# Patient Record
Sex: Female | Born: 1975 | Hispanic: No
Health system: Southern US, Community
[De-identification: ages and names within clinical notes are randomized; demographics above are authoritative.]

## PROBLEM LIST (undated history)

## (undated) DIAGNOSIS — J189 Pneumonia, unspecified organism: Secondary | ICD-10-CM

## (undated) DIAGNOSIS — K7010 Alcoholic hepatitis without ascites: Secondary | ICD-10-CM

## (undated) DIAGNOSIS — A159 Respiratory tuberculosis unspecified: Secondary | ICD-10-CM

---

## 2008-01-31 HISTORY — PX: OTHER SURGICAL HISTORY: SHX169

## 2016-02-14 ENCOUNTER — Ambulatory Visit
Admission: RE | Admit: 2016-02-14 | Discharge: 2016-02-14 | Disposition: A | Discharge status: Home / Self Care | Payer: No Typology Code available for payment source | Source: Ambulatory Visit | Attending: Infectious Disease | Admitting: Infectious Disease

## 2016-02-14 ENCOUNTER — Other Ambulatory Visit: Payer: Self-pay | Admitting: Infectious Disease

## 2016-02-14 DIAGNOSIS — Z0289 Encounter for other administrative examinations: Secondary | ICD-10-CM

## 2017-05-03 ENCOUNTER — Emergency Department (HOSPITAL_COMMUNITY): Payer: Medicaid Other

## 2017-05-03 ENCOUNTER — Inpatient Hospital Stay (HOSPITAL_COMMUNITY)
Admission: EM | Admit: 2017-05-03 | Discharge: 2017-05-07 | DRG: 865 | Disposition: A | Discharge status: Home / Self Care | Payer: Medicaid Other | Attending: Internal Medicine | Admitting: Internal Medicine

## 2017-05-03 ENCOUNTER — Encounter (HOSPITAL_COMMUNITY): Payer: Self-pay | Admitting: Emergency Medicine

## 2017-05-03 DIAGNOSIS — D696 Thrombocytopenia, unspecified: Secondary | ICD-10-CM | POA: Diagnosis present

## 2017-05-03 DIAGNOSIS — Z8611 Personal history of tuberculosis: Secondary | ICD-10-CM

## 2017-05-03 DIAGNOSIS — Z720 Tobacco use: Secondary | ICD-10-CM

## 2017-05-03 DIAGNOSIS — R109 Unspecified abdominal pain: Secondary | ICD-10-CM | POA: Diagnosis present

## 2017-05-03 DIAGNOSIS — D689 Coagulation defect, unspecified: Secondary | ICD-10-CM | POA: Diagnosis present

## 2017-05-03 DIAGNOSIS — K769 Liver disease, unspecified: Secondary | ICD-10-CM | POA: Diagnosis present

## 2017-05-03 DIAGNOSIS — E7220 Disorder of urea cycle metabolism, unspecified: Secondary | ICD-10-CM | POA: Diagnosis present

## 2017-05-03 DIAGNOSIS — E872 Acidosis, unspecified: Secondary | ICD-10-CM

## 2017-05-03 DIAGNOSIS — Z6820 Body mass index (BMI) 20.0-20.9, adult: Secondary | ICD-10-CM

## 2017-05-03 DIAGNOSIS — F1020 Alcohol dependence, uncomplicated: Secondary | ICD-10-CM | POA: Diagnosis present

## 2017-05-03 DIAGNOSIS — W228XXA Striking against or struck by other objects, initial encounter: Secondary | ICD-10-CM | POA: Diagnosis present

## 2017-05-03 DIAGNOSIS — R0789 Other chest pain: Secondary | ICD-10-CM | POA: Diagnosis present

## 2017-05-03 DIAGNOSIS — E874 Mixed disorder of acid-base balance: Secondary | ICD-10-CM | POA: Diagnosis present

## 2017-05-03 DIAGNOSIS — R319 Hematuria, unspecified: Secondary | ICD-10-CM | POA: Diagnosis present

## 2017-05-03 DIAGNOSIS — E781 Pure hyperglyceridemia: Secondary | ICD-10-CM | POA: Diagnosis present

## 2017-05-03 DIAGNOSIS — R7989 Other specified abnormal findings of blood chemistry: Secondary | ICD-10-CM | POA: Diagnosis present

## 2017-05-03 DIAGNOSIS — R402413 Glasgow coma scale score 13-15, at hospital admission: Secondary | ICD-10-CM | POA: Diagnosis present

## 2017-05-03 DIAGNOSIS — D61818 Other pancytopenia: Secondary | ICD-10-CM | POA: Diagnosis present

## 2017-05-03 DIAGNOSIS — E876 Hypokalemia: Secondary | ICD-10-CM | POA: Diagnosis present

## 2017-05-03 DIAGNOSIS — R079 Chest pain, unspecified: Secondary | ICD-10-CM | POA: Diagnosis present

## 2017-05-03 DIAGNOSIS — D735 Infarction of spleen: Secondary | ICD-10-CM | POA: Diagnosis present

## 2017-05-03 DIAGNOSIS — K760 Fatty (change of) liver, not elsewhere classified: Secondary | ICD-10-CM | POA: Diagnosis present

## 2017-05-03 DIAGNOSIS — R748 Abnormal levels of other serum enzymes: Secondary | ICD-10-CM

## 2017-05-03 DIAGNOSIS — A419 Sepsis, unspecified organism: Secondary | ICD-10-CM | POA: Diagnosis present

## 2017-05-03 DIAGNOSIS — R945 Abnormal results of liver function studies: Secondary | ICD-10-CM | POA: Diagnosis present

## 2017-05-03 DIAGNOSIS — G9341 Metabolic encephalopathy: Secondary | ICD-10-CM | POA: Diagnosis present

## 2017-05-03 DIAGNOSIS — R6510 Systemic inflammatory response syndrome (SIRS) of non-infectious origin without acute organ dysfunction: Secondary | ICD-10-CM | POA: Diagnosis present

## 2017-05-03 DIAGNOSIS — R29700 NIHSS score 0: Secondary | ICD-10-CM | POA: Diagnosis present

## 2017-05-03 DIAGNOSIS — E440 Moderate protein-calorie malnutrition: Secondary | ICD-10-CM | POA: Diagnosis present

## 2017-05-03 DIAGNOSIS — B349 Viral infection, unspecified: Principal | ICD-10-CM | POA: Diagnosis present

## 2017-05-03 HISTORY — DX: Respiratory tuberculosis unspecified: A15.9

## 2017-05-03 HISTORY — DX: Pneumonia, unspecified organism: J18.9

## 2017-05-03 LAB — COMPREHENSIVE METABOLIC PANEL
ALBUMIN: 2.7 g/dL — AB (ref 3.5–5.0)
ALK PHOS: 287 U/L — AB (ref 38–126)
ALT: 84 U/L — ABNORMAL HIGH (ref 14–54)
ANION GAP: 11 (ref 5–15)
AST: 301 U/L — ABNORMAL HIGH (ref 15–41)
BUN: 6 mg/dL (ref 6–20)
CALCIUM: 7.8 mg/dL — AB (ref 8.9–10.3)
CO2: 14 mmol/L — AB (ref 22–32)
Chloride: 106 mmol/L (ref 101–111)
Creatinine, Ser: 0.81 mg/dL (ref 0.44–1.00)
GFR calc Af Amer: >60 mL/min (ref >60)
GFR calc non Af Amer: >60 mL/min (ref >60)
Glucose, Bld: 116 mg/dL — ABNORMAL HIGH (ref 65–99)
POTASSIUM: 3.3 mmol/L — AB (ref 3.5–5.1)
SODIUM: 131 mmol/L — AB (ref 135–145)
TOTAL PROTEIN: 6.2 g/dL — AB (ref 6.5–8.1)
Total Bilirubin: 4.4 mg/dL — ABNORMAL HIGH (ref 0.3–1.2)

## 2017-05-03 LAB — AMMONIA: Ammonia: 60 umol/L — ABNORMAL HIGH (ref 9–35)

## 2017-05-03 LAB — I-STAT CG4 LACTIC ACID, ED
LACTIC ACID, VENOUS: 4.01 mmol/L — AB (ref 0.5–1.9)
LACTIC ACID, VENOUS: 2.57 mmol/L — AB (ref 0.5–1.9)
Lactic Acid, Venous: 2.85 mmol/L (ref 0.5–1.9)

## 2017-05-03 LAB — CBC WITH DIFFERENTIAL/PLATELET
BASOS PCT: 1 %
Basophils Absolute: 0.1 10*3/uL (ref 0.0–0.1)
Eosinophils Absolute: 0 10*3/uL (ref 0.0–0.7)
Eosinophils Relative: 0 %
HEMATOCRIT: 32.3 % — AB (ref 36.0–46.0)
HEMOGLOBIN: 11.5 g/dL — AB (ref 12.0–15.0)
LYMPHS PCT: 48 %
Lymphs Abs: 2.3 10*3/uL (ref 0.7–4.0)
MCH: 31.6 pg (ref 26.0–34.0)
MCHC: 35.6 g/dL (ref 30.0–36.0)
MCV: 88.7 fL (ref 78.0–100.0)
MONOS PCT: 12 %
Monocytes Absolute: 0.6 10*3/uL (ref 0.1–1.0)
NEUTROS ABS: 1.9 10*3/uL (ref 1.7–7.7)
Neutrophils Relative %: 39 %
Platelets: 102 10*3/uL — ABNORMAL LOW (ref 150–400)
RBC: 3.64 MIL/uL — ABNORMAL LOW (ref 3.87–5.11)
RDW: 19.9 % — ABNORMAL HIGH (ref 11.5–15.5)
WBC: 4.8 10*3/uL (ref 4.0–10.5)

## 2017-05-03 LAB — I-STAT TROPONIN, ED
TROPONIN I, POC: 0 ng/mL (ref 0.00–0.08)
Troponin i, poc: 0 ng/mL (ref 0.00–0.08)

## 2017-05-03 LAB — I-STAT BETA HCG BLOOD, ED (MC, WL, AP ONLY): I-stat hCG, quantitative: <5 m[IU]/mL (ref <5)

## 2017-05-03 MED ORDER — PIPERACILLIN-TAZOBACTAM 3.375 G IVPB
3.3750 g | Freq: Three times a day (TID) | INTRAVENOUS | Status: DC
  Administered 2017-05-04 (×2): 3.375 g via INTRAVENOUS
  Filled 2017-05-03 (×2): qty 50

## 2017-05-03 MED ORDER — SODIUM CHLORIDE 0.9 % IV BOLUS (SEPSIS)
500.0000 mL | Freq: Once | INTRAVENOUS | Status: AC
  Administered 2017-05-03: 500 mL via INTRAVENOUS

## 2017-05-03 MED ORDER — VANCOMYCIN HCL IN DEXTROSE 1-5 GM/200ML-% IV SOLN
1000.0000 mg | Freq: Once | INTRAVENOUS | Status: AC
  Administered 2017-05-03: 1000 mg via INTRAVENOUS
  Filled 2017-05-03: qty 200

## 2017-05-03 MED ORDER — PIPERACILLIN-TAZOBACTAM 3.375 G IVPB 30 MIN
3.3750 g | Freq: Once | INTRAVENOUS | Status: AC
  Administered 2017-05-03: 3.375 g via INTRAVENOUS
  Filled 2017-05-03: qty 50

## 2017-05-03 MED ORDER — VANCOMYCIN HCL 500 MG IV SOLR
500.0000 mg | Freq: Two times a day (BID) | INTRAVENOUS | Status: DC
  Administered 2017-05-04: 500 mg via INTRAVENOUS
  Filled 2017-05-03 (×3): qty 500

## 2017-05-03 MED ORDER — SODIUM CHLORIDE 0.9 % IV BOLUS (SEPSIS)
1000.0000 mL | Freq: Once | INTRAVENOUS | Status: AC
  Administered 2017-05-03: 1000 mL via INTRAVENOUS

## 2017-05-03 NOTE — ED Notes (Signed)
Dr. Fredderick PhenixBelfi aware of Lactic 4.01

## 2017-05-03 NOTE — ED Notes (Signed)
ED Provider at bedside. 

## 2017-05-03 NOTE — Progress Notes (Addendum)
Pharmacy Antibiotic Note  Susan Mcconnell is a 42 y.o. female admitted on 05/03/2017 with sepsis.  Pharmacy has been consulted for vancomycin and Zosyn dosing.  Tm 99.2, WBC 4.8, LA 4.01  Plan: Vancomycin 1 g IV loading dose Vancomycin 500 mg IV every 12 hours.  Goal trough 15-20 mcg/mL. Zosyn 3.375g IV q8h (4 hour infusion).  F/u cultures, LOT, and VT prn  Height: 5\' 2"  (157.5 cm) Weight: 110 lb (49.9 kg) IBW/kg (Calculated) : 50.1  Temp (24hrs), Avg:98.8 F (37.1 C), Min:98.4 F (36.9 C), Max:99.2 F (37.3 C)  Recent Labs  Lab 05/03/17 2010 05/03/17 2043 05/03/17 2139 05/03/17 2255  WBC 4.8  --   --   --   CREATININE 0.81  --   --   --   LATICACIDVEN  --  4.01* 2.85* 2.57*    Estimated Creatinine Clearance: 72.7 mL/min (by C-G formula based on SCr of 0.81 mg/dL).    No Known Allergies  Antimicrobials this admission: Vancomycin 4/4 >> Zosyn 4/4 >>  Dose adjustments this admission: none  Microbiology results: Bcx collected   Ladell PierBrooke Arnetia Bronk, PharmD Pharmacy Resident Clinical Phone for 05/03/2017 until 3:30pm: [phone] If after 3:30pm, please call main pharmacy at [phone] 05/03/2017 11:04 PM

## 2017-05-03 NOTE — ED Triage Notes (Signed)
Pt is poor historian, speaks [place]. Pt states "I am here because I don't feel good" Pt will not state how/why she doesn't feel good. Only states this has happened before when she lived in [place] and was admitted for IV fluids.

## 2017-05-03 NOTE — ED Provider Notes (Signed)
[facility] EMERGENCY DEPARTMENT Provider Note   CSN: [id number] Arrival date & time: 05/03/17  4098     History   Chief Complaint Chief Complaint  Patient presents with  . Generalized Body Aches    HPI Susan Mcconnell is a 42 y.o. female.  HPI 42 year old female who is a poor historian with history obtained from interpreter presents to the ED for evaluation of "not feeling well".  Patient states that "she does not want to tell me what is going on".  Patient's son in the room is baking patient to tell me what is going on however patient refuses.  She states that "she does not have a job and has no money and just wants IV fluids".  Son does report that patient has been complaining of a fever for the past 4 days.  States that she has had some complaints of lower chest pain and upper abdominal pain.  Patient cannot give me any more information concerning this.  Patient has been living in [place] for the past year.  She is originally from [place].  Patient does state that "she has had tuberculosis before in [place] and was treated several years ago".  Patient denies any new cough.  Denies any urinary symptoms, change in bowel habits.  Denies shortness of breath, vomiting, diarrhea, urinary symptoms.  States that she had to be hospitalized in [place] for same thing several years ago for IV fluids. History reviewed. No pertinent past medical history.  There are no active problems to display for this patient.   History reviewed. No pertinent surgical history.   OB History   None      Home Medications    Prior to Admission medications   Not on File    Family History No family history on file.  Social History Social History   Tobacco Use  . Smoking status: Not on file  Substance Use Topics  . Alcohol use: Not on file  . Drug use: Not on file     Allergies   Patient has no known allergies.   Review of Systems Review of Systems  Constitutional: Positive for  chills and fever.  HENT: Negative for congestion.   Respiratory: Negative for cough and shortness of breath.   Cardiovascular: Positive for chest pain.  Gastrointestinal: Positive for abdominal pain. Negative for diarrhea, nausea and vomiting.  Genitourinary: Negative for dysuria, flank pain, frequency, hematuria and urgency.  Musculoskeletal: Negative for myalgias.  Skin: Negative for rash.  Neurological: Negative for dizziness, weakness, light-headedness and headaches.     Physical Exam Updated Vital Signs BP 97/62   Pulse 99   Temp 98.4 F (36.9 C) (Rectal) Comment: Pt felt extremely warm to touch. Oral temp yielded 98.4 prior to rectal being taken.   Resp (!) 24   Ht 5\' 2"  (1.575 m)   Wt 49.9 kg (110 lb)   SpO2 99%   BMI 20.12 kg/m   Physical Exam  Constitutional: She is oriented to person, place, and time. She appears well-developed and well-nourished.  Non-toxic appearance. No distress.  HENT:  Head: Normocephalic and atraumatic.  Nose: Nose normal.  Mouth/Throat: Oropharynx is clear and moist.  Patient does have what appears to be a healing ecchymosis to the left eyelid.  Eyes: Pupils are equal, round, and reactive to light. Conjunctivae and EOM are normal. Right eye exhibits no discharge. Left eye exhibits no discharge.  Neck: Normal range of motion. Neck supple.  Cardiovascular: Regular rhythm, normal heart sounds  and intact distal pulses. Exam reveals no gallop and no friction rub.  No murmur heard. Tachycardia  Pulmonary/Chest: Effort normal and breath sounds normal. No stridor. No respiratory distress. She has no wheezes. She has no rales. She exhibits no tenderness.  Tachypnea noted.  Patient is not hypoxic.  Abdominal: Soft. Bowel sounds are normal. There is no tenderness. There is no rebound and no guarding.  Musculoskeletal: Normal range of motion. She exhibits no tenderness.  Lymphadenopathy:    She has no cervical adenopathy.  Neurological: She is alert  and oriented to person, place, and time.  Alert and oriented x3.  Follow commands appropriate.  Answers questions appropriately.  Skin: Skin is warm and dry. Capillary refill takes less than 2 seconds.  Psychiatric: Her behavior is normal. Judgment and thought content normal.  Nursing note and vitals reviewed.    ED Treatments / Results  Labs (all labs ordered are listed, but only abnormal results are displayed) Labs Reviewed  COMPREHENSIVE METABOLIC PANEL - Abnormal; Notable for the following components:      Result Value   Sodium 131 (*)    Potassium 3.3 (*)    CO2 14 (*)    Glucose, Bld 116 (*)    Calcium 7.8 (*)    Total Protein 6.2 (*)    Albumin 2.7 (*)    AST 301 (*)    ALT 84 (*)    Alkaline Phosphatase 287 (*)    Total Bilirubin 4.4 (*)    All other components within normal limits  CBC WITH DIFFERENTIAL/PLATELET - Abnormal; Notable for the following components:   RBC 3.64 (*)    Hemoglobin 11.5 (*)    HCT 32.3 (*)    RDW 19.9 (*)    Platelets 102 (*)    All other components within normal limits  URINALYSIS, ROUTINE W REFLEX MICROSCOPIC - Abnormal; Notable for the following components:   Hgb urine dipstick LARGE (*)    Bacteria, UA RARE (*)    Squamous Epithelial / LPF 0-5 (*)    All other components within normal limits  AMMONIA - Abnormal; Notable for the following components:   Ammonia 60 (*)    All other components within normal limits  I-STAT CG4 LACTIC ACID, ED - Abnormal; Notable for the following components:   Lactic Acid, Venous 4.01 (*)    All other components within normal limits  I-STAT CG4 LACTIC ACID, ED - Abnormal; Notable for the following components:   Lactic Acid, Venous 2.85 (*)    All other components within normal limits  I-STAT CG4 LACTIC ACID, ED - Abnormal; Notable for the following components:   Lactic Acid, Venous 2.57 (*)    All other components within normal limits  I-STAT CG4 LACTIC ACID, ED - Abnormal; Notable for the following  components:   Lactic Acid, Venous 2.07 (*)    All other components within normal limits  CULTURE, BLOOD (ROUTINE X 2)  CULTURE, BLOOD (ROUTINE X 2)  LIPASE, BLOOD  INFLUENZA PANEL BY PCR (TYPE A & B)  HEPATITIS PANEL, ACUTE  I-STAT BETA HCG BLOOD, ED (MC, WL, AP ONLY)  I-STAT TROPONIN, ED  I-STAT TROPONIN, ED    EKG None  Radiology Dg Chest 2 View  Result Date: 05/03/2017 CLINICAL DATA:  Chest pain. EXAM: CHEST - 2 VIEW COMPARISON:  Chest x-ray dated February 14, 2016. FINDINGS: The heart size and mediastinal contours are within normal limits. Normal pulmonary vascularity. No focal consolidation, pleural effusion, or pneumothorax. No acute osseous  abnormality. IMPRESSION: No active cardiopulmonary disease. Electronically Signed   By: Obie DredgeWilliam T Derry M.D.   On: 05/03/2017 20:47    Procedures .Critical Care Performed by: Rise MuLeaphart, Kenneth T, PA-C Authorized by: Rise MuLeaphart, Kenneth T, PA-C   Critical care provider statement:    Critical care time (minutes):  45   Critical care was necessary to treat or prevent imminent or life-threatening deterioration of the following conditions:  Sepsis   Critical care was time spent personally by me on the following activities:  Development of treatment plan with patient or surrogate, discussions with consultants, discussions with primary provider, evaluation of patient's response to treatment, examination of patient, obtaining history from patient or surrogate, ordering and performing treatments and interventions, ordering and review of laboratory studies, ordering and review of radiographic studies, pulse oximetry, re-evaluation of patient's condition and review of old charts   (including critical care time)  Medications Ordered in ED Medications  vancomycin (VANCOCIN) 500 mg in sodium chloride 0.9 % 100 mL IVPB (has no administration in time range)  piperacillin-tazobactam (ZOSYN) IVPB 3.375 g (has no administration in time range)  sodium  chloride 0.9 % bolus 1,000 mL (0 mLs Intravenous Stopped 05/03/17 2334)    And  sodium chloride 0.9 % bolus 500 mL (0 mLs Intravenous Stopped 05/03/17 2335)  piperacillin-tazobactam (ZOSYN) IVPB 3.375 g (0 g Intravenous Stopped 05/03/17 2335)  vancomycin (VANCOCIN) IVPB 1000 mg/200 mL premix (0 mg Intravenous Stopped 05/04/17 0014)     Initial Impression / Assessment and Plan / ED Course  I have reviewed the triage vital signs and the nursing notes.  Pertinent labs & imaging results that were available during my care of the patient were reviewed by me and considered in my medical decision making (see chart for details).     Patient presents to the ED with vague complaints of chest pain, "not feeling well".  Patient has not been very forthcoming with presentation.  States that she has been admitted for dehydration in the past.  Patient also reports intermittent fevers.  Patient also states she has a history of tuberculosis several years ago that was treated.  Denies any new cough or weight loss.  Patient initially tachycardic with low-grade temperature.  Patient's blood pressure has been soft in the low 90s systolic.  She is tachypneic.  No hypoxia noted.  Exam patient is is very thin appearing.  Lungs clear to auscultation bilaterally.  No focal abdominal tenderness.  Neurovascularly intact in all extremities.  In triage patient's lactic acid was 4.01.  This improved without any intervention at 2.8.  Patient remains tachycardic and tachypneic.  Code sepsis was initiated.  Patient given 30 cc/kg fluid bolus.  Broad-spectrum antibiotics were initiated.  Blood cultures obtained prior to antibiotic initiation.  Lab work reveals no leukocytosis.  Hemoglobin 11.5.  Negative troponin.  Ammonia mildly elevated at 60.  Patient's liver enzymes are slightly elevated with a total bilirubin of 4.4.  Lipase is normal.  Hepatic function is pending.  UA does not show any signs of infection.  She does have large  amounts of hemoglobin in her urine.  Patient's heart rate has improved with fluids.  Her blood pressures remained stable in the low 90s systolic.  Ultrasound of the abdomen did not reveal any dilated bile duct.  Mild liver infiltrate noted.  No other acute abnormalities noted.  Chest x-ray showed no signs of focal infiltrate or concerning signs of cavitary lesions.  Patient stable at this time with IV fluids and  antibiotics.  Patient is agreed to admission.  Spoke with Dr. Youlanda Roys with hospital medicine who agrees to admission will see patient in the ED and place admission orders.   Final Clinical Impressions(s) / ED Diagnoses   Final diagnoses:  Sepsis, due to unspecified organism [facility])  Elevated liver enzymes    ED Discharge Orders    None       Wallace Keller 05/04/17 4098    Rolan Bucco, MD 05/04/17 1745

## 2017-05-04 ENCOUNTER — Encounter (HOSPITAL_COMMUNITY): Payer: Self-pay | Admitting: Internal Medicine

## 2017-05-04 ENCOUNTER — Inpatient Hospital Stay (HOSPITAL_COMMUNITY): Payer: Medicaid Other

## 2017-05-04 ENCOUNTER — Other Ambulatory Visit: Payer: Self-pay

## 2017-05-04 DIAGNOSIS — R6510 Systemic inflammatory response syndrome (SIRS) of non-infectious origin without acute organ dysfunction: Secondary | ICD-10-CM | POA: Diagnosis present

## 2017-05-04 DIAGNOSIS — A419 Sepsis, unspecified organism: Secondary | ICD-10-CM | POA: Diagnosis present

## 2017-05-04 DIAGNOSIS — E874 Mixed disorder of acid-base balance: Secondary | ICD-10-CM | POA: Diagnosis present

## 2017-05-04 DIAGNOSIS — R748 Abnormal levels of other serum enzymes: Secondary | ICD-10-CM | POA: Diagnosis not present

## 2017-05-04 DIAGNOSIS — E440 Moderate protein-calorie malnutrition: Secondary | ICD-10-CM | POA: Diagnosis present

## 2017-05-04 DIAGNOSIS — G9341 Metabolic encephalopathy: Secondary | ICD-10-CM | POA: Diagnosis present

## 2017-05-04 DIAGNOSIS — E876 Hypokalemia: Secondary | ICD-10-CM | POA: Diagnosis present

## 2017-05-04 DIAGNOSIS — R109 Unspecified abdominal pain: Secondary | ICD-10-CM | POA: Diagnosis present

## 2017-05-04 DIAGNOSIS — E781 Pure hyperglyceridemia: Secondary | ICD-10-CM | POA: Diagnosis present

## 2017-05-04 DIAGNOSIS — K769 Liver disease, unspecified: Secondary | ICD-10-CM | POA: Diagnosis present

## 2017-05-04 DIAGNOSIS — Z6820 Body mass index (BMI) 20.0-20.9, adult: Secondary | ICD-10-CM | POA: Diagnosis not present

## 2017-05-04 DIAGNOSIS — R945 Abnormal results of liver function studies: Secondary | ICD-10-CM | POA: Diagnosis present

## 2017-05-04 DIAGNOSIS — R29700 NIHSS score 0: Secondary | ICD-10-CM | POA: Diagnosis present

## 2017-05-04 DIAGNOSIS — Z720 Tobacco use: Secondary | ICD-10-CM | POA: Diagnosis not present

## 2017-05-04 DIAGNOSIS — K760 Fatty (change of) liver, not elsewhere classified: Secondary | ICD-10-CM | POA: Diagnosis present

## 2017-05-04 DIAGNOSIS — E7220 Disorder of urea cycle metabolism, unspecified: Secondary | ICD-10-CM | POA: Diagnosis present

## 2017-05-04 DIAGNOSIS — R319 Hematuria, unspecified: Secondary | ICD-10-CM | POA: Diagnosis present

## 2017-05-04 DIAGNOSIS — R0789 Other chest pain: Secondary | ICD-10-CM | POA: Diagnosis present

## 2017-05-04 DIAGNOSIS — B349 Viral infection, unspecified: Secondary | ICD-10-CM | POA: Diagnosis present

## 2017-05-04 DIAGNOSIS — D735 Infarction of spleen: Secondary | ICD-10-CM | POA: Diagnosis present

## 2017-05-04 DIAGNOSIS — R7989 Other specified abnormal findings of blood chemistry: Secondary | ICD-10-CM | POA: Diagnosis present

## 2017-05-04 DIAGNOSIS — D696 Thrombocytopenia, unspecified: Secondary | ICD-10-CM

## 2017-05-04 DIAGNOSIS — Z8611 Personal history of tuberculosis: Secondary | ICD-10-CM | POA: Diagnosis not present

## 2017-05-04 DIAGNOSIS — R079 Chest pain, unspecified: Secondary | ICD-10-CM | POA: Diagnosis present

## 2017-05-04 DIAGNOSIS — D689 Coagulation defect, unspecified: Secondary | ICD-10-CM | POA: Diagnosis present

## 2017-05-04 DIAGNOSIS — D61818 Other pancytopenia: Secondary | ICD-10-CM | POA: Diagnosis present

## 2017-05-04 DIAGNOSIS — R402413 Glasgow coma scale score 13-15, at hospital admission: Secondary | ICD-10-CM | POA: Diagnosis present

## 2017-05-04 DIAGNOSIS — F1020 Alcohol dependence, uncomplicated: Secondary | ICD-10-CM | POA: Diagnosis present

## 2017-05-04 DIAGNOSIS — W228XXA Striking against or struck by other objects, initial encounter: Secondary | ICD-10-CM | POA: Diagnosis present

## 2017-05-04 LAB — CBC
HCT: 27.4 % — ABNORMAL LOW (ref 36.0–46.0)
HCT: 29.4 % — ABNORMAL LOW (ref 36.0–46.0)
HEMOGLOBIN: 9.6 g/dL — AB (ref 12.0–15.0)
HEMOGLOBIN: 9.9 g/dL — AB (ref 12.0–15.0)
MCH: 30.7 pg (ref 26.0–34.0)
MCH: 30.1 pg (ref 26.0–34.0)
MCHC: 35 g/dL (ref 30.0–36.0)
MCHC: 33.7 g/dL (ref 30.0–36.0)
MCV: 87.5 fL (ref 78.0–100.0)
MCV: 89.4 fL (ref 78.0–100.0)
PLATELETS: 87 10*3/uL — AB (ref 150–400)
Platelets: 85 10*3/uL — ABNORMAL LOW (ref 150–400)
RBC: 3.13 MIL/uL — AB (ref 3.87–5.11)
RBC: 3.29 MIL/uL — AB (ref 3.87–5.11)
RDW: 20.2 % — ABNORMAL HIGH (ref 11.5–15.5)
RDW: 19.2 % — ABNORMAL HIGH (ref 11.5–15.5)
WBC: 2.2 10*3/uL — AB (ref 4.0–10.5)
WBC: 4.3 10*3/uL (ref 4.0–10.5)

## 2017-05-04 LAB — PROTIME-INR
INR: 1.35
PROTHROMBIN TIME: 16.6 s — AB (ref 11.4–15.2)

## 2017-05-04 LAB — PATHOLOGIST SMEAR REVIEW

## 2017-05-04 LAB — INFLUENZA PANEL BY PCR (TYPE A & B)
Influenza A By PCR: NEGATIVE
Influenza B By PCR: NEGATIVE

## 2017-05-04 LAB — COMPREHENSIVE METABOLIC PANEL
ALK PHOS: 225 U/L — AB (ref 38–126)
ALT: 63 U/L — AB (ref 14–54)
ANION GAP: 14 (ref 5–15)
AST: 207 U/L — ABNORMAL HIGH (ref 15–41)
Albumin: 2.5 g/dL — ABNORMAL LOW (ref 3.5–5.0)
BILIRUBIN TOTAL: 3.7 mg/dL — AB (ref 0.3–1.2)
BUN: 6 mg/dL (ref 6–20)
CALCIUM: 6.4 mg/dL — AB (ref 8.9–10.3)
CO2: 11 mmol/L — ABNORMAL LOW (ref 22–32)
CREATININE: 1.01 mg/dL — AB (ref 0.44–1.00)
Chloride: 108 mmol/L (ref 101–111)
Glucose, Bld: 108 mg/dL — ABNORMAL HIGH (ref 65–99)
Potassium: 3.6 mmol/L (ref 3.5–5.1)
Sodium: 133 mmol/L — ABNORMAL LOW (ref 135–145)
TOTAL PROTEIN: 5.5 g/dL — AB (ref 6.5–8.1)

## 2017-05-04 LAB — SALICYLATE LEVEL: Salicylate Lvl: <7 mg/dL (ref 2.8–30.0)

## 2017-05-04 LAB — TROPONIN I
Troponin I: <0.03 ng/mL (ref <0.03)
Troponin I: <0.03 ng/mL (ref <0.03)

## 2017-05-04 LAB — IRON AND TIBC
IRON: 107 ug/dL (ref 28–170)
Saturation Ratios: 99 % — ABNORMAL HIGH (ref 10.4–31.8)
TIBC: 108 ug/dL — ABNORMAL LOW (ref 250–450)
UIBC: 1 ug/dL

## 2017-05-04 LAB — URINALYSIS, ROUTINE W REFLEX MICROSCOPIC
Bilirubin Urine: NEGATIVE
GLUCOSE, UA: NEGATIVE mg/dL
Ketones, ur: NEGATIVE mg/dL
Leukocytes, UA: NEGATIVE
Nitrite: NEGATIVE
PROTEIN: NEGATIVE mg/dL
SPECIFIC GRAVITY, URINE: 1.005 (ref 1.005–1.030)
pH: 7 (ref 5.0–8.0)

## 2017-05-04 LAB — BLOOD GAS, ARTERIAL
Acid-base deficit: 12.8 mmol/L — ABNORMAL HIGH (ref 0.0–2.0)
BICARBONATE: 11.6 mmol/L — AB (ref 20.0–28.0)
DRAWN BY: 52061
FIO2: 21
O2 Saturation: 97.9 %
PATIENT TEMPERATURE: 98.6
pCO2 arterial: 20.8 mmHg — ABNORMAL LOW (ref 32.0–48.0)
pH, Arterial: 7.367 (ref 7.350–7.450)
pO2, Arterial: 108 mmHg (ref 83.0–108.0)

## 2017-05-04 LAB — RETICULOCYTES
RBC.: 3.32 MIL/uL — AB (ref 3.87–5.11)
Retic Count, Absolute: 76.4 10*3/uL (ref 19.0–186.0)
Retic Ct Pct: 2.3 % (ref 0.4–3.1)

## 2017-05-04 LAB — LIPID PANEL
Cholesterol: 287 mg/dL — ABNORMAL HIGH (ref 0–200)
HDL: <10 mg/dL — ABNORMAL LOW (ref >40)
LDL Cholesterol: UNDETERMINED mg/dL (ref 0–99)
Triglycerides: 1710 mg/dL — ABNORMAL HIGH (ref <150)
VLDL: UNDETERMINED mg/dL (ref 0–40)

## 2017-05-04 LAB — RAPID URINE DRUG SCREEN, HOSP PERFORMED
Amphetamines: NOT DETECTED
BARBITURATES: NOT DETECTED
BENZODIAZEPINES: NOT DETECTED
Cocaine: NOT DETECTED
Opiates: NOT DETECTED
TETRAHYDROCANNABINOL: NOT DETECTED

## 2017-05-04 LAB — BASIC METABOLIC PANEL
ANION GAP: 10 (ref 5–15)
BUN: 5 mg/dL — ABNORMAL LOW (ref 6–20)
CHLORIDE: 115 mmol/L — AB (ref 101–111)
CO2: 10 mmol/L — ABNORMAL LOW (ref 22–32)
Calcium: 5.9 mg/dL — CL (ref 8.9–10.3)
Creatinine, Ser: 0.84 mg/dL (ref 0.44–1.00)
GFR calc non Af Amer: >60 mL/min (ref >60)
Glucose, Bld: 126 mg/dL — ABNORMAL HIGH (ref 65–99)
POTASSIUM: 4.4 mmol/L (ref 3.5–5.1)
SODIUM: 135 mmol/L (ref 135–145)

## 2017-05-04 LAB — LIPASE, BLOOD: Lipase: 50 U/L (ref 11–51)

## 2017-05-04 LAB — APTT: APTT: 32 s (ref 24–36)

## 2017-05-04 LAB — LACTIC ACID, PLASMA
LACTIC ACID, VENOUS: 1.8 mmol/L (ref 0.5–1.9)
LACTIC ACID, VENOUS: 1.8 mmol/L (ref 0.5–1.9)

## 2017-05-04 LAB — MRSA PCR SCREENING: MRSA by PCR: NEGATIVE

## 2017-05-04 LAB — HIV ANTIBODY (ROUTINE TESTING W REFLEX): HIV Screen 4th Generation wRfx: NONREACTIVE

## 2017-05-04 LAB — FERRITIN: Ferritin: 1041 ng/mL — ABNORMAL HIGH (ref 11–307)

## 2017-05-04 LAB — SAVE SMEAR

## 2017-05-04 LAB — CORTISOL-AM, BLOOD: Cortisol - AM: >100 ug/dL — ABNORMAL HIGH (ref 6.7–22.6)

## 2017-05-04 LAB — MAGNESIUM: Magnesium: 1.9 mg/dL (ref 1.7–2.4)

## 2017-05-04 LAB — I-STAT CG4 LACTIC ACID, ED: LACTIC ACID, VENOUS: 2.07 mmol/L — AB (ref 0.5–1.9)

## 2017-05-04 LAB — HEMOGLOBIN A1C
Hgb A1c MFr Bld: 5.7 % — ABNORMAL HIGH (ref 4.8–5.6)
Mean Plasma Glucose: 116.89 mg/dL

## 2017-05-04 LAB — LACTATE DEHYDROGENASE: LDH: 272 U/L — ABNORMAL HIGH (ref 98–192)

## 2017-05-04 LAB — VITAMIN B12: Vitamin B-12: 425 pg/mL (ref 180–914)

## 2017-05-04 LAB — PROCALCITONIN: PROCALCITONIN: 0.9 ng/mL

## 2017-05-04 LAB — CBG MONITORING, ED: Glucose-Capillary: 128 mg/dL — ABNORMAL HIGH (ref 65–99)

## 2017-05-04 LAB — FOLATE: FOLATE: 10.5 ng/mL (ref >5.9)

## 2017-05-04 MED ORDER — ONDANSETRON HCL 4 MG PO TABS: 4.0000 mg | ORAL_TABLET | Freq: Four times a day (QID) | ORAL | Status: DC | PRN

## 2017-05-04 MED ORDER — ZOLPIDEM TARTRATE 5 MG PO TABS: 5.0000 mg | ORAL_TABLET | Freq: Every evening | ORAL | Status: DC | PRN

## 2017-05-04 MED ORDER — BOOST / RESOURCE BREEZE PO LIQD CUSTOM
1.0000 | Freq: Three times a day (TID) | ORAL | Status: DC
  Administered 2017-05-04 – 2017-05-07 (×8): 1 via ORAL

## 2017-05-04 MED ORDER — POTASSIUM CHLORIDE 20 MEQ/15ML (10%) PO SOLN
40.0000 meq | Freq: Once | ORAL | Status: AC
  Administered 2017-05-04: 40 meq via ORAL
  Filled 2017-05-04: qty 30

## 2017-05-04 MED ORDER — HYDROCORTISONE NA SUCCINATE PF 100 MG IJ SOLR
50.0000 mg | Freq: Once | INTRAMUSCULAR | Status: AC
  Administered 2017-05-04: 50 mg via INTRAVENOUS
  Filled 2017-05-04: qty 2

## 2017-05-04 MED ORDER — SODIUM CHLORIDE 0.9 % IV BOLUS
500.0000 mL | Freq: Once | INTRAVENOUS | Status: AC
Start: 2017-05-04 — End: 2017-05-04
  Administered 2017-05-04: 500 mL via INTRAVENOUS

## 2017-05-04 MED ORDER — SODIUM CHLORIDE 0.9 % IV SOLN
INTRAVENOUS | Status: DC
  Administered 2017-05-04 – 2017-05-06 (×5): via INTRAVENOUS

## 2017-05-04 MED ORDER — LACTULOSE 10 GM/15ML PO SOLN
20.0000 g | Freq: Three times a day (TID) | ORAL | Status: DC
  Administered 2017-05-04 (×3): 20 g via ORAL
  Filled 2017-05-04 (×6): qty 30

## 2017-05-04 MED ORDER — IOPAMIDOL (ISOVUE-300) INJECTION 61%
INTRAVENOUS | Status: AC
  Filled 2017-05-04: qty 100

## 2017-05-04 MED ORDER — IOPAMIDOL (ISOVUE-300) INJECTION 61%
INTRAVENOUS | Status: AC
  Administered 2017-05-04: 30 mL
  Filled 2017-05-04: qty 30

## 2017-05-04 MED ORDER — ASPIRIN EC 81 MG PO TBEC
81.0000 mg | DELAYED_RELEASE_TABLET | Freq: Every day | ORAL | Status: DC
  Administered 2017-05-04 – 2017-05-06 (×3): 81 mg via ORAL
  Filled 2017-05-04 (×3): qty 1

## 2017-05-04 MED ORDER — SODIUM CHLORIDE 0.9 % IV SOLN
2.0000 g | Freq: Once | INTRAVENOUS | Status: AC
  Administered 2017-05-04: 2 g via INTRAVENOUS
  Filled 2017-05-04: qty 20

## 2017-05-04 MED ORDER — ALBUMIN HUMAN 25 % IV SOLN
12.5000 g | Freq: Once | INTRAVENOUS | Status: AC
  Administered 2017-05-04: 12.5 g via INTRAVENOUS
  Filled 2017-05-04: qty 50

## 2017-05-04 MED ORDER — SODIUM CHLORIDE 0.9 % IV BOLUS
1000.0000 mL | Freq: Once | INTRAVENOUS | Status: AC
  Administered 2017-05-04: 1000 mL via INTRAVENOUS

## 2017-05-04 MED ORDER — ONDANSETRON HCL 4 MG/2ML IJ SOLN: 4.0000 mg | Freq: Four times a day (QID) | INTRAMUSCULAR | Status: DC | PRN

## 2017-05-04 MED ORDER — LACTULOSE 10 GM/15ML PO SOLN
20.0000 g | Freq: Two times a day (BID) | ORAL | Status: DC
Start: 2017-05-04 — End: 2017-05-04
  Filled 2017-05-04: qty 30

## 2017-05-04 MED ORDER — IBUPROFEN 200 MG PO TABS: 200.0000 mg | ORAL_TABLET | Freq: Four times a day (QID) | ORAL | Status: DC | PRN

## 2017-05-04 MED ORDER — MORPHINE SULFATE (PF) 4 MG/ML IV SOLN: 2.0000 mg | INTRAVENOUS | Status: DC | PRN

## 2017-05-04 MED ORDER — SODIUM CHLORIDE 0.9 % IV SOLN: 1.0000 g | Freq: Once | INTRAVENOUS | Status: DC

## 2017-05-04 MED ORDER — IOPAMIDOL (ISOVUE-300) INJECTION 61%
100.0000 mL | Freq: Once | INTRAVENOUS | Status: AC | PRN
  Administered 2017-05-04: 100 mL via INTRAVENOUS

## 2017-05-04 NOTE — ED Notes (Signed)
MD at bedside over pt's BP. To consult CC.

## 2017-05-04 NOTE — ED Notes (Signed)
Admitting at bedside 

## 2017-05-04 NOTE — Progress Notes (Signed)
CRITICAL VALUE ALERT  Critical Value: CA 6.4  Date & Time Notied:  05/04/2017 1900 Provider Notified:kirby notified   Orders Received/Actions taken: ca previously was 5.9

## 2017-05-04 NOTE — ED Notes (Signed)
MD at bedside for BP management
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
## 2017-05-04 NOTE — ED Notes (Signed)
MD at bedside over pts BP. Orders to come.

## 2017-05-04 NOTE — Progress Notes (Signed)
Corrected calcium 7.6

## 2017-05-04 NOTE — ED Notes (Signed)
Pt is difficult to speak to due to language barrier. Pt not wanting to speak to this nurse through interpretor. Pt tends to be somewhat lethargic and in pain.

## 2017-05-04 NOTE — ED Notes (Signed)
Admitting physician called re: Critical value lactic acid.

## 2017-05-04 NOTE — ED Notes (Signed)
Patient sleeping at this time. Patient does not appear in distress, bed is in low position and both side rails are up.

## 2017-05-04 NOTE — ED Notes (Signed)
Ordered diet tray for pt  

## 2017-05-04 NOTE — ED Notes (Signed)
MD at bedside. 

## 2017-05-04 NOTE — H&P (Addendum)
History and Physical    Susan Mcconnell WUJ:[id number] DOB: [birth year] DOA: 05/03/2017  Referring MD/NP/PA:   PCP: Patient, No Pcp Per   Patient coming from:  The patient is coming from home.  At baseline, pt is independent for most of ADL.     Chief Complaint: abdominal pain, chest pain  HPI: Susan Mcconnell is a 42 y.o. female with medical history significant of tuberculosis which was treated many years ago, who presents with abdominal pain, chest pain.  Pt speaks [name] and the history is obtained through Nurse, learning disability. Pt states that . She has some upper abdominal pain for several days, which is mild, constant,nonradiating. Denies nausea, vomiting or diarrhea. No fever or chills. She also reports lower chest pain. She cannot do detailed information about nature of chest pain. She states that her pain is mild, dull, nonradiating. She does not have cough or shortness rest. No recent long distant traveling. No tenderness in the calvarium. Patient states that she had a subjective fever several days ago, which has resolved. Currently no fever or chills. Patient does not have symptoms of UTI or unilateral weakness. Pt is drowsy, but oriented x 3. Pt has hypotension with Bp 89/61 which is not responding to IVF.  ED Course: pt was found to have WBC 4.8, lactic acid of 4.01, 2.07, negative troponin, lipase 50, negative pregnancy test, negative urinalysis, abnormal liver function with ALP 287, albumin 2.7, AST 301, ALT 84, total bilirubin 4.4, potassium 3.3, creatinine normal, ammonia level 60, Flu PCR negative, temperature normal, tachycardia, tachypnea, oxygen saturation 97% on room air. Patient is admitted to telemetry bed as inpatient. PCCM, Dr. Warrick Parisian was consulted.  Review of Systems:   General: currently no fevers, chills, no body weight gain, has poor appetite, has fatigue HEENT: no blurry vision, hearing changes or sore throat Respiratory: no dyspnea, coughing, wheezing CV: has chest pain, no  palpitations GI: no nausea, vomiting, has abdominal pain, no diarrhea, constipation GU: no dysuria, burning on urination, increased urinary frequency, hematuria  Ext: no leg edema Neuro: no unilateral weakness, numbness, or tingling, no vision change or hearing loss Skin: no rash, no skin tear. MSK: No muscle spasm, no deformity, no limitation of range of movement in spin Heme: No easy bruising.  Travel history: No recent long distant travel.  Allergy: No Known Allergies  Past Medical History:  Diagnosis Date  . TB (tuberculosis)     History reviewed. No pertinent surgical history.  Social History:  reports that she has never smoked. She has never used smokeless tobacco. She reports that she drank alcohol. She reports that she has current or past drug history.  Family History: reviewed with patient, patient states that all family members are healthy.  Prior to Admission medications   Not on File    Physical Exam: Vitals:   05/04/17 0408 05/04/17 0415 05/04/17 0430 05/04/17 0515  BP: (!) 88/60 (!) 82/58 (!) 83/53 (!) 82/53  Pulse:  96 87 94  Resp:  (!) 25 19 18   Temp:      TempSrc:      SpO2:  99% 99% 97%  Weight:      Height:       General: Not in acute distress HEENT:       Eyes: PERRL, EOMI, no scleral icterus.       ENT: No discharge from the ears and nose, no pharynx injection, no tonsillar enlargement.        Neck: No JVD, no bruit, no mass felt.  Heme: No neck lymph node enlargement. Cardiac: S1/S2, RRR, No murmurs, No gallops or rubs. Respiratory: No rales, wheezing, rhonchi or rubs. GI: Soft, nondistended, has mild tender in upper abdomen, no rebound pain, no organomegaly, BS present. GU: No hematuria Ext: No pitting leg edema bilaterally. 2+DP/PT pulse bilaterally. Musculoskeletal: No joint deformities, No joint redness or warmth, no limitation of ROM in spin. Skin: No rashes.  Neuro: drowsy, but oriented X3, cranial nerves II-XII grossly intact, moves all  extremities normally. Psych: Patient is not psychotic, no suicidal or hemocidal ideation.  Labs on Admission: I have personally reviewed following labs and imaging studies  CBC: Recent Labs  Lab 05/03/17 2010  WBC 4.8  NEUTROABS 1.9  HGB 11.5*  HCT 32.3*  MCV 88.7  PLT 102*   Basic Metabolic Panel: Recent Labs  Lab 05/03/17 2010 05/04/17 0230  NA 131*  --   K 3.3*  --   CL 106  --   CO2 14*  --   GLUCOSE 116*  --   BUN 6  --   CREATININE 0.81  --   CALCIUM 7.8*  --   MG  --  1.9   GFR: Estimated Creatinine Clearance: 72.7 mL/min (by C-G formula based on SCr of 0.81 mg/dL). Liver Function Tests: Recent Labs  Lab 05/03/17 2010  AST 301*  ALT 84*  ALKPHOS 287*  BILITOT 4.4*  PROT 6.2*  ALBUMIN 2.7*   Recent Labs  Lab 05/03/17 2348  LIPASE 50   Recent Labs  Lab 05/03/17 2224  AMMONIA 60*   Coagulation Profile: No results for input(s): INR, PROTIME in the last 168 hours. Cardiac Enzymes: Recent Labs  Lab 05/04/17 0230  TROPONINI <0.03   BNP (last 3 results) No results for input(s): PROBNP in the last 8760 hours. HbA1C: Recent Labs    05/04/17 0259  HGBA1C 5.7*   CBG: No results for input(s): GLUCAP in the last 168 hours. Lipid Profile: Recent Labs    05/04/17 0300  CHOL 287*  HDL <10*  LDLCALC UNABLE TO CALCULATE IF TRIGLYCERIDE OVER 400 mg/dL  TRIG 1,6101,710*  CHOLHDL NOT REPORTED DUE TO HIGH TRIGLYCERIDES   Thyroid Function Tests: No results for input(s): TSH, T4TOTAL, FREET4, T3FREE, THYROIDAB in the last 72 hours. Anemia Panel: No results for input(s): VITAMINB12, FOLATE, FERRITIN, TIBC, IRON, RETICCTPCT in the last 72 hours. Urine analysis:    Component Value Date/Time   COLORURINE YELLOW 05/04/2017 0025   APPEARANCEUR CLEAR 05/04/2017 0025   LABSPEC 1.005 05/04/2017 0025   PHURINE 7.0 05/04/2017 0025   GLUCOSEU NEGATIVE 05/04/2017 0025   HGBUR LARGE (A) 05/04/2017 0025   BILIRUBINUR NEGATIVE 05/04/2017 0025   KETONESUR  NEGATIVE 05/04/2017 0025   PROTEINUR NEGATIVE 05/04/2017 0025   NITRITE NEGATIVE 05/04/2017 0025   LEUKOCYTESUR NEGATIVE 05/04/2017 0025   Sepsis Labs: @LABRCNTIP (procalcitonin:4,lacticidven:4) )No results found for this or any previous visit (from the past 240 hour(s)).   Radiological Exams on Admission: Dg Chest 2 View  Result Date: 05/03/2017 CLINICAL DATA:  Chest pain. EXAM: CHEST - 2 VIEW COMPARISON:  Chest x-ray dated February 14, 2016. FINDINGS: The heart size and mediastinal contours are within normal limits. Normal pulmonary vascularity. No focal consolidation, pleural effusion, or pneumothorax. No acute osseous abnormality. IMPRESSION: No active cardiopulmonary disease. Electronically Signed   By: Obie DredgeWilliam T Derry M.D.   On: 05/03/2017 20:47   [place] Abdomen Complete  Result Date: 05/04/2017 CLINICAL DATA:  Chest pain and elevated liver enzymes. EXAM: ABDOMEN ULTRASOUND COMPLETE COMPARISON:  None.  FINDINGS: Gallbladder: No gallstones or wall thickening visualized. No sonographic Murphy sign noted by sonographer. Common bile duct: Diameter: 5.4 mm, normal Liver: Mildly increased liver parenchymal echotexture suggesting mild diffuse fatty infiltration. No focal lesions are identified. Portal vein is patent on color Doppler imaging with normal direction of blood flow towards the liver. IVC: No abnormality visualized. Pancreas: Pancreas is partially obscured by overlying bowel gas and is not well visualized. Spleen: Spleen is poorly visualized due to bowel gas. Spleen is likely atrophic. Right Kidney: Length: 10.6 cm. Echogenicity within normal limits. No mass or hydronephrosis visualized. Left Kidney: Length: 9.4 cm. Echogenicity within normal limits. No mass or hydronephrosis visualized. Abdominal aorta: No aneurysm visualized. Other findings: None. IMPRESSION: 1. No acute process demonstrated in the abdomen. 2. Mild diffuse fatty infiltration of the liver. 3. Limited evaluation of spleen and  pancreas due to bowel gas. Electronically Signed   By: Burman Nieves M.D.   On: 05/04/2017 00:41     EKG: Independently reviewed.  Sinus rhythm, QTC 448, lower voltage, anteroseptal infarction pattern, non-specific T wave change.  Assessment/Plan Principal Problem:   Abdominal pain Active Problems:   SIRS (systemic inflammatory response syndrome) (HCC)   Thrombocytopenia (HCC)   Hypokalemia   Abnormal LFTs   Chest pain   Protein-calorie malnutrition, moderate (HCC)   Acute metabolic encephalopathy   Sepsis (HCC)   Abdominal pain and abnormal LFT: etiology is not clear. Lipase normal. Patient has abnormal liver function, but abdominal ultrasound only showed mild fatty liver. We needed to rule out hepatitis. -will admit to SDU as inpt. -prn Zofran for nausea, morphine for pain -Check hepatitis panel, HIV antibody, UDS  Sepsis vs. SIRS: Patient meet criteria for sepsis with hypotension, tachycardia, tachypnea. No fever or leukocytosis. The source of infection is not clear. Chest x-ray negative. Urinalysis negative. bp has been in SBP 80s, which is not responding to IVF resuscitation. Patient has received total 3 liters of normal saline bolus, but her blood pressure has not improved. Patient was also given one dose of Solu Cortef 50 mg 1. PCCM, Dr. Warrick Parisian was consulted. He recommended to give 12.5 g of albumin by IV. If Bp still not improving-->will need to call back to PCCM. -Empiric antibiotics, vancomycin and Zosyn -f/u Bx -check cortisol leve -12.5 of IV albumin  Addendum: after infusion of albumin, bp has no improvement. I called PCCM again at 6:55. Dr. Warrick Parisian suspects pt may have baseline low Bp, but he recommended to call PCCM again after 7:00.  Thrombocytopenia (HCC): may be related to abnormal liver function, but since patient is drowsy, will eed to rule out TTP -LDH, peripheral smear -f/u by CBC  Chest pain: pt cannot give detailed detailed information about her chest pain.  Patient is low-risk for ACS. No suspicious for PE since patient does not have signs of DVT or recent long distant traveling history. -prn morphine for pain -ASA 81 mg daily -trop x 3  Hypokalemia: K= 3.3 on admission. - Repleted - Check Mg level  Protein-calorie malnutrition, moderate (HCC): -nutrition consult  Acute metabolic encephalopathy: pt is drowsy, but oriented x 3. Differential diagnoses include hepatic encephalopathy, TTP and sepsis. Her ammonia is mildly elevated at 60. -Start lactulose 20 g 3 times a day -Frequent neuro check -f/u peripheral smear to rule out TTP  DVT ppx: SCD Code Status: Full code Family Communication: None at bed side.    Disposition Plan:  Anticipate discharge back to previous home environment Consults called:  PCCM, Dr.  Ogan Admission status:   Inpatient/tele      Date of Service 05/04/2017    Lorretta Harp [facility] Pager [phone]  If 7PM-7AM, please contact night-coverage [email or web address] Password [email or web address] 05/04/2017, 6:11 AM

## 2017-05-04 NOTE — ED Notes (Signed)
Admitting MD at bedside. Interpretor at bedside.

## 2017-05-05 DIAGNOSIS — Z6820 Body mass index (BMI) 20.0-20.9, adult: Secondary | ICD-10-CM

## 2017-05-05 DIAGNOSIS — R233 Spontaneous ecchymoses: Secondary | ICD-10-CM

## 2017-05-05 DIAGNOSIS — F17220 Nicotine dependence, chewing tobacco, uncomplicated: Secondary | ICD-10-CM

## 2017-05-05 DIAGNOSIS — R509 Fever, unspecified: Secondary | ICD-10-CM

## 2017-05-05 DIAGNOSIS — E440 Moderate protein-calorie malnutrition: Secondary | ICD-10-CM

## 2017-05-05 DIAGNOSIS — D61818 Other pancytopenia: Secondary | ICD-10-CM

## 2017-05-05 DIAGNOSIS — K760 Fatty (change of) liver, not elsewhere classified: Secondary | ICD-10-CM

## 2017-05-05 LAB — NA AND K (SODIUM & POTASSIUM), RAND UR
POTASSIUM UR: 5 mmol/L
SODIUM UR: 105 mmol/L

## 2017-05-05 LAB — COMPREHENSIVE METABOLIC PANEL
ALT: 60 U/L — ABNORMAL HIGH (ref 14–54)
AST: 176 U/L — ABNORMAL HIGH (ref 15–41)
Albumin: 2.2 g/dL — ABNORMAL LOW (ref 3.5–5.0)
Alkaline Phosphatase: 199 U/L — ABNORMAL HIGH (ref 38–126)
Anion gap: 9 (ref 5–15)
BUN: <5 mg/dL — ABNORMAL LOW (ref 6–20)
CO2: 13 mmol/L — ABNORMAL LOW (ref 22–32)
Calcium: 7.3 mg/dL — ABNORMAL LOW (ref 8.9–10.3)
Chloride: 114 mmol/L — ABNORMAL HIGH (ref 101–111)
Creatinine, Ser: 0.84 mg/dL (ref 0.44–1.00)
Glucose, Bld: 112 mg/dL — ABNORMAL HIGH (ref 65–99)
POTASSIUM: 3.1 mmol/L — AB (ref 3.5–5.1)
SODIUM: 136 mmol/L (ref 135–145)
Total Bilirubin: 3.1 mg/dL — ABNORMAL HIGH (ref 0.3–1.2)
Total Protein: 4.9 g/dL — ABNORMAL LOW (ref 6.5–8.1)

## 2017-05-05 LAB — CBC
HEMATOCRIT: 25.2 % — AB (ref 36.0–46.0)
HEMOGLOBIN: 8.6 g/dL — AB (ref 12.0–15.0)
MCH: 30.7 pg (ref 26.0–34.0)
MCHC: 34.1 g/dL (ref 30.0–36.0)
MCV: 90 fL (ref 78.0–100.0)
Platelets: 83 10*3/uL — ABNORMAL LOW (ref 150–400)
RBC: 2.8 MIL/uL — AB (ref 3.87–5.11)
RDW: 19.9 % — ABNORMAL HIGH (ref 11.5–15.5)
WBC: 3.7 10*3/uL — AB (ref 4.0–10.5)

## 2017-05-05 LAB — GLUCOSE, CAPILLARY
GLUCOSE-CAPILLARY: 125 mg/dL — AB (ref 65–99)
Glucose-Capillary: 99 mg/dL (ref 65–99)
Glucose-Capillary: 95 mg/dL (ref 65–99)

## 2017-05-05 LAB — HEPATITIS PANEL, ACUTE
HCV Ab: <0.1 s/co ratio (ref 0.0–0.9)
HEP B C IGM: NEGATIVE
Hep A IgM: NEGATIVE
Hepatitis B Surface Ag: NEGATIVE

## 2017-05-05 LAB — MAGNESIUM: Magnesium: 1.7 mg/dL (ref 1.7–2.4)

## 2017-05-05 LAB — AMMONIA: Ammonia: 51 umol/L — ABNORMAL HIGH (ref 9–35)

## 2017-05-05 MED ORDER — ADULT MULTIVITAMIN W/MINERALS CH
1.0000 | ORAL_TABLET | Freq: Every day | ORAL | Status: DC
  Administered 2017-05-05 – 2017-05-07 (×3): 1 via ORAL
  Filled 2017-05-05 (×3): qty 1

## 2017-05-05 MED ORDER — POTASSIUM CHLORIDE CRYS ER 20 MEQ PO TBCR
40.0000 meq | EXTENDED_RELEASE_TABLET | ORAL | Status: AC
  Administered 2017-05-05 (×2): 40 meq via ORAL
  Filled 2017-05-05 (×2): qty 2

## 2017-05-05 NOTE — Consult Note (Signed)
Consult for [facility]  Reason for Consult: Abnormal liver enzymes Referring Physician: [facility]  Susan Mcconnell HPI: This is a 42 year old female originally from [place] admitted for complaints of feeling unwell and abdominal pain.  She does not speak English and the majority of the information has been gleaned from the notes, blood work, and imaging.  During the initial evaluation, her blood work was significant for elevated liver enzymes:  AST 301, ALT 84, AP 287, and TB 4.4.  She was also noted to be hypotensive and there was an elevation in her lactic acid.  With the constellation of symptoms she was thought to have a sepsis presentation and the protocol was initiated. The patient's blood pressure improved with IV hydration and she was emperically treated with antibiotics.  Further work up excluded the possibility of a viral hepatitis as her HBV and HCV were negative.  Imaging of her abdomen did show fatty infiltration of her liver and, per oncology, she has a small spleen.  There was no evidence of cirrhosis or portal HTN.  In the past she was treated for TB and she denies taking any other medications of late.  She admits to drinking 1-1.5 cans of beer per day for the past several months as she lost her job and is currently in a stressful situation.  The patient reported subjective fevers, but there has been no documented fevers in the hospital.  Over the course of these few days her liver enzymes have improved.  Oncology was consulted to evaluate her pancytopenia, but no overt etiology have been identified.  When she was living in [place] a similar event occurred and she reported being treated with IV hydration.  Past Medical History:  Diagnosis Date  . Pneumonia   . TB (tuberculosis) ~ 2017   "took the RX and was healed" (05/04/2017)    Past Surgical History:  Procedure Laterality Date  . OTHER SURGICAL HISTORY  2010   "birth control surgery; don't know what they did"    History  reviewed. No pertinent family history.  Social History:  reports that she has never smoked. Her smokeless tobacco use includes chew. She reports that she drinks about 12.6 oz of alcohol per week. She reports that she does not use drugs.  Allergies: No Known Allergies  Medications:  Scheduled: . aspirin EC  81 mg Oral Daily  . feeding supplement  1 Container Oral TID BM  . multivitamin with minerals  1 tablet Oral Daily   Continuous: . sodium chloride 125 mL/hr at 05/05/17 1026    Results for orders placed or performed during the hospital encounter of 05/03/17 (from the past 24 hour(s))  CBC     Status: Abnormal   Collection Time: 05/04/17  5:05 PM  Result Value Ref Range   WBC 4.3 4.0 - 10.5 K/uL   RBC 3.29 (L) 3.87 - 5.11 MIL/uL   Hemoglobin 9.9 (L) 12.0 - 15.0 g/dL   HCT 16.1 (L) 09.6 - 04.5 %   MCV 89.4 78.0 - 100.0 fL   MCH 30.1 26.0 - 34.0 pg   MCHC 33.7 30.0 - 36.0 g/dL   RDW 40.9 (H) 81.1 - 91.4 %   Platelets 85 (L) 150 - 400 K/uL  Comprehensive metabolic panel     Status: Abnormal   Collection Time: 05/04/17  5:05 PM  Result Value Ref Range   Sodium 133 (L) 135 - 145 mmol/L   Potassium 3.6 3.5 - 5.1 mmol/L   Chloride  108 101 - 111 mmol/L   CO2 11 (L) 22 - 32 mmol/L   Glucose, Bld 108 (H) 65 - 99 mg/dL   BUN 6 6 - 20 mg/dL   Creatinine, Ser 2.131.01 (H) 0.44 - 1.00 mg/dL   Calcium 6.4 (LL) 8.9 - 10.3 mg/dL   Total Protein 5.5 (L) 6.5 - 8.1 g/dL   Albumin 2.5 (L) 3.5 - 5.0 g/dL   AST 086207 (H) 15 - 41 U/L   ALT 63 (H) 14 - 54 U/L   Alkaline Phosphatase 225 (H) 38 - 126 U/L   Total Bilirubin 3.7 (H) 0.3 - 1.2 mg/dL   GFR calc non Af Amer >60 >60 mL/min   GFR calc Af Amer >60 >60 mL/min   Anion gap 14 5 - 15  Vitamin B12     Status: None   Collection Time: 05/04/17  5:05 PM  Result Value Ref Range   Vitamin B-12 425 180 - 914 pg/mL  Folate     Status: None   Collection Time: 05/04/17  5:05 PM  Result Value Ref Range   Folate 10.5 >5.9 ng/mL  Iron and TIBC      Status: Abnormal   Collection Time: 05/04/17  5:05 PM  Result Value Ref Range   Iron 107 28 - 170 ug/dL   TIBC 578108 (L) 469250 - 629450 ug/dL   Saturation Ratios 99 (H) 10.4 - 31.8 %   UIBC 1 ug/dL  Ferritin     Status: Abnormal   Collection Time: 05/04/17  5:05 PM  Result Value Ref Range   Ferritin 1,041 (H) 11 - 307 ng/mL  Reticulocytes     Status: Abnormal   Collection Time: 05/04/17  5:05 PM  Result Value Ref Range   Retic Ct Pct 2.3 0.4 - 3.1 %   RBC. 3.32 (L) 3.87 - 5.11 MIL/uL   Retic Count, Absolute 76.4 19.0 - 186.0 K/uL  Salicylate level     Status: None   Collection Time: 05/04/17  5:05 PM  Result Value Ref Range   Salicylate Lvl <7.0 2.8 - 30.0 mg/dL  MRSA PCR Screening     Status: None   Collection Time: 05/04/17  5:09 PM  Result Value Ref Range   MRSA by PCR NEGATIVE NEGATIVE  Blood gas, arterial     Status: Abnormal   Collection Time: 05/04/17  5:44 PM  Result Value Ref Range   FIO2 21.00    pH, Arterial 7.367 7.350 - 7.450   pCO2 arterial 20.8 (L) 32.0 - 48.0 mmHg   pO2, Arterial 108 83.0 - 108.0 mmHg   Bicarbonate 11.6 (L) 20.0 - 28.0 mmol/L   Acid-base deficit 12.8 (H) 0.0 - 2.0 mmol/L   O2 Saturation 97.9 %   Patient temperature 98.6    Collection site BRACHIAL ARTERY    Drawn by [id number]    Sample type ARTERIAL DRAW   Glucose, capillary     Status: None   Collection Time: 05/05/17  7:41 AM  Result Value Ref Range   Glucose-Capillary 99 65 - 99 mg/dL  Comprehensive metabolic panel     Status: Abnormal   Collection Time: 05/05/17  8:08 AM  Result Value Ref Range   Sodium 136 135 - 145 mmol/L   Potassium 3.1 (L) 3.5 - 5.1 mmol/L   Chloride 114 (H) 101 - 111 mmol/L   CO2 13 (L) 22 - 32 mmol/L   Glucose, Bld 112 (H) 65 - 99 mg/dL   BUN <5 (L) 6 -  20 mg/dL   Creatinine, Ser 1.61 0.44 - 1.00 mg/dL   Calcium 7.3 (L) 8.9 - 10.3 mg/dL   Total Protein 4.9 (L) 6.5 - 8.1 g/dL   Albumin 2.2 (L) 3.5 - 5.0 g/dL   AST 096 (H) 15 - 41 U/L   ALT 60 (H) 14 - 54 U/L    Alkaline Phosphatase 199 (H) 38 - 126 U/L   Total Bilirubin 3.1 (H) 0.3 - 1.2 mg/dL   GFR calc non Af Amer >60 >60 mL/min   GFR calc Af Amer >60 >60 mL/min   Anion gap 9 5 - 15  CBC     Status: Abnormal   Collection Time: 05/05/17  8:08 AM  Result Value Ref Range   WBC 3.7 (L) 4.0 - 10.5 K/uL   RBC 2.80 (L) 3.87 - 5.11 MIL/uL   Hemoglobin 8.6 (L) 12.0 - 15.0 g/dL   HCT 04.5 (L) 40.9 - 81.1 %   MCV 90.0 78.0 - 100.0 fL   MCH 30.7 26.0 - 34.0 pg   MCHC 34.1 30.0 - 36.0 g/dL   RDW 91.4 (H) 78.2 - 95.6 %   Platelets 83 (L) 150 - 400 K/uL  Magnesium     Status: None   Collection Time: 05/05/17  8:08 AM  Result Value Ref Range   Magnesium 1.7 1.7 - 2.4 mg/dL  Ammonia     Status: Abnormal   Collection Time: 05/05/17 10:58 AM  Result Value Ref Range   Ammonia 51 (H) 9 - 35 umol/L  Glucose, capillary     Status: Abnormal   Collection Time: 05/05/17 11:37 AM  Result Value Ref Range   Glucose-Capillary 125 (H) 65 - 99 mg/dL  Na and K (sodium & potassium), rand urine     Status: None   Collection Time: 05/05/17 12:53 PM  Result Value Ref Range   Sodium, Ur 105 mmol/L   Potassium Urine 5 mmol/L     Dg Chest 2 View  Result Date: 05/03/2017 CLINICAL DATA:  Chest pain. EXAM: CHEST - 2 VIEW COMPARISON:  Chest x-ray dated February 14, 2016. FINDINGS: The heart size and mediastinal contours are within normal limits. Normal pulmonary vascularity. No focal consolidation, pleural effusion, or pneumothorax. No acute osseous abnormality. IMPRESSION: No active cardiopulmonary disease. Electronically Signed   By: Obie Dredge M.D.   On: 05/03/2017 20:47   US Abdomen Complete  Result Date: 05/04/2017 CLINICAL DATA:  Chest pain and elevated liver enzymes. EXAM: ABDOMEN ULTRASOUND COMPLETE COMPARISON:  None. FINDINGS: Gallbladder: No gallstones or wall thickening visualized. No sonographic Murphy sign noted by sonographer. Common bile duct: Diameter: 5.4 mm, normal Liver: Mildly increased liver  parenchymal echotexture suggesting mild diffuse fatty infiltration. No focal lesions are identified. Portal vein is patent on color Doppler imaging with normal direction of blood flow towards the liver. IVC: No abnormality visualized. Pancreas: Pancreas is partially obscured by overlying bowel gas and is not well visualized. Spleen: Spleen is poorly visualized due to bowel gas. Spleen is likely atrophic. Right Kidney: Length: 10.6 cm. Echogenicity within normal limits. No mass or hydronephrosis visualized. Left Kidney: Length: 9.4 cm. Echogenicity within normal limits. No mass or hydronephrosis visualized. Abdominal aorta: No aneurysm visualized. Other findings: None. IMPRESSION: 1. No acute process demonstrated in the abdomen. 2. Mild diffuse fatty infiltration of the liver. 3. Limited evaluation of spleen and pancreas due to bowel gas. Electronically Signed   By: Burman Nieves M.D.   On: 05/04/2017 00:41   Ct Abdomen Pelvis  W Contrast  Result Date: 05/04/2017 CLINICAL DATA:  Acute upper abdominal pain. EXAM: CT ABDOMEN AND PELVIS WITH CONTRAST TECHNIQUE: Multidetector CT imaging of the abdomen and pelvis was performed using the standard protocol following bolus administration of intravenous contrast. CONTRAST:  ISOVUE-300 IOPAMIDOL (ISOVUE-300) INJECTION 61% intravenously, 35 mL ISOVUE-300 IOPAMIDOL (ISOVUE-300) INJECTION 61% orally. COMPARISON:  None. FINDINGS: Lower chest: No acute abnormality. Hepatobiliary: No gallstones are noted. Fatty infiltration of the liver is noted. No biliary dilatation is noted. Pancreas: Unremarkable. No pancreatic ductal dilatation or surrounding inflammatory changes. Spleen: Normal in size without focal abnormality. Adrenals/Urinary Tract: Adrenal glands are unremarkable. Kidneys are normal, without renal calculi, focal lesion, or hydronephrosis. Bladder is unremarkable. Stomach/Bowel: Stomach is within normal limits. Appendix appears normal. No evidence of bowel wall  thickening, distention, or inflammatory changes. Vascular/Lymphatic: No significant vascular findings are present. No enlarged abdominal or pelvic lymph nodes. Reproductive: Uterus and bilateral adnexa are unremarkable. Other: No abdominal wall hernia or abnormality. No abdominopelvic ascites. Musculoskeletal: No acute or significant osseous findings. IMPRESSION: Fatty infiltration of the liver. No other abnormality seen in the abdomen or pelvis. Electronically Signed   By: Lupita Raider, M.D.   On: 05/04/2017 12:15    ROS:  As stated above in the HPI otherwise negative.  Blood pressure 103/78, pulse 77, temperature (!) 97.3 F (36.3 C), temperature source Oral, resp. rate (!) 21, height 5\' 2"  (1.575 m), weight 49.9 kg (110 lb), SpO2 100 %.    PE: Gen: NAD, Alert and Oriented HEENT:  /AT, EOMI Neck: Supple, no LAD Lungs: CTA Bilaterally CV: RRR without M/G/R ABM: Soft, NTND, +BS Ext: No C/C/E  Assessment/Plan: 1) Abnormal liver enzymes. 2) Fatty liver. 3) Pancytopenia.   Multiple etiologies can be entertained with her abnormal liver enzymes, but there is a high suspicion of ETOH.  ETOH can cause the fatty liver, pancytopenia, ABM pain, and fever, both subjective and objective, iron overload, and hypertriglyceridemia.  At face value, the amount of ETOH she reports drinking is not enough to cause all these problems, however, patients under estimate the amount of ETOH consumption.  It is reasonable to check serologies for other etiologies as there is a significant language barrier and there are no baseline levels of blood work.   Even with the [name] interpreter it was still difficult to discern the amount of ETOH she consumed, but in a female of her size, lower amounts of ETOH can cause problems.  If ETOH is not the source of her liver issues, she was counseled to avoid ETOH until her liver issues can be resolved.  Plan: 1) Add on ANA, AMA, ASMA, and alpha-1-antitrypsin. 2) Follow liver  panel. Halee Glynn D 05/05/2017, 5:02 PM

## 2017-05-05 NOTE — Progress Notes (Addendum)
Patient placed on protective precautions due to WBC count 3.7

## 2017-05-05 NOTE — Consult Note (Signed)
Renal Service Consult Note [facility]  Susan Mcconnell 05/05/2017 Susan Mcconnell Requesting Physician:  Dr Susan Mcconnell  Reason for Consult:  Metabolic acidosis HPI: The patient is a 42 y.o. year-old female Susan Mcconnell  without sig PMH presented to ED on 4/5 with c/o abd pain and chest pain, and subjective fevers x several days.  In ED pt was drowsy w/ low BP's 89/ 60 which didn't improve w IVF's.  Lab testing in ED showed ^LFT's , alb 2.7, AST 301, ALT 84, tbili 4.4, NH3 60 and normal creatinine.  Abd US showed fatty liver.  Pt was admitted for w/u and started on po lactulose for Susan Mcconnell.            Patient got 3 L NS bolus in the ED w/o improved BP's.  Got one dose IV corticosteroids and started on IV abx.  She was not febrile.  Blood cx's returned negative.  INR was 1.35, Hep A, B and C were negative. HIV was negative. Drug screen was negative. CT abd / pelvis showed "fatty infiltration of the liver. No other abnormality seen in the abdomen or pelvis."  Abd US showed normal portal vein blood flow.  Kidneys were 9- 11 cm and no hydro noted. After admission , WBC/ Hb / plts all dropped.  Seen by hem-onc today, felt that drop in counts was from hydration.  Hem-onc recommended further GI/ liver testing.    Chemistries show non anion gap metabolic acidosis  Na 136  CL 161  CO2 13  AG 9   K 3.1  BUN <5  Cr 0.84 ABG 7.37/ 21/ 108 UA showed > 6-30 rbc, 0-5 wbc, pH 7.0, large Hb, clear, rare bact.   Patient took medications for TB in 2017, hx PNA also.    Spoke w/ pt today w/ phone interpreter Susan Mcconnell and patient denied any hx of kidney disease.  Has has some diarrhea and some episodes of vomiting in the last month.  Did have abd issue in the past w/ distension.  Pt moved her in Nov 2018 , 15 mos ago.  She is divorced, single, lives w/ her 3 children ages 22, 63 and 53.  No use of OTC meds.     Past Medical History  Past Medical History:  Diagnosis Date  . Pneumonia   . TB (tuberculosis) ~ 2017    "took the RX and was healed" (05/04/2017)   Past Surgical History  Past Surgical History:  Procedure Laterality Date  . OTHER SURGICAL HISTORY  2010   "birth control surgery; don't know what they did"   Family History History reviewed. No pertinent family history. Social History  reports that she has never smoked. Her smokeless tobacco use includes chew. She reports that she drinks about 12.6 oz of alcohol per week. She reports that she does not use drugs. Allergies No Known Allergies Home medications Prior to Admission medications   Not on File   Liver Function Tests Recent Labs  Lab 05/03/17 2010 05/04/17 1705 05/05/17 0808  AST 301* 207* 176*  ALT 84* 63* 60*  ALKPHOS 287* 225* 199*  BILITOT 4.4* 3.7* 3.1*  PROT 6.2* 5.5* 4.9*  ALBUMIN 2.7* 2.5* 2.2*   Recent Labs  Lab 05/03/17 2348  LIPASE 50   CBC Recent Labs  Lab 05/03/17 2010 05/04/17 0608 05/04/17 1705 05/05/17 0808  WBC 4.8 2.2* 4.3 3.7*  NEUTROABS 1.9  --   --   --   HGB 11.5* 9.6* 9.9* 8.6*  HCT 32.3* 27.4* 29.4* 25.2*  MCV 88.7 87.5 89.4 90.0  PLT 102* 87* 85* 83*   Basic Metabolic Panel Recent Labs  Lab 05/03/17 2010 05/04/17 0608 05/04/17 1705 05/05/17 0808  NA 131* 135 133* 136  K 3.3* 4.4 3.6 3.1*  CL 106 115* 108 114*  CO2 14* 10* 11* 13*  GLUCOSE 116* 126* 108* 112*  BUN 6 5* 6 <5*  CREATININE 0.81 0.84 1.01* 0.84  CALCIUM 7.8* 5.9* 6.4* 7.3*   Iron/TIBC/Ferritin/ %Sat    Component Value Date/Time   IRON 107 05/04/2017 1705   TIBC 108 (L) 05/04/2017 1705   FERRITIN 1,041 (H) 05/04/2017 1705   IRONPCTSAT 99 (H) 05/04/2017 1705    Vitals:   05/05/17 0753 05/05/17 0800 05/05/17 1000 05/05/17 1200  BP: 103/78     Pulse: 72  77   Resp: (!) 21  (!) 21   Temp:  97.7 F (36.5 C)  (!) 97.3 F (36.3 C)  TempSrc:  Oral  Oral  SpO2: 100%  100%   Weight:      Height:       Exam Gen thin , kind of frail adult female, no distress No rash, cyanosis or gangrene Sclera  anicteric, throat clear  No jvd or bruits, flat neck veins, no LAN Chest clear bilat to bases RRR no MRG Abd soft ntnd no mass or ascites +bs, no HSM GU defer MS no joint effusions or deformity, borderline cachectic Ext no LE or UE edema, no wounds or ulcers  Neuro is alert, Ox 3 , nf    Impression: 1  Metabolic acidosis - non AG, looks chronic.  Work up is to try to define GI losses vs RTA. Urine pH is high at 7 suggesting may have RTA.  Would get urine lytes for urine anion gap.  RTA can be associated w/ autoimmune hepatitis, PBC and etoh/ cryptogenic cirrhosis; Wilson's disease has been assoc w/ prox and distal RTA. the pathophysiology of these associations is not well understood for the most part.  2  Liver disease - ^NH3, ^LFT's/ tbili 3-4, some etoh history, w/u in progress; no ascites or jaundice 3  Pancytopenia - seen by hem-onc, w/u in progress 4  Anorexia/ wt loss - unclear cause   Plan - as above  Susan Moselleob Naydeen Speirs MD [facility] pager [phone]   05/05/2017, 5:03 PM

## 2017-05-05 NOTE — Progress Notes (Signed)
[facility] CONSULT NOTE  Patient Care Team: Patient, No Pcp Per as PCP - General (General Practice)  CHIEF COMPLAINTS/PURPOSE OF CONSULTATION:  Pancytopenia  HISTORY OF PRESENTING ILLNESS:  Susan Mcconnell 42 y.o. female is seen at the request of the hospitalist due to pancytopenia and recent fever. The patient does not speak [place]. Language interpreter to Wi-Fi service/interpreter line is used for [place] language  The patient has been in [place] for a year and a half.  She is originally from [place] She stated she has been healthy all her life but her chart revealed that she had history of tuberculosis, treated in the past She started to complain of feeling unwell for approximately 3-5 days prior to admission with some low-grade fevers She has been complaining of anorexia with poor appetite.  She does not weigh herself but she is confident that she has lost some weight. She denies recent nausea or changes in bowel habits She has never been told that she has abnormal blood work in the past  She was found to have abnormal CBC from admission blood work. I have the opportunity to review her electronic records. On admission, on 05/03/2017, her white blood cell count was 4.8, hemoglobin of 11.5 and platelet count of 103.  Since admission, she has received some fluid resuscitation.  Her blood count fluctuated in the interim. Her white count ranges from 2.2-4.3 Hemoglobin ranges from 8.6-9.9 Platelet count ranges from 83,000 - 87,000 She denies recent bruising/bleeding, such as spontaneous epistaxis, hematuria, melena or hematochezia She denies recent excessive menorrhagia The patient denies history of liver disease, exposure to heparin, history of cardiac murmur/prior cardiovascular surgery or recent new medications She denies prior blood or platelet transfusions She stated she has been "healthy" all her life. She has been somewhat depressed in the last few months due to  difficulty finding a job.   She has been drinking 1-1-1/2 can of beer daily for 4 months She denies significant alcoholism in the past She denies family history of liver disease or blood disorder. She complained of mild joint pain in her hands but denies other form of joint pain.  She denies skin rashes She is noted to have excessive bruising which she claims is new. She stated that the bruise over her left eyelid was due to recent trauma She stated that skin discoloration on her face was present in the past few months but she could not remember for how long. She denies skin itching.  Denies passage of dark urine.  MEDICAL HISTORY:  Past Medical History:  Diagnosis Date  . Pneumonia   . TB (tuberculosis) ~ 2017   "took the RX and was healed" (05/04/2017)    SURGICAL HISTORY: Past Surgical History:  Procedure Laterality Date  . OTHER SURGICAL HISTORY  2010   "birth control surgery; don't know what they did"    SOCIAL HISTORY: Social History   Socioeconomic History  . Marital status: Divorced    Spouse name: Not on file  . Number of children: Not on file  . Years of education: Not on file  . Highest education level: Not on file  Occupational History  . Not on file  Social Needs  . Financial resource strain: Not on file  . Food insecurity:    Worry: Not on file    Inability: Not on file  . Transportation needs:    Medical: Not on file    Non-medical: Not on file  Tobacco Use  . Smoking  status: Never Smoker  . Smokeless tobacco: Current User    Types: Chew  Substance and Sexual Activity  . Alcohol use: Yes    Alcohol/week: 12.6 oz    Types: 21 Cans of beer per week    Comment: 05/04/2017 "1 can, 3 times/day"  . Drug use: Never  . Sexual activity: Not Currently  Lifestyle  . Physical activity:    Days per week: Not on file    Minutes per session: Not on file  . Stress: Not on file  Relationships  . Social connections:    Talks on phone: Not on file    Gets  together: Not on file    Attends religious service: Not on file    Active member of club or organization: Not on file    Attends meetings of clubs or organizations: Not on file    Relationship status: Not on file  . Intimate partner violence:    Fear of current or ex partner: Not on file    Emotionally abused: Not on file    Physically abused: Not on file    Forced sexual activity: Not on file  Other Topics Concern  . Not on file  Social History Narrative  . Not on file   She has 3 children.  Her sister is looking after her children  FAMILY HISTORY: History reviewed. No pertinent family history.  ALLERGIES:  has No Known Allergies.  MEDICATIONS:  Current Facility-Administered Medications  Medication Dose Route Frequency Provider Last Rate Last Dose  . 0.9 %  sodium chloride infusion   Intravenous Continuous Lorretta HarpNiu, Xilin, MD 125 mL/hr at 05/05/17 1026    . aspirin EC tablet 81 mg  81 mg Oral Daily Lorretta HarpNiu, Xilin, MD   81 mg at 05/05/17 1024  . feeding supplement (BOOST / RESOURCE BREEZE) liquid 1 Container  1 Container Oral TID BM Lorretta HarpNiu, Xilin, MD   1 Container at 05/04/17 2134  . ibuprofen (ADVIL,MOTRIN) tablet 200 mg  200 mg Oral Q6H PRN Lorretta HarpNiu, Xilin, MD      . morphine 4 MG/ML injection 2 mg  2 mg Intravenous Q4H PRN Lorretta HarpNiu, Xilin, MD      . ondansetron [facility](ZOFRAN) tablet 4 mg  4 mg Oral Q6H PRN Lorretta HarpNiu, Xilin, MD       Or  . ondansetron [facility](ZOFRAN) injection 4 mg  4 mg Intravenous Q6H PRN Lorretta HarpNiu, Xilin, MD      . potassium chloride SA (K-DUR,KLOR-CON) CR tablet 40 mEq  40 mEq Oral Q4H Calvert Cantorizwan, Saima, MD   40 mEq at 05/05/17 1025    REVIEW OF SYSTEMS:   Eyes: Denies blurriness of vision, double vision or watery eyes Ears, nose, mouth, throat, and face: Denies mucositis or sore throat Respiratory: Denies cough, dyspnea or wheezes Cardiovascular: Denies palpitation, chest discomfort or lower extremity swelling Gastrointestinal:  Denies nausea, heartburn or change in bowel habits Skin: Denies abnormal  skin rashes Lymphatics: Denies new lymphadenopathy Neurological:Denies numbness, tingling or new weaknesses Behavioral/Psych: Mood is stable, no new changes  All other systems were reviewed with the patient and are negative.  PHYSICAL EXAMINATION: ECOG PERFORMANCE STATUS: 1 - Symptomatic but completely ambulatory  Vitals:   05/05/17 1000 05/05/17 1200  BP:    Pulse: 77   Resp: (!) 21   Temp:  (!) 97.3 F (36.3 C)  SpO2: 100%    Filed Weights   05/03/17 1950  Weight: 110 lb (49.9 kg)    GENERAL:alert, no distress and comfortable.  She is  thin but not cachectic SKIN: Mild jaundice noted. Noted skin bruises without petechiae.  Noted skin discoloration on her face EYES: normal, conjunctiva are jaundiced and non-injected OROPHARYNX:no exudate, no erythema and lips, buccal mucosa, and tongue normal  NECK: supple, thyroid normal size, non-tender, without nodularity LYMPH:  no palpable lymphadenopathy in the cervical, axillary or inguinal LUNGS: clear to auscultation and percussion with normal breathing effort HEART: regular rate & rhythm and no murmurs and no lower extremity edema ABDOMEN:abdomen soft, non-tender and normal bowel sounds Musculoskeletal:no cyanosis of digits.  Digital clubbing is present PSYCH: alert & oriented x 3 with fluent speech NEURO: no focal motor/sensory deficits  LABORATORY DATA:  I have reviewed the data as listed CBC Latest Ref Rng & Units 05/05/2017 05/04/2017 05/04/2017  WBC 4.0 - 10.5 K/uL 3.7(L) 4.3 2.2(L)  Hemoglobin 12.0 - 15.0 g/dL 1.6(X) 0.9(U) 0.4(V)  Hematocrit 36.0 - 46.0 % 25.2(L) 29.4(L) 27.4(L)  Platelets 150 - 400 K/uL 83(L) 85(L) 87(L)   I have reviewed her peripheral blood smear.  Target cells are present.  Mild macrocytosis is noted. Normal morphology of white blood cell. Noted reduced platelet.  Platelet clumping is not present. No evidence of schistocytes  RADIOGRAPHIC STUDIES: I have personally reviewed the radiological images as  listed and agreed with the findings in the report. Dg Chest 2 View  Result Date: 05/03/2017 CLINICAL DATA:  Chest pain. EXAM: CHEST - 2 VIEW COMPARISON:  Chest x-ray dated February 14, 2016. FINDINGS: The heart size and mediastinal contours are within normal limits. Normal pulmonary vascularity. No focal consolidation, pleural effusion, or pneumothorax. No acute osseous abnormality. IMPRESSION: No active cardiopulmonary disease. Electronically Signed   By: Obie Dredge M.D.   On: 05/03/2017 20:47   US Abdomen Complete  Result Date: 05/04/2017 CLINICAL DATA:  Chest pain and elevated liver enzymes. EXAM: ABDOMEN ULTRASOUND COMPLETE COMPARISON:  None. FINDINGS: Gallbladder: No gallstones or wall thickening visualized. No sonographic Murphy sign noted by sonographer. Common bile duct: Diameter: 5.4 mm, normal Liver: Mildly increased liver parenchymal echotexture suggesting mild diffuse fatty infiltration. No focal lesions are identified. Portal vein is patent on color Doppler imaging with normal direction of blood flow towards the liver. IVC: No abnormality visualized. Pancreas: Pancreas is partially obscured by overlying bowel gas and is not well visualized. Spleen: Spleen is poorly visualized due to bowel gas. Spleen is likely atrophic. Right Kidney: Length: 10.6 cm. Echogenicity within normal limits. No mass or hydronephrosis visualized. Left Kidney: Length: 9.4 cm. Echogenicity within normal limits. No mass or hydronephrosis visualized. Abdominal aorta: No aneurysm visualized. Other findings: None. IMPRESSION: 1. No acute process demonstrated in the abdomen. 2. Mild diffuse fatty infiltration of the liver. 3. Limited evaluation of spleen and pancreas due to bowel gas. Electronically Signed   By: Burman Nieves M.D.   On: 05/04/2017 00:41   Ct Abdomen Pelvis W Contrast  Result Date: 05/04/2017 CLINICAL DATA:  Acute upper abdominal pain. EXAM: CT ABDOMEN AND PELVIS WITH CONTRAST TECHNIQUE: Multidetector CT  imaging of the abdomen and pelvis was performed using the standard protocol following bolus administration of intravenous contrast. CONTRAST:  ISOVUE-300 IOPAMIDOL (ISOVUE-300) INJECTION 61% intravenously, 35 mL ISOVUE-300 IOPAMIDOL (ISOVUE-300) INJECTION 61% orally. COMPARISON:  None. FINDINGS: Lower chest: No acute abnormality. Hepatobiliary: No gallstones are noted. Fatty infiltration of the liver is noted. No biliary dilatation is noted. Pancreas: Unremarkable. No pancreatic ductal dilatation or surrounding inflammatory changes. Spleen: Normal in size without focal abnormality. Adrenals/Urinary Tract: Adrenal glands are unremarkable. Kidneys are  normal, without renal calculi, focal lesion, or hydronephrosis. Bladder is unremarkable. Stomach/Bowel: Stomach is within normal limits. Appendix appears normal. No evidence of bowel wall thickening, distention, or inflammatory changes. Vascular/Lymphatic: No significant vascular findings are present. No enlarged abdominal or pelvic lymph nodes. Reproductive: Uterus and bilateral adnexa are unremarkable. Other: No abdominal wall hernia or abnormality. No abdominopelvic ascites. Musculoskeletal: No acute or significant osseous findings. IMPRESSION: Fatty infiltration of the liver. No other abnormality seen in the abdomen or pelvis. Electronically Signed   By: Lupita Raider, M.D.   On: 05/04/2017 12:15    ASSESSMENT & PLAN  Acquired pancytopenia with evidence of hematuria We do not have any form of baseline blood work The aute drop of her blood count since admission is likely due to hydration Serum vitamin B12, iron studies and folate were done, consistent with abnormal iron panel She does not need blood transfusion or platelet transfusion due to lack of symptoms Further workup in progress I will order inflammatory markers, hemoglobinopathy and hemolysis workup for further evaluation of anemia The leukopenia could be due to hemodilution versus  undiagnosed autoimmune disease The thrombocytopenia could be due to liver disease  Anorexia Weight loss Liver dysfunction Fatty liver change on CT imaging with abnormal liver enzymes, worrisome for undiagnosed liver disease The target cells seen on her peripheral smear is likely due to small spleen/splenic infarct/chronic liver disease Even though the report on her CT scan stated that the spleen is noted to be normal but has been is actually quite small in size by my own reading Her calculated liver function is compatible with Child Pugh Score B The cause of the abnormal liver function is unknown I recommend consultation to GI specialist for further evaluation Hepatitis panel is noted to be negative I would check hepatic panel tomorrow I will order ceruloplasmin level and hemochromatosis PCR to exclude Wilson's disease or iron overload as a cause of liver disease due to high iron saturation  History of alcoholism It is not clear to me that her alcohol consumption in the last few months would have account for significant liver disease seen She has no signs or symptoms of hepatic encephalopathy or withdrawal symptoms  Moderate protein calorie malnutrition Cause is unknown but could be due to synthetic liver dysfunction Dietitian has been consulted  Bruising She has mild coagulopathy and thrombocytopenia but denies bleeding Observe only for now  Discharge planning Hopefully in the next 48 hours or so I will follow  All questions were answered. The patient knows to call the clinic with any problems, questions or concerns.     Artis Delay, MD 05/05/2017 2:33 PM

## 2017-05-05 NOTE — Progress Notes (Signed)
Patient assessment, medications explained, and education for how to do a clean catch urine with interpreter services, all questions answered.

## 2017-05-05 NOTE — Progress Notes (Signed)
Initial Nutrition Assessment  DOCUMENTATION CODES:   Not applicable(Will continue to monitor)  INTERVENTION:  - Continue Boost Breeze TID, each supplement provides 250 kcal and 9 grams of protein. - Will order daily multivitamin with minerals.  - Continue to encourage PO intakes. - RD will monitor for additional nutrition-related needs.   NUTRITION DIAGNOSIS:   Inadequate oral intake related to acute illness, nausea, vomiting(abdominal pain) as evidenced by per patient/family report, meal completion < 50%.  GOAL:   Patient will meet greater than or equal to 90% of their needs  MONITOR:   PO intake, Supplement acceptance, Weight trends, Labs  REASON FOR ASSESSMENT:   Malnutrition Screening Tool  ASSESSMENT:   41 y/o F from [place] with a h/o TB treated in the past who is presenting with upper abdominal pain for some days, poor appetite, fatigue and subjective fever. Very poor historian.   BMI indicates normal weight. Diet advanced from NPO to CLD yesterday at 4:05 PM and from CLD to Soft today at 9:05 AM. No documented intakes since admission. Talked with RN prior to entering pt's room and she reports that pt has not yet had Boost Breeze supplement and offered for RD to take this in to pt. She also reports that pt informed MD earlier today that she drinks a large can of beer each day.   Spoke with pt using interpreter [phone]). Pt reports she was able to eat a little bit of lunch, mainly rice, from meal tray and that someone brought her food in, of which she ate a few bites. She reports abdominal pain PTA but unable to give time frame and states that pain has subsided with PO intakes. She reports that for a few days PTA she was mainly consuming liquids, and mainly water, with no solid food and thinks this may have led to abdominal pain. She reports that she has been drinking 1 beer/day for the past 4 months and that she experienced N/V with drinking for a few days PTA d/t drinking  on an empty stomach.   Pt was agreeable to trying Boost Breeze. Poured supplement over ice for pt at the end of visit. Pt does not weigh herself and is unsure of UBW or if her weight has changed. No PTA weight hx available in the chart. NFPE outlined below. Pt is small but no wasting present. Ethnicity considered in this assessment. MD note states pt with moderate malnutrition but unable to identify malnutrition based on ASPEN guidelines at this time.   Medications reviewed; 2 g IV Ca gluconate x1 run yesterday. Labs reviewed; CBGs: 128, 99, and 125 mg/dL today, K: 3.1 mmol/L, Cl: 114 mmol/L, BUN: <5 mg/dL, LFTs elevated, Alk Phos elevated, Ca: 7.3 mg/dL, ammonia: 51 umol/L.   IVF: NS @ 125 mL/hr.     NUTRITION - FOCUSED PHYSICAL EXAM:  Completed/assessed with no muscle or fat wasting, no edema at this time.   Diet Order:  DIET SOFT Room service appropriate? Yes; Fluid consistency: Thin  EDUCATION NEEDS:   No education needs have been identified at this time  Skin:  Skin Assessment: Reviewed RN Assessment  Last BM:  4/6  Height:   Ht Readings from Last 1 Encounters:  05/03/17 '5\' 2"'$  (1.575 m)    Weight:   Wt Readings from Last 1 Encounters:  05/03/17 110 lb (49.9 kg)    Ideal Body Weight:  50 kg  BMI:  Body mass index is 20.12 kg/m.  Estimated Nutritional Needs:   Kcal:  1500-1700  Protein:  60-70 grams  Fluid:  >/= 1.8 L/day      Jarome Matin, MS, RD, LDN, [facility] Inpatient Clinical Dietitian Pager # [phone] After hours/weekend pager # [phone]

## 2017-05-06 DIAGNOSIS — Z7289 Other problems related to lifestyle: Secondary | ICD-10-CM

## 2017-05-06 DIAGNOSIS — R791 Abnormal coagulation profile: Secondary | ICD-10-CM

## 2017-05-06 DIAGNOSIS — E781 Pure hyperglyceridemia: Secondary | ICD-10-CM

## 2017-05-06 DIAGNOSIS — E872 Acidosis, unspecified: Secondary | ICD-10-CM

## 2017-05-06 DIAGNOSIS — R319 Hematuria, unspecified: Secondary | ICD-10-CM

## 2017-05-06 LAB — HEPATIC FUNCTION PANEL
ALK PHOS: 204 U/L — AB (ref 38–126)
ALT: 63 U/L — ABNORMAL HIGH (ref 14–54)
AST: 155 U/L — AB (ref 15–41)
Albumin: 2.3 g/dL — ABNORMAL LOW (ref 3.5–5.0)
BILIRUBIN TOTAL: 2.8 mg/dL — AB (ref 0.3–1.2)
Bilirubin, Direct: 1.4 mg/dL — ABNORMAL HIGH (ref 0.1–0.5)
Indirect Bilirubin: 1.4 mg/dL — ABNORMAL HIGH (ref 0.3–0.9)
Total Protein: 5.7 g/dL — ABNORMAL LOW (ref 6.5–8.1)

## 2017-05-06 LAB — BASIC METABOLIC PANEL
Anion gap: 7 (ref 5–15)
BUN: <5 mg/dL — ABNORMAL LOW (ref 6–20)
CHLORIDE: 116 mmol/L — AB (ref 101–111)
CO2: 16 mmol/L — AB (ref 22–32)
CREATININE: 0.66 mg/dL (ref 0.44–1.00)
Calcium: 7.6 mg/dL — ABNORMAL LOW (ref 8.9–10.3)
GFR calc Af Amer: >60 mL/min (ref >60)
GFR calc non Af Amer: >60 mL/min (ref >60)
Glucose, Bld: 92 mg/dL (ref 65–99)
POTASSIUM: 3.3 mmol/L — AB (ref 3.5–5.1)
Sodium: 139 mmol/L (ref 135–145)

## 2017-05-06 LAB — GLUCOSE, CAPILLARY: Glucose-Capillary: 94 mg/dL (ref 65–99)

## 2017-05-06 LAB — CBC WITH DIFFERENTIAL/PLATELET
BASOS PCT: 1 %
Basophils Absolute: 0 10*3/uL (ref 0.0–0.1)
Eosinophils Absolute: 0.1 10*3/uL (ref 0.0–0.7)
Eosinophils Relative: 2 %
HEMATOCRIT: 27.4 % — AB (ref 36.0–46.0)
HEMOGLOBIN: 9.2 g/dL — AB (ref 12.0–15.0)
LYMPHS ABS: 2.4 10*3/uL (ref 0.7–4.0)
Lymphocytes Relative: 51 %
MCH: 30.2 pg (ref 26.0–34.0)
MCHC: 33.6 g/dL (ref 30.0–36.0)
MCV: 89.8 fL (ref 78.0–100.0)
MONOS PCT: 9 %
Monocytes Absolute: 0.4 10*3/uL (ref 0.1–1.0)
NEUTROS ABS: 1.7 10*3/uL (ref 1.7–7.7)
NEUTROS PCT: 37 %
Platelets: 75 10*3/uL — ABNORMAL LOW (ref 150–400)
RBC: 3.05 MIL/uL — ABNORMAL LOW (ref 3.87–5.11)
RDW: 20.1 % — ABNORMAL HIGH (ref 11.5–15.5)
WBC: 4.7 10*3/uL (ref 4.0–10.5)

## 2017-05-06 LAB — C-REACTIVE PROTEIN: CRP: 6 mg/dL — ABNORMAL HIGH (ref <1.0)

## 2017-05-06 LAB — LIPID PANEL
Cholesterol: 349 mg/dL — ABNORMAL HIGH (ref 0–200)
HDL: <10 mg/dL — ABNORMAL LOW (ref >40)
LDL CALC: UNDETERMINED mg/dL (ref 0–99)
TRIGLYCERIDES: 790 mg/dL — AB (ref <150)
VLDL: UNDETERMINED mg/dL (ref 0–40)

## 2017-05-06 LAB — NA AND K (SODIUM & POTASSIUM), RAND UR
Potassium Urine: 5 mmol/L
Sodium, Ur: 79 mmol/L

## 2017-05-06 LAB — LACTATE DEHYDROGENASE: LDH: 200 U/L — ABNORMAL HIGH (ref 98–192)

## 2017-05-06 LAB — DIRECT ANTIGLOBULIN TEST (NOT AT ARMC)
DAT, IgG: NEGATIVE
DAT, complement: NEGATIVE

## 2017-05-06 LAB — SEDIMENTATION RATE: Sed Rate: 10 mm/hr (ref 0–22)

## 2017-05-06 LAB — CHLORIDE, URINE, RANDOM: Chloride Urine: 77 mmol/L

## 2017-05-06 MED ORDER — STERILE WATER FOR INJECTION IV SOLN
INTRAVENOUS | Status: DC
  Administered 2017-05-06 – 2017-05-07 (×3): via INTRAVENOUS
  Filled 2017-05-06 (×5): qty 850

## 2017-05-06 MED ORDER — POTASSIUM CHLORIDE CRYS ER 20 MEQ PO TBCR
40.0000 meq | EXTENDED_RELEASE_TABLET | ORAL | Status: AC
  Administered 2017-05-06 (×2): 40 meq via ORAL
  Filled 2017-05-06 (×2): qty 2

## 2017-05-06 NOTE — Progress Notes (Signed)
Progress Note for [facility]  Subjective: No complaints.  Objective: Vital signs in last 24 hours: Temp:  [97.3 F (36.3 C)-97.9 F (36.6 C)] 97.7 F (36.5 C) (04/07 0500) Pulse Rate:  [66-83] 66 (04/07 0355) Resp:  [17-27] 17 (04/07 0355) BP: (93-102)/(75-82) 93/77 (04/07 0355) SpO2:  [100 %] 100 % (04/07 0355) Last BM Date: 05/05/17  Intake/Output from previous day: 04/06 0701 - 04/07 0700 In: 2031.3 [P.O.:600; I.V.:1431.3] Out: 2900 [Urine:2900] Intake/Output this shift: No intake/output data recorded.  General appearance: alert and no distress GI: soft, non-tender; bowel sounds normal; no masses,  no organomegaly  Lab Results: Recent Labs    05/04/17 1705 05/05/17 0808 05/06/17 0325  WBC 4.3 3.7* 4.7  HGB 9.9* 8.6* 9.2*  HCT 29.4* 25.2* 27.4*  PLT 85* 83* 75*   BMET Recent Labs    05/04/17 0608 05/04/17 1705 05/05/17 0808  NA 135 133* 136  K 4.4 3.6 3.1*  CL 115* 108 114*  CO2 10* 11* 13*  GLUCOSE 126* 108* 112*  BUN 5* 6 <5*  CREATININE 0.84 1.01* 0.84  CALCIUM 5.9* 6.4* 7.3*   LFT Recent Labs    05/06/17 0325  PROT 5.7*  ALBUMIN 2.3*  AST 155*  ALT 63*  ALKPHOS 204*  BILITOT 2.8*  BILIDIR 1.4*  IBILI 1.4*   PT/INR Recent Labs    05/04/17 0817  LABPROT 16.6*  INR 1.35   Hepatitis Panel Recent Labs    05/04/17 0025  HEPBSAG Negative  HCVAB <0.1  HEPAIGM Negative  HEPBIGM Negative   C-Diff No results for input(s): CDIFFTOX in the last 72 hours. Fecal Lactopherrin No results for input(s): FECLLACTOFRN in the last 72 hours.  Studies/Results: Ct Abdomen Pelvis W Contrast  Result Date: 05/04/2017 CLINICAL DATA:  Acute upper abdominal pain. EXAM: CT ABDOMEN AND PELVIS WITH CONTRAST TECHNIQUE: Multidetector CT imaging of the abdomen and pelvis was performed using the standard protocol following bolus administration of intravenous contrast. CONTRAST:  ISOVUE-300 IOPAMIDOL (ISOVUE-300) INJECTION 61% intravenously, 35 mL  ISOVUE-300 IOPAMIDOL (ISOVUE-300) INJECTION 61% orally. COMPARISON:  None. FINDINGS: Lower chest: No acute abnormality. Hepatobiliary: No gallstones are noted. Fatty infiltration of the liver is noted. No biliary dilatation is noted. Pancreas: Unremarkable. No pancreatic ductal dilatation or surrounding inflammatory changes. Spleen: Normal in size without focal abnormality. Adrenals/Urinary Tract: Adrenal glands are unremarkable. Kidneys are normal, without renal calculi, focal lesion, or hydronephrosis. Bladder is unremarkable. Stomach/Bowel: Stomach is within normal limits. Appendix appears normal. No evidence of bowel wall thickening, distention, or inflammatory changes. Vascular/Lymphatic: No significant vascular findings are present. No enlarged abdominal or pelvic lymph nodes. Reproductive: Uterus and bilateral adnexa are unremarkable. Other: No abdominal wall hernia or abnormality. No abdominopelvic ascites. Musculoskeletal: No acute or significant osseous findings. IMPRESSION: Fatty infiltration of the liver. No other abnormality seen in the abdomen or pelvis. Electronically Signed   By: Lupita Raider, M.D.   On: 05/04/2017 12:15    Medications:  Scheduled: . aspirin EC  81 mg Oral Daily  . feeding supplement  1 Container Oral TID BM  . multivitamin with minerals  1 tablet Oral Daily   Continuous: .  sodium bicarbonate (isotonic) infusion in sterile water      Assessment/Plan: 1) Abnormal liver enzymes - ? Etiology. 2) Fatty liver. 3) Metabolic acidosis.   Her liver enzymes have declined and overall she feels well.  She was evaluated by Renal yesterday and work up is in progress.  No complaints of pain or vomiting  at this time.  Plan: 1) Continue work up.  Hepatic serologies obtained with blood draw this AM. 2) Dr. Chales AbrahamsGupta, [facility], will resume care in the AM.  LOS: 2 days   Upton Russey D 05/06/2017, 7:55 AM

## 2017-05-06 NOTE — Progress Notes (Signed)
Susan Mcconnell   DOB:11/12/1975   [id number]    Assessment & Plan:   Acquired pancytopenia with evidence of hematuria We do not have any form of baseline blood work The aute drop of her blood count since admission is likely due to hydration Serum vitamin B12, iron studies and folate were done, consistent with abnormal iron panel She does not need blood transfusion or platelet transfusion due to lack of symptoms Further workup in progress Sed rate is normal but CRP is high Hemoglobinopathy work up is pending Hemolysis workup is negative for DAT The leukopenia could be due to hemodilution versus undiagnosed autoimmune disease The thrombocytopenia could be due to liver disease  Anorexia Weight loss Liver dysfunction Fatty liver change on CT imaging with abnormal liver enzymes, worrisome for undiagnosed liver disease The target cells seen on her peripheral smear is likely due to small spleen/splenic infarct/chronic liver disease Even though the report on her CT scan stated that the spleen is noted to be normal but has been is actually quite small in size by my own reading Appreciate liver consult Hepatitis panel is noted to be negative I would check hepatic panel tomorrow Ceruloplasmin level and hemochromatosis PCR to exclude Wilson's disease or iron overload as a cause of liver disease due to high iron saturation all are pending  History of alcoholism It is not clear to me that her alcohol consumption in the last few months would have account for significant liver disease seen She has no signs or symptoms of hepatic encephalopathy or withdrawal symptoms I have advised the patient to stop alcohol intake after discharge  Moderate protein calorie malnutrition Cause is unknown but could be due to synthetic liver dysfunction Dietitian has been consulted  Bruising She has mild coagulopathy and thrombocytopenia but denies bleeding Observe only for now  Discharge planning Hopefully  tomorrow if repeat blood counts are stable I have tentatively schedule outpatient follow-up next Friday on 05/11/2017.  Written instruction is given to the patient. PLease call if questions arise  Susan Mcconnell Susan Danish, MD 05/06/2017  3:03 PM   Subjective:  Interpreter line is used. She denies feeling unwell or sick. The patient denies any recent signs or symptoms of bleeding such as spontaneous epistaxis, hematuria or hematochezia. Additional workup is pending.  LFTs are stable.  Objective:  Vitals:   05/06/17 0500 05/06/17 1154  BP:  97/75  Pulse:  77  Resp:  18  Temp: 97.7 F (36.5 C) 98.2 F (36.8 C)  SpO2:  100%     Intake/Output Summary (Last 24 hours) at 05/06/2017 1503 Last data filed at 05/06/2017 16100627 Gross per 24 hour  Intake 1431.25 ml  Output 2900 ml  Net -1468.75 ml    GENERAL:alert, no distress and comfortable. Mildly jaundiced NEURO: alert & oriented x 3 with fluent speech, no focal motor/sensory deficits   Labs:  Lab Results  Component Value Date   WBC 4.7 05/06/2017   HGB 9.2 (L) 05/06/2017   HCT 27.4 (L) 05/06/2017   MCV 89.8 05/06/2017   PLT 75 (L) 05/06/2017   NEUTROABS 1.7 05/06/2017    Lab Results  Component Value Date   NA 136 05/05/2017   K 3.1 (L) 05/05/2017   CL 114 (H) 05/05/2017   CO2 13 (L) 05/05/2017

## 2017-05-06 NOTE — Progress Notes (Signed)
[facility] Progress Note  Subjective: no new c/o, eating  Vitals:   05/05/17 2100 05/05/17 2355 05/06/17 0355 05/06/17 0500  BP:  100/75 93/77   Pulse:  72 66   Resp:  (!) 27 17   Temp: 97.9 F (36.6 C)   97.7 F (36.5 C)  TempSrc: Oral   Oral  SpO2:  100% 100%   Weight:      Height:        Inpatient medications: . aspirin EC  81 mg Oral Daily  . feeding supplement  1 Container Oral TID BM  . multivitamin with minerals  1 tablet Oral Daily   .  sodium bicarbonate (isotonic) infusion in sterile water 125 mL/hr at 05/06/17 0824   ibuprofen, morphine injection, ondansetron **OR** ondansetron (ZOFRAN) IV  Exam: Gen thin ,adult female No jvd or bruits, no LAN Chest clear bilat to bases RRR no MRG Abd soft ntnd no mass or ascites +bs, no HSM Ext no LE or UE edema Neuro is alert, Ox 3 , nf    Impression: 1  Metabolic acidosis - non AG, looks chronic.  Awaiting urine lytes.  Possible RTA.  Have switched NS to bicarb gtt.  RTA has been associated w/ various types of liver disease and/or cirrhosis. Will follow.   2  Liver disease - ^NH3, ^LFT's/ tbili 3-4, some etoh history, w/u in progress; no ascites 3  Pancytopenia - seen by hem-onc, w/u in progress 4  Anorexia/ wt loss - unclear cause   Plan - as above   Vinson Moselleob Mayvis Agudelo MD [facility] pager [phone]   05/06/2017, 10:26 AM   Recent Labs  Lab 05/04/17 0608 05/04/17 1705 05/05/17 0808  NA 135 133* 136  K 4.4 3.6 3.1*  CL 115* 108 114*  CO2 10* 11* 13*  GLUCOSE 126* 108* 112*  BUN 5* 6 <5*  CREATININE 0.84 1.01* 0.84  CALCIUM 5.9* 6.4* 7.3*   Recent Labs  Lab 05/04/17 1705 05/05/17 0808 05/06/17 0325  AST 207* 176* 155*  ALT 63* 60* 63*  ALKPHOS 225* 199* 204*  BILITOT 3.7* 3.1* 2.8*  PROT 5.5* 4.9* 5.7*  ALBUMIN 2.5* 2.2* 2.3*   Recent Labs  Lab 05/03/17 2010  05/04/17 1705 05/05/17 0808 05/06/17 0325  WBC 4.8   < > 4.3 3.7* 4.7  NEUTROABS 1.9  --   --   --   1.7  HGB 11.5*   < > 9.9* 8.6* 9.2*  HCT 32.3*   < > 29.4* 25.2* 27.4*  MCV 88.7   < > 89.4 90.0 89.8  PLT 102*   < > 85* 83* 75*   < > = values in this interval not displayed.   Iron/TIBC/Ferritin/ %Sat    Component Value Date/Time   IRON 107 05/04/2017 1705   TIBC 108 (L) 05/04/2017 1705   FERRITIN 1,041 (H) 05/04/2017 1705   IRONPCTSAT 99 (H) 05/04/2017 1705
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
## 2017-05-07 ENCOUNTER — Telehealth: Payer: Self-pay | Admitting: Hematology and Oncology

## 2017-05-07 DIAGNOSIS — R945 Abnormal results of liver function studies: Secondary | ICD-10-CM

## 2017-05-07 DIAGNOSIS — D61818 Other pancytopenia: Secondary | ICD-10-CM

## 2017-05-07 DIAGNOSIS — E781 Pure hyperglyceridemia: Secondary | ICD-10-CM

## 2017-05-07 LAB — BLOOD GAS, ARTERIAL
ACID-BASE EXCESS: 11.2 mmol/L — AB (ref 0.0–2.0)
Bicarbonate: 34.6 mmol/L — ABNORMAL HIGH (ref 20.0–28.0)
DRAWN BY: 280981
FIO2: 21
O2 SAT: 98.6 %
PATIENT TEMPERATURE: 98.6
pCO2 arterial: 40.1 mmHg (ref 32.0–48.0)
pH, Arterial: 7.545 — ABNORMAL HIGH (ref 7.350–7.450)
pO2, Arterial: 96.6 mmHg (ref 83.0–108.0)

## 2017-05-07 LAB — CBC WITH DIFFERENTIAL/PLATELET
BASOS PCT: 1 %
Basophils Absolute: 0 10*3/uL (ref 0.0–0.1)
EOS ABS: 0.1 10*3/uL (ref 0.0–0.7)
Eosinophils Relative: 1 %
HEMATOCRIT: 26.8 % — AB (ref 36.0–46.0)
HEMOGLOBIN: 8.7 g/dL — AB (ref 12.0–15.0)
LYMPHS ABS: 2.3 10*3/uL (ref 0.7–4.0)
Lymphocytes Relative: 53 %
MCH: 29.9 pg (ref 26.0–34.0)
MCHC: 32.5 g/dL (ref 30.0–36.0)
MCV: 92.1 fL (ref 78.0–100.0)
Monocytes Absolute: 0.5 10*3/uL (ref 0.1–1.0)
Monocytes Relative: 11 %
NEUTROS ABS: 1.5 10*3/uL — AB (ref 1.7–7.7)
Neutrophils Relative %: 34 %
Platelets: 77 10*3/uL — ABNORMAL LOW (ref 150–400)
RBC: 2.91 MIL/uL — AB (ref 3.87–5.11)
RDW: 19.9 % — ABNORMAL HIGH (ref 11.5–15.5)
WBC: 4.4 10*3/uL (ref 4.0–10.5)

## 2017-05-07 LAB — COMPREHENSIVE METABOLIC PANEL
ALBUMIN: 2.1 g/dL — AB (ref 3.5–5.0)
ALK PHOS: 167 U/L — AB (ref 38–126)
ALT: 50 U/L (ref 14–54)
ANION GAP: 8 (ref 5–15)
AST: 108 U/L — ABNORMAL HIGH (ref 15–41)
BILIRUBIN TOTAL: 2 mg/dL — AB (ref 0.3–1.2)
BUN: <5 mg/dL — ABNORMAL LOW (ref 6–20)
CHLORIDE: 99 mmol/L — AB (ref 101–111)
CO2: 29 mmol/L (ref 22–32)
Calcium: 7.4 mg/dL — ABNORMAL LOW (ref 8.9–10.3)
Creatinine, Ser: 0.65 mg/dL (ref 0.44–1.00)
GFR calc Af Amer: >60 mL/min (ref >60)
GFR calc non Af Amer: >60 mL/min (ref >60)
Glucose, Bld: 156 mg/dL — ABNORMAL HIGH (ref 65–99)
POTASSIUM: 3.7 mmol/L (ref 3.5–5.1)
SODIUM: 136 mmol/L (ref 135–145)
TOTAL PROTEIN: 5 g/dL — AB (ref 6.5–8.1)

## 2017-05-07 LAB — GLUCOSE, CAPILLARY: GLUCOSE-CAPILLARY: 97 mg/dL (ref 65–99)

## 2017-05-07 LAB — ANTINUCLEAR ANTIBODIES, IFA: ANA Ab, IFA: NEGATIVE

## 2017-05-07 LAB — BASIC METABOLIC PANEL
Anion gap: 11 (ref 5–15)
BUN: <5 mg/dL — ABNORMAL LOW (ref 6–20)
CO2: 29 mmol/L (ref 22–32)
CREATININE: 0.8 mg/dL (ref 0.44–1.00)
Calcium: 7.5 mg/dL — ABNORMAL LOW (ref 8.9–10.3)
Chloride: 98 mmol/L — ABNORMAL LOW (ref 101–111)
GFR calc Af Amer: >60 mL/min (ref >60)
GLUCOSE: 109 mg/dL — AB (ref 65–99)
Potassium: 3.3 mmol/L — ABNORMAL LOW (ref 3.5–5.1)
SODIUM: 138 mmol/L (ref 135–145)

## 2017-05-07 LAB — PROTIME-INR
INR: 1.02
PROTHROMBIN TIME: 13.3 s (ref 11.4–15.2)

## 2017-05-07 LAB — ALPHA-1-ANTITRYPSIN: A1 ANTITRYPSIN SER: 112 mg/dL (ref 90–200)

## 2017-05-07 MED ORDER — SODIUM BICARBONATE 650 MG PO TABS: 650.0000 mg | ORAL_TABLET | Freq: Two times a day (BID) | ORAL | Status: DC
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
## 2017-05-07 NOTE — Telephone Encounter (Signed)
Used language line to speak to patient regarding upcoming April appointments per 4/7 sch message.

## 2017-05-07 NOTE — Progress Notes (Signed)
Susan Mcconnell to be D/C'd Home per MD order.  Discussed with the patient and all questions fully answered.  VSS, Skin clean, dry and intact without evidence of skin break down, no evidence of skin tears noted. IV catheter discontinued intact. Site without signs and symptoms of complications. Dressing and pressure applied.  An After Visit Summary was printed and given to the patient. Patient received prescription.  D/c education completed with patient/family including follow up instructions, medication list, d/c activities limitations if indicated, with other d/c instructions as indicated by MD - patient able to verbalize understanding, all questions fully answered.   Patient instructed to return to ED, call 911, or call MD for any changes in condition.   Patient awaiting her ride.   [name] 05/07/2017 6:05 PM

## 2017-05-07 NOTE — Progress Notes (Addendum)
[facility] Progress Note   Chief Complaint:   Abnormal liver chemistries   SUBJECTIVE:       ASSESSMENT AND PLAN:   42 yo non-English speaking female from [place] with abnormal liver chemistries and hx of ETOH (unclear amount) and abdominal discomfort. Liver chemistries improving. Imaging studies unrevealing except probably fatty liver disease.  -Acute hepatitis panel negative. Urine tox screen negative. -Ferritin markedly elevated at 1041 / saturation ratio high, could be ETOH but HH panel pending.   -autoimmune / genetic markers pending.  -she is smiling today, denies abdominal discomfort. Liver labs improving. Ate some of her breakfast. We can follow up with her outpatient   Attending physician's note   I have taken an interval history, reviewed the chart and examined the patient. I agree with the Advanced Practitioner's note, impression and recommendations.   42 year old with abnormal liver function tests with history of  ? alcohol abuse.  Imaging studies suggest fatty liver.  She has negative acute hepatitis panel.  Autoimmune hepatitis tests, HFE gene testing and other workup is pending.  I have advised her to abstain from alcohol.  She will follow-up in the GI clinic as an outpatient.  If the above tests are unremarkable and she continues to have abnormal liver function tests, then would suggest liver biopsy.  Hepatic elastography may be somewhat helpful.  Edman Circle, MD    OBJECTIVE:     Vital signs in last 24 hours: Temp:  [97.6 F (36.4 C)-99.5 F (37.5 C)] 98 F (36.7 C) (04/08 0745) Pulse Rate:  [73-107] 76 (04/08 0737) Resp:  [17-26] 17 (04/08 0737) BP: (72-114)/(58-80) 72/58 (04/08 0737) SpO2:  [98 %-100 %] 100 % (04/08 0737) Last BM Date: 05/06/17 General:   Alert, well-developed, female in NAD EENT:  Normal hearing, non icteric sclera, conjunctive pink.  Heart:  Regular rate and rhythm; no murmurs. No lower extremity edema Pulm: Normal  respiratory effort, lungs CTA bilaterally without wheezes or crackles. Abdomen:  Soft, nondistended, nontender.  Normal bowel sounds, no masses felt. No hepatomegaly.    Neurologic:  Alert and  oriented x4;  grossly normal neurologically. Psych:  Pleasant, cooperative.  Normal mood and affect.   Intake/Output from previous day: 04/07 0701 - 04/08 0700 In: 1377.1 [I.V.:1377.1] Out: 2700 [Urine:2700] Intake/Output this shift: Total I/O In: -  Out: 300 [Urine:300]  Lab Results: Recent Labs    05/05/17 0808 05/06/17 0325 05/07/17 0345  WBC 3.7* 4.7 4.4  HGB 8.6* 9.2* 8.7*  HCT 25.2* 27.4* 26.8*  PLT 83* 75* 77*   BMET Recent Labs    05/05/17 0808 05/06/17 0325 05/07/17 0345  NA 136 139 136  K 3.1* 3.3* 3.7  CL 114* 116* 99*  CO2 13* 16* 29  GLUCOSE 112* 92 156*  BUN <5* <5* <5*  CREATININE 0.84 0.66 0.65  CALCIUM 7.3* 7.6* 7.4*   LFT Recent Labs    05/06/17 0325 05/07/17 0345  PROT 5.7* 5.0*  ALBUMIN 2.3* 2.1*  AST 155* 108*  ALT 63* 50  ALKPHOS 204* 167*  BILITOT 2.8* 2.0*  BILIDIR 1.4*  --   IBILI 1.4*  --    PT/INR Recent Labs    05/07/17 0345  LABPROT 13.3  INR 1.02    Discharge Planning Diet : as tolerated Discharge Medications: no new GI meds Follow up: our office will call her with appt. We will need to arrange for an interpreter   Active Problems:   Thrombocytopenia (HCC)   Hypokalemia  Abnormal LFTs   Chest pain   Protein-calorie malnutrition, moderate (HCC)   Pancytopenia (HCC)   Hypertriglyceridemia   Compensated metabolic acidosis     LOS: 3 days   Willette ClusterPaula Guenther ,NP 05/07/2017, 9:24 AM  Pager number [phone]

## 2017-05-07 NOTE — Progress Notes (Signed)
Patient ID: Rada HayDhan Cadenhead, female   DOB: 06/20/1975, 42 y.o.   MRN: [id number]  [facility] Progress Note   Assessment/ Plan:   1. Non-anion gap metabolic acidosis: (Over) corrected with sodium bicarbonate drip- now alkalemic. Will stop bicarb drip and assess labs later today/tomorrow AM. Looking at her initial ABG- looks like she has a prominent respiratory alkalosis (from hepatic source??) that indeed may be the driver of her metabolic acidosis. Would not be too aggressive with raising serum bicarbonate levels at this time.   2. Acute hepatitis: possible EtOH versus ongoing evaluation for alternate etiologies with OP GI f/u planned. She has a synthetic defect that may indicate a more prolonged injury. 3. Pancytopenia: seen by hematology with ongoing work up and OP f/u planned.  4. Anorexia/weight loss: ongoing evaluation  Subjective:   Denies any complaints including abdominal pain and wants to go home   Objective:   BP (!) 72/58   Pulse 76   Temp 98 F (36.7 C) (Oral)   Resp 17   Ht 5\' 2"  (1.575 m)   Wt 49.9 kg (110 lb)   SpO2 100%   BMI 20.12 kg/m   Intake/Output Summary (Last 24 hours) at 05/07/2017 1234 Last data filed at 05/07/2017 1129 Gross per 24 hour  Intake 1377.08 ml  Output 3800 ml  Net -2422.92 ml   Weight change:   Physical Exam: WUJ:WJXBJYNWGNFGen:Comfortably resting in bed.  AOZ:HYQMVCVS:Pulse regular rhythm and normal rate, Normal S1 and S2 Resp:Clear to auscultation bilaterally, no rales/rhonchi HQI:ONGEAbd:Soft, flat, non-tender, BS normal Ext:No LE edema.   Imaging: No results found.  Labs: BMET Recent Labs  Lab 05/03/17 2010 05/04/17 0608 05/04/17 1705 05/05/17 0808 05/06/17 0325 05/07/17 0345  NA 131* 135 133* 136 139 136  K 3.3* 4.4 3.6 3.1* 3.3* 3.7  CL 106 115* 108 114* 116* 99*  CO2 14* 10* 11* 13* 16* 29  GLUCOSE 116* 126* 108* 112* 92 156*  BUN 6 5* 6 <5* <5* <5*  CREATININE 0.81 0.84 1.01* 0.84 0.66 0.65  CALCIUM 7.8* 5.9* 6.4* 7.3* 7.6* 7.4*    CBC Recent Labs  Lab 05/03/17 2010  05/04/17 1705 05/05/17 0808 05/06/17 0325 05/07/17 0345  WBC 4.8   < > 4.3 3.7* 4.7 4.4  NEUTROABS 1.9  --   --   --  1.7 1.5*  HGB 11.5*   < > 9.9* 8.6* 9.2* 8.7*  HCT 32.3*   < > 29.4* 25.2* 27.4* 26.8*  MCV 88.7   < > 89.4 90.0 89.8 92.1  PLT 102*   < > 85* 83* 75* 77*   < > = values in this interval not displayed.    Medications:    . feeding supplement  1 Container Oral TID BM  . multivitamin with minerals  1 tablet Oral Daily      Zetta BillsJay Atzin Buchta, MD 05/07/2017, 12:34 PM

## 2017-05-08 LAB — HEMOGLOBINOPATHY EVALUATION
HGB F QUANT: 0 % (ref 0.0–2.0)
HGB S QUANTITAION: 0 %
Hgb A2 Quant: 2.4 % (ref 1.8–3.2)
Hgb A: 97.6 % (ref 96.4–98.8)
Hgb C: 0 %
Hgb Variant: 0 %

## 2017-05-08 LAB — ANTI-SMOOTH MUSCLE ANTIBODY, IGG: F-ACTIN AB IGG: 17 U (ref 0–19)

## 2017-05-08 LAB — CULTURE, BLOOD (ROUTINE X 2)
CULTURE: NO GROWTH
Culture: NO GROWTH
Special Requests: ADEQUATE
Special Requests: ADEQUATE

## 2017-05-08 LAB — MITOCHONDRIAL ANTIBODIES: Mitochondrial M2 Ab, IgG: <20 Units (ref 0.0–20.0)

## 2017-05-08 LAB — CERULOPLASMIN: Ceruloplasmin: 12 mg/dL — ABNORMAL LOW (ref 19.0–39.0)

## 2017-05-08 LAB — ANA W/REFLEX IF POSITIVE: Anti Nuclear Antibody(ANA): NEGATIVE

## 2017-05-10 ENCOUNTER — Other Ambulatory Visit: Payer: Self-pay | Admitting: Hematology and Oncology

## 2017-05-10 DIAGNOSIS — D61818 Other pancytopenia: Secondary | ICD-10-CM

## 2017-05-10 DIAGNOSIS — R748 Abnormal levels of other serum enzymes: Secondary | ICD-10-CM

## 2017-05-11 ENCOUNTER — Encounter: Payer: Self-pay | Admitting: Hematology and Oncology

## 2017-05-11 ENCOUNTER — Other Ambulatory Visit: Payer: Self-pay

## 2017-05-11 ENCOUNTER — Ambulatory Visit: Payer: Self-pay | Admitting: Hematology and Oncology

## 2017-05-11 LAB — HEMOCHROMATOSIS DNA-PCR(C282Y,H63D)

## 2017-05-11 LAB — HUMAN PARVOVIRUS DNA DETECTION BY PCR: Parvovirus B19, PCR: NEGATIVE

## 2017-05-14 ENCOUNTER — Telehealth: Payer: Self-pay

## 2017-05-14 NOTE — Telephone Encounter (Signed)
-----   Message from Meredith PelPaula M Guenther, NP sent at 05/14/2017 11:54 AM EDT ----- I saw her in hospital, Dr. Elnoria HowardHung was covering our patient over weekend and that is why he saw her. Interpreter on a stick is fine. Thanks ----- Message ----- From: Evalee JeffersonMcKew, Chinita Schimpf A, LPN Sent: 9/3/81824/08/2017  12:11 PM To: Meredith PelPaula M Guenther, NP  I am confused. She is an inpatient currently. Dr Elnoria HowardHung has seen her. ----- Message ----- From: Meredith PelGuenther, Paula M, NP Sent: 05/07/2017  10:11 AM To: Austin MilesElizabeth A Genita Nilsson, LPN  Good morning,  Will you please get her a follow up to see me in a couple of weeks. She will need an interpreter, from [place]. Thanks

## 2017-05-14 NOTE — Telephone Encounter (Signed)
The patient speaks [name] per hospital note. Using the telephonic interpreting service a message has been left that I am calling to schedule her an appointment for follow up from hospitalization. Left a message with office information and purpose of the call. Requested a call back.

## 2017-05-28 ENCOUNTER — Telehealth: Payer: Self-pay

## 2017-05-28 NOTE — Telephone Encounter (Signed)
I sent her a letter asking her to call the office.

## 2017-06-12 ENCOUNTER — Ambulatory Visit (HOSPITAL_COMMUNITY)
Admission: EM | Admit: 2017-06-12 | Discharge: 2017-06-12 | Disposition: A | Discharge status: Home / Self Care | Payer: Self-pay

## 2017-06-12 ENCOUNTER — Emergency Department (HOSPITAL_COMMUNITY): Payer: Medicaid Other

## 2017-06-12 ENCOUNTER — Other Ambulatory Visit: Payer: Self-pay

## 2017-06-12 ENCOUNTER — Encounter (HOSPITAL_COMMUNITY): Payer: Self-pay | Admitting: Emergency Medicine

## 2017-06-12 ENCOUNTER — Emergency Department (HOSPITAL_COMMUNITY)
Admission: EM | Admit: 2017-06-12 | Discharge: 2017-06-12 | Disposition: A | Discharge status: Home / Self Care | Payer: Medicaid Other | Attending: Emergency Medicine | Admitting: Emergency Medicine

## 2017-06-12 DIAGNOSIS — K7010 Alcoholic hepatitis without ascites: Secondary | ICD-10-CM | POA: Diagnosis not present

## 2017-06-12 DIAGNOSIS — F17220 Nicotine dependence, chewing tobacco, uncomplicated: Secondary | ICD-10-CM | POA: Insufficient documentation

## 2017-06-12 DIAGNOSIS — R17 Unspecified jaundice: Secondary | ICD-10-CM | POA: Diagnosis present

## 2017-06-12 LAB — URINALYSIS, ROUTINE W REFLEX MICROSCOPIC
Bilirubin Urine: NEGATIVE
Glucose, UA: NEGATIVE mg/dL
Hgb urine dipstick: NEGATIVE
KETONES UR: NEGATIVE mg/dL
Nitrite: NEGATIVE
PH: 7 (ref 5.0–8.0)
Protein, ur: NEGATIVE mg/dL
SPECIFIC GRAVITY, URINE: 1.003 — AB (ref 1.005–1.030)

## 2017-06-12 LAB — COMPREHENSIVE METABOLIC PANEL
ALBUMIN: 2.6 g/dL — AB (ref 3.5–5.0)
ALT: 88 U/L — AB (ref 14–54)
AST: 179 U/L — AB (ref 15–41)
Alkaline Phosphatase: 251 U/L — ABNORMAL HIGH (ref 38–126)
Anion gap: 9 (ref 5–15)
CHLORIDE: 110 mmol/L (ref 101–111)
CO2: 17 mmol/L — AB (ref 22–32)
Calcium: 8.2 mg/dL — ABNORMAL LOW (ref 8.9–10.3)
Creatinine, Ser: 0.85 mg/dL (ref 0.44–1.00)
GFR calc Af Amer: >60 mL/min (ref >60)
GFR calc non Af Amer: >60 mL/min (ref >60)
GLUCOSE: 138 mg/dL — AB (ref 65–99)
POTASSIUM: 2.7 mmol/L — AB (ref 3.5–5.1)
Sodium: 136 mmol/L (ref 135–145)
Total Bilirubin: 10.3 mg/dL — ABNORMAL HIGH (ref 0.3–1.2)
Total Protein: 6.8 g/dL (ref 6.5–8.1)

## 2017-06-12 LAB — PROTIME-INR
INR: 1.33
PROTHROMBIN TIME: 16.4 s — AB (ref 11.4–15.2)

## 2017-06-12 LAB — CBC
HEMATOCRIT: 29.8 % — AB (ref 36.0–46.0)
Hemoglobin: 10.3 g/dL — ABNORMAL LOW (ref 12.0–15.0)
MCH: 31.7 pg (ref 26.0–34.0)
MCHC: 34.6 g/dL (ref 30.0–36.0)
MCV: 91.7 fL (ref 78.0–100.0)
Platelets: 121 10*3/uL — ABNORMAL LOW (ref 150–400)
RBC: 3.25 MIL/uL — ABNORMAL LOW (ref 3.87–5.11)
RDW: 21.3 % — AB (ref 11.5–15.5)
WBC: 7.4 10*3/uL (ref 4.0–10.5)

## 2017-06-12 LAB — ACETAMINOPHEN LEVEL: Acetaminophen (Tylenol), Serum: <10 ug/mL — ABNORMAL LOW (ref 10–30)

## 2017-06-12 LAB — LIPASE, BLOOD: LIPASE: 53 U/L — AB (ref 11–51)

## 2017-06-12 LAB — I-STAT TROPONIN, ED: Troponin i, poc: 0 ng/mL (ref 0.00–0.08)

## 2017-06-12 LAB — MAGNESIUM: MAGNESIUM: 1.7 mg/dL (ref 1.7–2.4)

## 2017-06-12 LAB — I-STAT BETA HCG BLOOD, ED (MC, WL, AP ONLY): I-stat hCG, quantitative: <5 m[IU]/mL (ref <5)

## 2017-06-12 LAB — ETHANOL: Alcohol, Ethyl (B): <10 mg/dL (ref <10)

## 2017-06-12 LAB — AMMONIA: AMMONIA: 30 umol/L (ref 9–35)

## 2017-06-12 MED ORDER — POTASSIUM CHLORIDE CRYS ER 20 MEQ PO TBCR
60.0000 meq | EXTENDED_RELEASE_TABLET | Freq: Once | ORAL | Status: AC
  Administered 2017-06-12: 60 meq via ORAL
  Filled 2017-06-12: qty 3

## 2017-06-12 MED ORDER — SODIUM CHLORIDE 0.9 % IV BOLUS
1000.0000 mL | Freq: Once | INTRAVENOUS | Status: AC
  Administered 2017-06-12: 1000 mL via INTRAVENOUS

## 2017-06-12 NOTE — ED Triage Notes (Addendum)
Interpreter used. Pt speaks [name]. Arrives today with case management who saw her 1 week ago and states she was at baseline. Saw her yesterday and noted her eyes were jaundiced and today she looks worse with jaundice and generalized facial edema. Pt was recently in hospital for malnutrition. PT denies chest pain, denies shortness of breath, denies nausea vomiting or diarrhea. Does endorse a cough and pain with coughing. And pain when eating hot food. Also states her urine is yellow. Pt now states she does have chest pain off and on via the interpreter machine. LMP was April 8th.

## 2017-06-12 NOTE — ED Notes (Signed)
Charge RN made aware of critical lab value.

## 2017-06-12 NOTE — Discharge Instructions (Addendum)
Stop drinking. Your liver disease is worsening. The only treatment is to stop drinking alcohol completely. Call [facility] for follow up appointments.

## 2017-06-12 NOTE — ED Provider Notes (Signed)
[facility] EMERGENCY DEPARTMENT Provider Note   CSN: [id number] Arrival date & time: 06/12/17  1046     History   Chief Complaint Chief Complaint  Patient presents with  . Jaundice  . Facial Swelling    HPI Susan Mcconnell is a 42 y.o. female.  Chief complaint is Corporate investment banker thought she looked worse today".  HPI 42 year old female.  She is from [place].  She lives here in [place] with 3 kids.  Admitted a few weeks ago.  Extensive evaluation for hepatitis.  Eventually/ultimately thought to be alcohol hepatitis.  Discharged.  There is a Technical brewer that was checking on her today.  She had not seen her since her discharge.  She thought she looked "swollen in her face, and her eyes looked yellow".  So she encouraged her to come in for evaluation and thus brought her here.  Patient has no complaints.  Ultimately, is able to use the polys interpreter to speak with her.  She has no complaints and feels well  Past Medical History:  Diagnosis Date  . Pneumonia   . TB (tuberculosis) ~ 2017   "took the RX and was healed" (05/04/2017)    Patient Active Problem List   Diagnosis Date Noted  . Hypertriglyceridemia 05/06/2017  . Compensated metabolic acidosis 05/06/2017  . Pancytopenia (HCC)   . Thrombocytopenia (HCC) 05/04/2017  . Hypokalemia 05/04/2017  . Abnormal LFTs 05/04/2017  . Chest pain 05/04/2017  . Protein-calorie malnutrition, moderate (HCC) 05/04/2017  . Elevated liver enzymes     Past Surgical History:  Procedure Laterality Date  . OTHER SURGICAL HISTORY  2010   "birth control surgery; don't know what they did"     OB History   None      Home Medications    Prior to Admission medications   Medication Sig Start Date End Date Taking? Authorizing Provider  OVER THE COUNTER MEDICATION Take 1 tablet by mouth as needed. Fever medication   Yes [provider]    Family History No family history on file.  Social  History Social History   Tobacco Use  . Smoking status: Never Smoker  . Smokeless tobacco: Current User    Types: Chew  Substance Use Topics  . Alcohol use: Yes    Alcohol/week: 12.6 oz    Types: 21 Cans of beer per week    Comment: 05/04/2017 "1 can, 3 times/day"  . Drug use: Never     Allergies   Patient has no known allergies.   Review of Systems Review of Systems  Constitutional: Negative for appetite change, chills, diaphoresis, fatigue and fever.  HENT: Negative for mouth sores, sore throat and trouble swallowing.   Eyes: Negative for visual disturbance.  Respiratory: Negative for cough, chest tightness, shortness of breath and wheezing.   Cardiovascular: Negative for chest pain.  Gastrointestinal: Negative for abdominal distention, abdominal pain, diarrhea, nausea and vomiting.       Light stools.  Endocrine: Negative for polydipsia, polyphagia and polyuria.  Genitourinary: Negative for dysuria, frequency and hematuria.       Dark urine.  Musculoskeletal: Negative for gait problem.  Skin: Negative for color change, pallor and rash.  Neurological: Negative for dizziness, syncope, light-headedness and headaches.  Hematological: Does not bruise/bleed easily.  Psychiatric/Behavioral: Negative for behavioral problems and confusion.     Physical Exam Updated Vital Signs BP (!) 100/59   Pulse (!) 101   Temp 98.4 F (36.9 C) (Oral)   Resp Marland Kitchen)  23   Ht  (1.6 m)   Wt 49.9 kg (110 lb)   SpO2 96%   BMI 19.49 kg/m   Physical Exam  Constitutional: She is oriented to person, place, and time. She appears well-developed and well-nourished. No distress.  HENT:  Head: Normocephalic.  Conjunctive are not pale.  Scleral icterus noted.  Eyes: Pupils are equal, round, and reactive to light. Conjunctivae are normal. No scleral icterus.  Neck: Normal range of motion. Neck supple. No thyromegaly present.  Cardiovascular: Normal rate and regular rhythm. Exam reveals no  gallop and no friction rub.  No murmur heard. Pulmonary/Chest: Effort normal and breath sounds normal. No respiratory distress. She has no wheezes. She has no rales.  Abdominal: Soft. Bowel sounds are normal. She exhibits no distension. There is no tenderness. There is no rebound.  Soft.  Nondistended.  No fluid wave.  Musculoskeletal: Normal range of motion.  Neurological: She is alert and oriented to person, place, and time.  Awake and alert.  Although difficult to determine through interpretation.  Interpreter states the patient is following conversation without difficulty.  No asterixis  Skin: Skin is warm and dry. No rash noted.  Psychiatric: She has a normal mood and affect. Her behavior is normal.     ED Treatments / Results  Labs (all labs ordered are listed, but only abnormal results are displayed) Labs Reviewed  LIPASE, BLOOD - Abnormal; Notable for the following components:      Result Value   Lipase 53 (*)    All other components within normal limits  COMPREHENSIVE METABOLIC PANEL - Abnormal; Notable for the following components:   Potassium 2.7 (*)    CO2 17 (*)    Glucose, Bld 138 (*)    BUN <5 (*)    Calcium 8.2 (*)    Albumin 2.6 (*)    AST 179 (*)    ALT 88 (*)    Alkaline Phosphatase 251 (*)    Total Bilirubin 10.3 (*)    All other components within normal limits  CBC - Abnormal; Notable for the following components:   RBC 3.25 (*)    Hemoglobin 10.3 (*)    HCT 29.8 (*)    RDW 21.3 (*)    Platelets 121 (*)    All other components within normal limits  URINALYSIS, ROUTINE W REFLEX MICROSCOPIC - Abnormal; Notable for the following components:   Color, Urine AMBER (*)    Specific Gravity, Urine 1.003 (*)    Leukocytes, UA MODERATE (*)    Bacteria, UA FEW (*)    All other components within normal limits  PROTIME-INR - Abnormal; Notable for the following components:   Prothrombin Time 16.4 (*)    All other components within normal limits  ACETAMINOPHEN  LEVEL - Abnormal; Notable for the following components:   Acetaminophen (Tylenol), Serum <10 (*)    All other components within normal limits  AMMONIA  ETHANOL  MAGNESIUM  I-STAT BETA HCG BLOOD, ED ([facility], [facility], [facility] ONLY)  I-STAT TROPONIN, ED    EKG None  Radiology Dg Chest 2 View  Result Date: 06/12/2017 CLINICAL DATA:  Jaundice and edema with cough, initial encounter EXAM: CHEST - 2 VIEW COMPARISON:  None. FINDINGS: The heart size and mediastinal contours are within normal limits. Both lungs are clear. The visualized skeletal structures are unremarkable. IMPRESSION: No active cardiopulmonary disease. Electronically Signed   By: Alcide Clever M.D.   On: 06/12/2017 11:48    Procedures Procedures (including critical  care time)  Medications Ordered in ED Medications  potassium chloride SA (K-DUR,KLOR-CON) CR tablet 60 mEq (60 mEq Oral Given 06/12/17 1441)  sodium chloride 0.9 % bolus 1,000 mL (1,000 mLs Intravenous New Bag/Given 06/12/17 1552)     Initial Impression / Assessment and Plan / ED Course  I have reviewed the triage vital signs and the nursing notes.  Pertinent labs & imaging results that were available during my care of the patient were reviewed by me and considered in my medical decision making (see chart for details).    Not coagulopathic.  Not confused.  One low blood pressure reading responded to fluids.  Is taking p.o. without difficulty.  No abdominal complaints.  With normal ammonia, no asterixis is not encephalopathic.  Has elevation of her enzymes comparable to when she was admitted.  Has had increase of her bilirubin.  Speaking with the community care manager she states that the patient drinks "every day all day long".  States she is "drunk most of the time".  I discussed this with patient.  The only treatment for acute alcohol hepatitis this time and abstinence.  She will be given GI for follow-up.  No indications for admission.  Final Clinical Impressions(s) / ED  Diagnoses   Final diagnoses:  Alcoholic hepatitis, unspecified whether ascites present    ED Discharge Orders    None       Rolland Porter, MD 06/12/17 1751

## 2017-06-12 NOTE — ED Notes (Signed)
Case worker left to check on pt's child getting home from school.

## 2017-06-12 NOTE — ED Notes (Signed)
Pt stable, ambulatory, states understanding of discharge instructions, pt to call case manager for transport home

## 2017-06-23 ENCOUNTER — Inpatient Hospital Stay (HOSPITAL_COMMUNITY)
Admission: EM | Admit: 2017-06-23 | Discharge: 2017-06-26 | DRG: 871 | Disposition: A | Discharge status: Home / Self Care | Payer: Medicaid Other | Attending: Family Medicine | Admitting: Family Medicine

## 2017-06-23 ENCOUNTER — Encounter (HOSPITAL_COMMUNITY): Payer: Self-pay | Admitting: Emergency Medicine

## 2017-06-23 DIAGNOSIS — A419 Sepsis, unspecified organism: Principal | ICD-10-CM | POA: Diagnosis present

## 2017-06-23 DIAGNOSIS — D61818 Other pancytopenia: Secondary | ICD-10-CM | POA: Diagnosis present

## 2017-06-23 DIAGNOSIS — G9341 Metabolic encephalopathy: Secondary | ICD-10-CM | POA: Diagnosis present

## 2017-06-23 DIAGNOSIS — E781 Pure hyperglyceridemia: Secondary | ICD-10-CM | POA: Diagnosis present

## 2017-06-23 DIAGNOSIS — E878 Other disorders of electrolyte and fluid balance, not elsewhere classified: Secondary | ICD-10-CM | POA: Diagnosis present

## 2017-06-23 DIAGNOSIS — K7010 Alcoholic hepatitis without ascites: Secondary | ICD-10-CM | POA: Diagnosis present

## 2017-06-23 DIAGNOSIS — E860 Dehydration: Secondary | ICD-10-CM | POA: Diagnosis present

## 2017-06-23 DIAGNOSIS — F10220 Alcohol dependence with intoxication, uncomplicated: Secondary | ICD-10-CM | POA: Diagnosis present

## 2017-06-23 DIAGNOSIS — I517 Cardiomegaly: Secondary | ICD-10-CM | POA: Diagnosis present

## 2017-06-23 DIAGNOSIS — E861 Hypovolemia: Secondary | ICD-10-CM | POA: Diagnosis present

## 2017-06-23 DIAGNOSIS — K7460 Unspecified cirrhosis of liver: Secondary | ICD-10-CM | POA: Diagnosis present

## 2017-06-23 DIAGNOSIS — J189 Pneumonia, unspecified organism: Secondary | ICD-10-CM | POA: Diagnosis present

## 2017-06-23 DIAGNOSIS — K759 Inflammatory liver disease, unspecified: Secondary | ICD-10-CM

## 2017-06-23 DIAGNOSIS — E872 Acidosis: Secondary | ICD-10-CM | POA: Diagnosis present

## 2017-06-23 DIAGNOSIS — Y908 Blood alcohol level of 240 mg/100 ml or more: Secondary | ICD-10-CM | POA: Diagnosis present

## 2017-06-23 DIAGNOSIS — E870 Hyperosmolality and hypernatremia: Secondary | ICD-10-CM | POA: Diagnosis present

## 2017-06-23 DIAGNOSIS — D649 Anemia, unspecified: Secondary | ICD-10-CM | POA: Diagnosis present

## 2017-06-23 DIAGNOSIS — F10920 Alcohol use, unspecified with intoxication, uncomplicated: Secondary | ICD-10-CM

## 2017-06-23 DIAGNOSIS — Z8611 Personal history of tuberculosis: Secondary | ICD-10-CM

## 2017-06-23 HISTORY — DX: Alcoholic hepatitis without ascites: K70.10

## 2017-06-23 LAB — COMPREHENSIVE METABOLIC PANEL
ALBUMIN: 2.3 g/dL — AB (ref 3.5–5.0)
ALK PHOS: 216 U/L — AB (ref 38–126)
ALT: 46 U/L (ref 14–54)
ANION GAP: 9 (ref 5–15)
AST: 190 U/L — ABNORMAL HIGH (ref 15–41)
BILIRUBIN TOTAL: 2.1 mg/dL — AB (ref 0.3–1.2)
BUN: 6 mg/dL (ref 6–20)
CHLORIDE: 114 mmol/L — AB (ref 101–111)
CO2: 17 mmol/L — ABNORMAL LOW (ref 22–32)
Calcium: 6.9 mg/dL — ABNORMAL LOW (ref 8.9–10.3)
Creatinine, Ser: 0.83 mg/dL (ref 0.44–1.00)
GFR calc Af Amer: >60 mL/min (ref >60)
GFR calc non Af Amer: >60 mL/min (ref >60)
GLUCOSE: 84 mg/dL (ref 65–99)
POTASSIUM: 3.8 mmol/L (ref 3.5–5.1)
SODIUM: 140 mmol/L (ref 135–145)
Total Protein: 5.7 g/dL — ABNORMAL LOW (ref 6.5–8.1)

## 2017-06-23 LAB — CBC WITH DIFFERENTIAL/PLATELET
Abs Immature Granulocytes: 0 10*3/uL (ref 0.0–0.1)
BASOS ABS: 0.1 10*3/uL (ref 0.0–0.1)
Basophils Relative: 1 %
EOS ABS: 0 10*3/uL (ref 0.0–0.7)
EOS PCT: 0 %
HCT: 27.7 % — ABNORMAL LOW (ref 36.0–46.0)
HEMOGLOBIN: 9.1 g/dL — AB (ref 12.0–15.0)
Immature Granulocytes: 0 %
Lymphocytes Relative: 54 %
Lymphs Abs: 3.6 10*3/uL (ref 0.7–4.0)
MCH: 31.7 pg (ref 26.0–34.0)
MCHC: 32.9 g/dL (ref 30.0–36.0)
MCV: 96.5 fL (ref 78.0–100.0)
Monocytes Absolute: 0.5 10*3/uL (ref 0.1–1.0)
Monocytes Relative: 7 %
Neutro Abs: 2.7 10*3/uL (ref 1.7–7.7)
Neutrophils Relative %: 38 %
Platelets: 323 10*3/uL (ref 150–400)
RBC: 2.87 MIL/uL — AB (ref 3.87–5.11)
RDW: 18.6 % — ABNORMAL HIGH (ref 11.5–15.5)
WBC: 6.9 10*3/uL (ref 4.0–10.5)

## 2017-06-23 LAB — I-STAT BETA HCG BLOOD, ED (MC, WL, AP ONLY): I-stat hCG, quantitative: <5 m[IU]/mL (ref <5)

## 2017-06-23 LAB — I-STAT VENOUS BLOOD GAS, ED
Acid-base deficit: 9 mmol/L — ABNORMAL HIGH (ref 0.0–2.0)
Bicarbonate: 15.5 mmol/L — ABNORMAL LOW (ref 20.0–28.0)
O2 Saturation: 89 %
PCO2 VEN: 28.8 mmHg — AB (ref 44.0–60.0)
PO2 VEN: 58 mmHg — AB (ref 32.0–45.0)
TCO2: 16 mmol/L — ABNORMAL LOW (ref 22–32)
pH, Ven: 7.338 (ref 7.250–7.430)

## 2017-06-23 LAB — I-STAT CHEM 8, ED
BUN: 5 mg/dL — ABNORMAL LOW (ref 6–20)
CHLORIDE: 115 mmol/L — AB (ref 101–111)
Calcium, Ion: 0.89 mmol/L — CL (ref 1.15–1.40)
Creatinine, Ser: 1.1 mg/dL — ABNORMAL HIGH (ref 0.44–1.00)
Glucose, Bld: 79 mg/dL (ref 65–99)
HEMATOCRIT: 28 % — AB (ref 36.0–46.0)
HEMOGLOBIN: 9.5 g/dL — AB (ref 12.0–15.0)
POTASSIUM: 4.1 mmol/L (ref 3.5–5.1)
SODIUM: 145 mmol/L (ref 135–145)
TCO2: 22 mmol/L (ref 22–32)

## 2017-06-23 LAB — CBG MONITORING, ED: GLUCOSE-CAPILLARY: 77 mg/dL (ref 65–99)

## 2017-06-23 LAB — I-STAT CG4 LACTIC ACID, ED: LACTIC ACID, VENOUS: 3.35 mmol/L — AB (ref 0.5–1.9)

## 2017-06-23 LAB — AMMONIA: AMMONIA: 50 umol/L — AB (ref 9–35)

## 2017-06-23 MED ORDER — SODIUM CHLORIDE 0.9 % IV BOLUS
1000.0000 mL | Freq: Once | INTRAVENOUS | Status: AC
  Administered 2017-06-23: 1000 mL via INTRAVENOUS

## 2017-06-23 MED ORDER — LACTATED RINGERS IV BOLUS
1000.0000 mL | Freq: Once | INTRAVENOUS | Status: AC
  Administered 2017-06-23: 1000 mL via INTRAVENOUS

## 2017-06-23 NOTE — ED Triage Notes (Signed)
Patient arrived with EMS from home , found unresponsive by family this evening / ETOH intoxication this evening , alert at arrival , CBG= 74 by EMS .

## 2017-06-23 NOTE — ED Provider Notes (Signed)
[facility] EMERGENCY DEPARTMENT Provider Note   CSN: [id number] Arrival date & time: 06/23/17  2234   History   Chief Complaint Chief Complaint  Patient presents with  . Unresponsive  . Alcohol Intoxication  Level 5 caveat due to altered mental status  HPI Susan Mcconnell is a 42 y.o. female.  The history is provided by a relative and the patient. The history is limited by the condition of the patient.  Alcohol Intoxication  This is a new problem. The problem occurs constantly. Nothing aggravates the symptoms. Nothing relieves the symptoms.  Patient presents from home for altered mental status and presumed alcohol intoxication  there are few details at this time.  Patient's English is limited and her words are slurred. No other details are known Past Medical History:  Diagnosis Date  . Alcoholic hepatitis   . Pneumonia   . TB (tuberculosis) ~ 2017   "took the RX and was healed" (05/04/2017)    Patient Active Problem List   Diagnosis Date Noted  . Hypertriglyceridemia 05/06/2017  . Compensated metabolic acidosis 05/06/2017  . Pancytopenia (HCC)   . Thrombocytopenia (HCC) 05/04/2017  . Hypokalemia 05/04/2017  . Abnormal LFTs 05/04/2017  . Chest pain 05/04/2017  . Protein-calorie malnutrition, moderate (HCC) 05/04/2017  . Elevated liver enzymes     Past Surgical History:  Procedure Laterality Date  . OTHER SURGICAL HISTORY  2010   "birth control surgery; don't know what they did"     OB History   None      Home Medications    Prior to Admission medications   Medication Sig Start Date End Date Taking? Authorizing Provider  OVER THE COUNTER MEDICATION Take 1 tablet by mouth as needed. Fever medication    [provider]    Family History No family history on file.  Social History Social History   Tobacco Use  . Smoking status: Never Smoker  . Smokeless tobacco: Current User    Types: Chew  Substance Use Topics  . Alcohol use:  Yes    Alcohol/week: 12.6 oz    Types: 21 Cans of beer per week    Comment: 05/04/2017 "1 can, 3 times/day"  . Drug use: Never     Allergies   Patient has no known allergies.   Review of Systems Review of Systems  Unable to perform ROS: Mental status change     Physical Exam Updated Vital Signs BP 99/74   Pulse (!) 107   Temp 97.9 F (36.6 C) (Rectal)   Resp 14   SpO2 96%   Physical Exam CONSTITUTIONAL: Disheveled, smells of alcohol HEAD: Normocephalic/atraumatic, no signs of trauma EYES: EOMI/PERRL ENMT: Mucous membranes dry NECK: supple no meningeal signs SPINE/BACK:entire spine nontender CV: S1/S2 noted, no murmurs/rubs/gallops noted LUNGS: Lungs are clear to auscultation bilaterally, no apparent distress ABDOMEN: soft, nontender, no rebound or guarding, bowel sounds noted throughout abdomen GU:no cva tenderness NEURO: Pt is awake and interactive, but slurring her speech.  Patient is very difficult to understand.  No facial droop.  No arm or leg drift. EXTREMITIES: pulses normal/equal, full ROM, minimal lower extremity edema.  No visible trauma SKIN: warm, color normal PSYCH: Unable to assess   ED Treatments / Results  Labs (all labs ordered are listed, but only abnormal results are displayed) Labs Reviewed  CBC WITH DIFFERENTIAL/PLATELET - Abnormal; Notable for the following components:      Result Value   RBC 2.87 (*)    Hemoglobin 9.1 (*)  HCT 27.7 (*)    RDW 18.6 (*)    All other components within normal limits  COMPREHENSIVE METABOLIC PANEL - Abnormal; Notable for the following components:   Chloride 114 (*)    CO2 17 (*)    Calcium 6.9 (*)    Total Protein 5.7 (*)    Albumin 2.3 (*)    AST 190 (*)    Alkaline Phosphatase 216 (*)    Total Bilirubin 2.1 (*)    All other components within normal limits  URINALYSIS, ROUTINE W REFLEX MICROSCOPIC - Abnormal; Notable for the following components:   Color, Urine STRAW (*)    Specific Gravity, Urine  1.004 (*)    All other components within normal limits  AMMONIA - Abnormal; Notable for the following components:   Ammonia 50 (*)    All other components within normal limits  ETHANOL - Abnormal; Notable for the following components:   Alcohol, Ethyl (B) 473 (*)    All other components within normal limits  BASIC METABOLIC PANEL - Abnormal; Notable for the following components:   Sodium 148 (*)    Chloride 122 (*)    CO2 16 (*)    BUN <5 (*)    Calcium 6.7 (*)    All other components within normal limits  I-STAT CHEM 8, ED - Abnormal; Notable for the following components:   Chloride 115 (*)    BUN 5 (*)    Creatinine, Ser 1.10 (*)    Calcium, Ion 0.89 (*)    Hemoglobin 9.5 (*)    HCT 28.0 (*)    All other components within normal limits  I-STAT CG4 LACTIC ACID, ED - Abnormal; Notable for the following components:   Lactic Acid, Venous 3.35 (*)    All other components within normal limits  I-STAT VENOUS BLOOD GAS, ED - Abnormal; Notable for the following components:   pCO2, Ven 28.8 (*)    pO2, Ven 58.0 (*)    Bicarbonate 15.5 (*)    TCO2 16 (*)    Acid-base deficit 9.0 (*)    All other components within normal limits  I-STAT CG4 LACTIC ACID, ED - Abnormal; Notable for the following components:   Lactic Acid, Venous 3.29 (*)    All other components within normal limits  CULTURE, BLOOD (ROUTINE X 2)  CULTURE, BLOOD (ROUTINE X 2)  RAPID URINE DRUG SCREEN, HOSP PERFORMED  CBC WITH DIFFERENTIAL/PLATELET  CBG MONITORING, ED  I-STAT BETA HCG BLOOD, ED ([facility], [facility], [facility] ONLY)    EKG EKG Interpretation  Date/Time:  Saturday Jun 23 2017 22:39:22 EDT Ventricular Rate:  113 PR Interval:    QRS Duration: 83 QT Interval:  341 QTC Calculation: 468 R Axis:   79 Text Interpretation:  Sinus tachycardia Borderline low voltage, extremity leads Nonspecific T abnormalities, lateral leads No STEMI.  Confirmed by Alona Bene [phone]) on 06/23/2017 10:44:36 PM   Radiology Dg Chest Port 1  View  Result Date: 06/24/2017 CLINICAL DATA:  Weakness, unresponsive EXAM: PORTABLE CHEST 1 VIEW COMPARISON:  06/12/2017, 05/03/2017 FINDINGS: Borderline cardiomegaly. Vascular congestion with diffuse interstitial opacities suspicious for pulmonary edema. Patchy airspace opacity at the right based and right apex. No pneumothorax. IMPRESSION: 1. Borderline cardiomegaly with vascular congestion and mild interstitial edema 2. Patchy airspace disease at the right apex and right base, possible superimposed pneumonia Electronically Signed   By: Jasmine Pang M.D.   On: 06/24/2017 02:24    Procedures Procedures  CRITICAL CARE Performed by: Joya Gaskins Total  critical care time: 33 minutes Critical care time was exclusive of separately billable procedures and treating other patients. Critical care was necessary to treat or prevent imminent or life-threatening deterioration. Critical care was time spent personally by me on the following activities: development of treatment plan with patient and/or surrogate as well as nursing, discussions with consultants, evaluation of patient's response to treatment, examination of patient, obtaining history from patient or surrogate, ordering and performing treatments and interventions, ordering and review of laboratory studies, ordering and review of radiographic studies, pulse oximetry and re-evaluation of patient's condition. Patient with pneumonia, hypotension, dehydration requiring 4 L of IV fluids as well as IV antibiotics and admission  Medications Ordered in ED Medications  ceFEPIme (MAXIPIME) 2 g in sodium chloride 0.9 % 100 mL IVPB (has no administration in time range)  vancomycin (VANCOCIN) IVPB 1000 mg/200 mL premix (has no administration in time range)  lactated ringers bolus 1,000 mL (0 mLs Intravenous Stopped 06/23/17 2342)  sodium chloride 0.9 % bolus 1,000 mL (0 mLs Intravenous Stopped 06/24/17 0041)  sodium chloride 0.9 % bolus 1,000 mL (0 mLs  Intravenous Stopped 06/24/17 0053)  lactated ringers bolus 1,000 mL (0 mLs Intravenous Stopped 06/24/17 0338)     Initial Impression / Assessment and Plan / ED Course  I have reviewed the triage vital signs and the nursing notes.  Pertinent labs & imaging results that were available during my care of the patient were reviewed by me and considered in my medical decision making (see chart for details).     11:19 PM Patient presents for altered mental status and presumed alcohol intoxication.  Labs are pending at this time.  IV fluids to be ordered 12:54 AM Patient found to have alcohol intoxication.  She is dehydrated. After 3 L of fluid, we will recheck lactate and electrolytes 2:22 AM Patient resting comfortably, easily arousable.  She has no new complaints. Lactate still elevated, but this may be due to dehydration and liver disease Urinalysis negative.  She is afebrile.  Chest x-ray pending. Suspicion for sepsis is low 2:45 AM Chest x-ray is abnormal. She appears to have new onset infiltrates.  Since patient has elevated lactate, evidence of pneumonia she will need to be admitted. Her lactate is less than 4, but is elevated. She has received up to 4 L of IV fluids.  We will add on IV antibiotics. 3:55 AM Discussed with Dr. Emmit Pomfret for admission  I spoke to family with [name] interpreter. They have been updated on plan.  Patient still intoxicated and cannot contribute much to the history  Plan to admit for monitoring, IV antibiotics for pneumonia.  This is HCAP as patient was admitted in the past month for 4 days Suspect AMS is due to alcohol intoxication.  Patient still arousable.  Blood pressures drifted down but are now trending in the right direction  Final Clinical Impressions(s) / ED Diagnoses   Final diagnoses:  HCAP (healthcare-associated pneumonia)  Dehydration  Alcoholic intoxication without complication [facility] [place])    ED Discharge Orders    None       Zadie Rhine, MD 06/24/17 [phone]

## 2017-06-24 ENCOUNTER — Inpatient Hospital Stay (HOSPITAL_COMMUNITY): Payer: Medicaid Other

## 2017-06-24 ENCOUNTER — Encounter (HOSPITAL_COMMUNITY): Payer: Self-pay

## 2017-06-24 ENCOUNTER — Emergency Department (HOSPITAL_COMMUNITY): Payer: Medicaid Other

## 2017-06-24 ENCOUNTER — Other Ambulatory Visit: Payer: Self-pay

## 2017-06-24 DIAGNOSIS — E861 Hypovolemia: Secondary | ICD-10-CM | POA: Diagnosis present

## 2017-06-24 DIAGNOSIS — J189 Pneumonia, unspecified organism: Secondary | ICD-10-CM | POA: Diagnosis present

## 2017-06-24 DIAGNOSIS — E878 Other disorders of electrolyte and fluid balance, not elsewhere classified: Secondary | ICD-10-CM | POA: Diagnosis present

## 2017-06-24 DIAGNOSIS — K7460 Unspecified cirrhosis of liver: Secondary | ICD-10-CM | POA: Diagnosis present

## 2017-06-24 DIAGNOSIS — G9341 Metabolic encephalopathy: Secondary | ICD-10-CM | POA: Diagnosis present

## 2017-06-24 DIAGNOSIS — A419 Sepsis, unspecified organism: Secondary | ICD-10-CM | POA: Diagnosis not present

## 2017-06-24 DIAGNOSIS — K759 Inflammatory liver disease, unspecified: Secondary | ICD-10-CM | POA: Insufficient documentation

## 2017-06-24 DIAGNOSIS — I517 Cardiomegaly: Secondary | ICD-10-CM | POA: Diagnosis present

## 2017-06-24 DIAGNOSIS — F10220 Alcohol dependence with intoxication, uncomplicated: Secondary | ICD-10-CM | POA: Diagnosis present

## 2017-06-24 DIAGNOSIS — D649 Anemia, unspecified: Secondary | ICD-10-CM | POA: Diagnosis present

## 2017-06-24 DIAGNOSIS — I9589 Other hypotension: Secondary | ICD-10-CM

## 2017-06-24 DIAGNOSIS — D61818 Other pancytopenia: Secondary | ICD-10-CM | POA: Diagnosis present

## 2017-06-24 DIAGNOSIS — Z8611 Personal history of tuberculosis: Secondary | ICD-10-CM | POA: Diagnosis not present

## 2017-06-24 DIAGNOSIS — Y908 Blood alcohol level of 240 mg/100 ml or more: Secondary | ICD-10-CM | POA: Diagnosis present

## 2017-06-24 DIAGNOSIS — E860 Dehydration: Secondary | ICD-10-CM | POA: Diagnosis present

## 2017-06-24 DIAGNOSIS — F10920 Alcohol use, unspecified with intoxication, uncomplicated: Secondary | ICD-10-CM | POA: Insufficient documentation

## 2017-06-24 DIAGNOSIS — E872 Acidosis: Secondary | ICD-10-CM | POA: Diagnosis present

## 2017-06-24 DIAGNOSIS — K7010 Alcoholic hepatitis without ascites: Secondary | ICD-10-CM | POA: Diagnosis present

## 2017-06-24 DIAGNOSIS — E870 Hyperosmolality and hypernatremia: Secondary | ICD-10-CM | POA: Diagnosis present

## 2017-06-24 DIAGNOSIS — E781 Pure hyperglyceridemia: Secondary | ICD-10-CM | POA: Diagnosis present

## 2017-06-24 LAB — IRON AND TIBC
Iron: 109 ug/dL (ref 28–170)
SATURATION RATIOS: 44 % — AB (ref 10.4–31.8)
TIBC: 249 ug/dL — AB (ref 250–450)
UIBC: 140 ug/dL

## 2017-06-24 LAB — BASIC METABOLIC PANEL
Anion gap: 10 (ref 5–15)
CALCIUM: 6.7 mg/dL — AB (ref 8.9–10.3)
CO2: 16 mmol/L — ABNORMAL LOW (ref 22–32)
CREATININE: 0.72 mg/dL (ref 0.44–1.00)
Chloride: 122 mmol/L — ABNORMAL HIGH (ref 101–111)
GFR calc Af Amer: >60 mL/min (ref >60)
GLUCOSE: 82 mg/dL (ref 65–99)
POTASSIUM: 3.9 mmol/L (ref 3.5–5.1)
SODIUM: 148 mmol/L — AB (ref 135–145)

## 2017-06-24 LAB — I-STAT CG4 LACTIC ACID, ED: LACTIC ACID, VENOUS: 3.29 mmol/L — AB (ref 0.5–1.9)

## 2017-06-24 LAB — URINALYSIS, ROUTINE W REFLEX MICROSCOPIC
BILIRUBIN URINE: NEGATIVE
Glucose, UA: NEGATIVE mg/dL
Hgb urine dipstick: NEGATIVE
Ketones, ur: NEGATIVE mg/dL
LEUKOCYTES UA: NEGATIVE
NITRITE: NEGATIVE
Protein, ur: NEGATIVE mg/dL
SPECIFIC GRAVITY, URINE: 1.004 — AB (ref 1.005–1.030)
pH: 6 (ref 5.0–8.0)

## 2017-06-24 LAB — HIV ANTIBODY (ROUTINE TESTING W REFLEX): HIV Screen 4th Generation wRfx: NONREACTIVE

## 2017-06-24 LAB — RAPID URINE DRUG SCREEN, HOSP PERFORMED
Amphetamines: NOT DETECTED
Barbiturates: NOT DETECTED
Benzodiazepines: NOT DETECTED
Cocaine: NOT DETECTED
OPIATES: NOT DETECTED
Tetrahydrocannabinol: NOT DETECTED

## 2017-06-24 LAB — VITAMIN B12: Vitamin B-12: 212 pg/mL (ref 180–914)

## 2017-06-24 LAB — TROPONIN I
Troponin I: <0.03 ng/mL (ref <0.03)
Troponin I: <0.03 ng/mL (ref <0.03)

## 2017-06-24 LAB — STREP PNEUMONIAE URINARY ANTIGEN: Strep Pneumo Urinary Antigen: NEGATIVE

## 2017-06-24 LAB — FERRITIN: Ferritin: 463 ng/mL — ABNORMAL HIGH (ref 11–307)

## 2017-06-24 LAB — CORTISOL: CORTISOL PLASMA: 8.2 ug/dL

## 2017-06-24 LAB — ETHANOL
ALCOHOL ETHYL (B): 285 mg/dL — AB (ref <10)
Alcohol, Ethyl (B): 473 mg/dL (ref <10)

## 2017-06-24 MED ORDER — LORAZEPAM 1 MG PO TABS
1.0000 mg | ORAL_TABLET | Freq: Four times a day (QID) | ORAL | Status: DC | PRN
  Administered 2017-06-24: 1 mg via ORAL
  Filled 2017-06-24: qty 1

## 2017-06-24 MED ORDER — VANCOMYCIN HCL IN DEXTROSE 1-5 GM/200ML-% IV SOLN
1000.0000 mg | Freq: Once | INTRAVENOUS | Status: AC
  Administered 2017-06-24: 1000 mg via INTRAVENOUS
  Filled 2017-06-24: qty 200

## 2017-06-24 MED ORDER — LACTATED RINGERS IV BOLUS
1000.0000 mL | Freq: Once | INTRAVENOUS | Status: AC
  Administered 2017-06-24: 1000 mL via INTRAVENOUS

## 2017-06-24 MED ORDER — THIAMINE HCL 100 MG/ML IJ SOLN
Freq: Once | INTRAVENOUS | Status: AC
  Administered 2017-06-24: 08:00:00 via INTRAVENOUS
  Filled 2017-06-24: qty 1000

## 2017-06-24 MED ORDER — SODIUM CHLORIDE 0.9 % IV SOLN
2.0000 g | Freq: Once | INTRAVENOUS | Status: AC
  Administered 2017-06-24: 2 g via INTRAVENOUS
  Filled 2017-06-24: qty 2

## 2017-06-24 MED ORDER — SODIUM CHLORIDE 0.9 % IV SOLN
1.0000 g | Freq: Three times a day (TID) | INTRAVENOUS | Status: DC
  Administered 2017-06-24 – 2017-06-25 (×3): 1 g via INTRAVENOUS
  Filled 2017-06-24 (×5): qty 1

## 2017-06-24 MED ORDER — SODIUM CHLORIDE 0.9 % IV BOLUS
1000.0000 mL | Freq: Once | INTRAVENOUS | Status: AC
  Administered 2017-06-24: 1000 mL via INTRAVENOUS

## 2017-06-24 MED ORDER — VANCOMYCIN HCL IN DEXTROSE 750-5 MG/150ML-% IV SOLN
750.0000 mg | Freq: Two times a day (BID) | INTRAVENOUS | Status: DC
  Administered 2017-06-24 – 2017-06-25 (×2): 750 mg via INTRAVENOUS
  Filled 2017-06-24 (×2): qty 150

## 2017-06-24 MED ORDER — SODIUM CHLORIDE 0.9 % IV SOLN
INTRAVENOUS | Status: DC
  Administered 2017-06-24 (×2): via INTRAVENOUS

## 2017-06-24 MED ORDER — LORAZEPAM 2 MG/ML IJ SOLN: 1.0000 mg | Freq: Four times a day (QID) | INTRAMUSCULAR | Status: DC | PRN

## 2017-06-24 MED ORDER — SODIUM CHLORIDE 0.9 % IV BOLUS
500.0000 mL | Freq: Once | INTRAVENOUS | Status: AC
  Administered 2017-06-24: 500 mL via INTRAVENOUS

## 2017-06-24 NOTE — ED Notes (Signed)
Pt BP 73

## 2017-06-24 NOTE — ED Notes (Signed)
PT ambulates to restroom with assistance.

## 2017-06-24 NOTE — Progress Notes (Addendum)
Patient ID: Susan Mcconnell, female   DOB: 25-Mar-1975, 42 y.o.   MRN: [id number]                                                                PROGRESS NOTE                                                                                                                                                                                                             Patient Demographics:    Susan Mcconnell, is a 42 y.o. female, DOB - 12/11/1975, WUJ:[id number]  Admit date - 06/23/2017   Admitting Physician Tonye Royalty, DO  Outpatient Primary MD for the patient is Patient, No Pcp Per  LOS - 0  Outpatient Specialists:     Chief Complaint  Patient presents with  . Unresponsive  . Alcohol Intoxication       Brief Narrative 42 y.o. female with a known history of alcoholic hepatitis, pneumonia, tuberculosis presents to the emergency department for evaluation of altered mental status secondary to alcohol intoxication.  Patient was brought in by family. Found to meet sepsis criteria likely secondary to pneumonia   EMS/ED Course: Patient received Axid p.m., vancomycin, normal saline and lactated Ringer's. Medical admission has been requested for further management of sepsis secondary to pneumonia.     Subjective:    Susan Mcconnell today is a very poor historian.  Her friend says that she was more somnolent at home.  She was brought in by her family for evaluation.  CXR showed ? Pneumonia.  bp soft.     No headache, No chest pain, No abdominal pain - No Nausea, No new weakness tingling or numbness, No Cough - SOB.    Assessment  & Plan :    Active Problems:   Sepsis due to pneumonia (HCC)   Hypotension ? Secondary to sepsis Hydrate with ns iv Trop I q6h x3 Cortisol Check cardiac echo PCCM consult  Sepsis secondary to pneumonia Blood culture x2 pending Sputum culture pending Cont IV antibiotics Check cbc in am  Anemia, normocytic -check iron, ferritin, B12, folate, FOBT 3  Elevated  LFTs and bilirubin, h/o EtOH RUQ US=> negative for acute cholecystitis Hepatitis panel pending  EtOH intoxication Banana bag CIWA   Admission status: Inpatient IV Fluids: NS Diet/Nutrition: NPO Consults  called: None  DVT Px: SCDs and early ambulation. Code Status: Full Code  Disposition Plan:  after improved    Lab Results  Component Value Date   PLT 323 06/23/2017    Antibiotics  :  Vanco/ cefepime 5/26=>  Anti-infectives (From admission, onward)   Start     Dose/Rate Route Frequency Ordered Stop   06/24/17 1400  ceFEPIme (MAXIPIME) 1 g in sodium chloride 0.9 % 100 mL IVPB     1 g 200 mL/hr over 30 Minutes Intravenous Every 8 hours 06/24/17 0531 07/02/17 1359   06/24/17 0245  ceFEPIme (MAXIPIME) 2 g in sodium chloride 0.9 % 100 mL IVPB     2 g 200 mL/hr over 30 Minutes Intravenous  Once 06/24/17 0244 06/24/17 0512   06/24/17 0245  vancomycin (VANCOCIN) IVPB 1000 mg/200 mL premix     1,000 mg 200 mL/hr over 60 Minutes Intravenous  Once 06/24/17 0244 06/24/17 0512        Objective:   Vitals:   06/24/17 0400 06/24/17 0500 06/24/17 0515 06/24/17 0601  BP: 94/62  Pulse: (!) 102 100 100 100  Resp: (!) 24 (!) 21 18   Temp:      TempSrc:      SpO2: 95% 96% 97%     Wt Readings from Last 3 Encounters:  06/12/17 49.9 kg (110 lb)  05/03/17 49.9 kg (110 lb)     Intake/Output Summary (Last 24 hours) at 06/24/2017 1610 Last data filed at 06/24/2017 9604 Gross per 24 hour  Intake 6000 ml  Output -  Net 6000 ml     Physical Exam  Awake Alert, Oriented X 3, No new F.N deficits, Normal affect [place].AT,PERRAL Supple Neck,No JVD, No cervical lymphadenopathy appriciated.  Symmetrical Chest wall movement, Good air movement bilaterally, + bibasilar crackles. No wheezing RRR,No Gallops,Rubs or new Murmurs, No Parasternal Heave +ve B.Sounds, Abd Soft, No tenderness, No organomegaly appriciated, No rebound - guarding or rigidity. No Cyanosis, Clubbing  or edema, No new Rash or bruise      Data Review:    CBC Recent Labs  Lab 06/23/17 2240 06/23/17 2256  WBC 6.9  --   HGB 9.1* 9.5*  HCT 27.7* 28.0*  PLT 323  --   MCV 96.5  --   MCH 31.7  --   MCHC 32.9  --   RDW 18.6*  --   LYMPHSABS 3.6  --   MONOABS 0.5  --   EOSABS 0.0  --   BASOSABS 0.1  --     Chemistries  Recent Labs  Lab 06/23/17 2240 06/23/17 2256 06/24/17 0205  NA 140 145 148*  K 3.8 4.1 3.9  CL 114* 115* 122*  CO2 17*  --  16*  GLUCOSE 84 79 82  BUN 6 5* <5*  CREATININE 0.83 1.10* 0.72  CALCIUM 6.9*  --  6.7*  AST 190*  --   --   ALT 46  --   --   ALKPHOS 216*  --   --   BILITOT 2.1*  --   --    ------------------------------------------------------------------------------------------------------------------ No results for input(s): CHOL, HDL, LDLCALC, TRIG, CHOLHDL, LDLDIRECT in the last 72 hours.  Lab Results  Component Value Date   HGBA1C 5.7 (H) 05/04/2017   ------------------------------------------------------------------------------------------------------------------ No results for input(s): TSH, T4TOTAL, T3FREE, THYROIDAB in the last 72 hours.  Invalid input(s): FREET3 ------------------------------------------------------------------------------------------------------------------ No results for input(s): VITAMINB12, FOLATE, FERRITIN, TIBC, IRON, RETICCTPCT in the last 72 hours.  Coagulation  profile No results for input(s): INR, PROTIME in the last 168 hours.  No results for input(s): DDIMER in the last 72 hours.  Cardiac Enzymes No results for input(s): CKMB, TROPONINI, MYOGLOBIN in the last 168 hours.  Invalid input(s): CK ------------------------------------------------------------------------------------------------------------------ No results found for: BNP  Inpatient Medications  Scheduled Meds: Continuous Infusions: . sodium chloride 100 mL/hr at 06/24/17 0615  . ceFEPime (MAXIPIME) IV    . banana bag IV 1000 mL  Stopped (06/24/17 0630)   PRN Meds:.LORazepam **OR** LORazepam  Micro Results No results found for this or any previous visit (from the past 240 hour(s)).  Radiology Reports Dg Chest 2 View  Result Date: 06/12/2017 CLINICAL DATA:  Jaundice and edema with cough, initial encounter EXAM: CHEST - 2 VIEW COMPARISON:  None. FINDINGS: The heart size and mediastinal contours are within normal limits. Both lungs are clear. The visualized skeletal structures are unremarkable. IMPRESSION: No active cardiopulmonary disease. Electronically Signed   By: Alcide Clever M.D.   On: 06/12/2017 11:48   Dg Chest Port 1 View  Result Date: 06/24/2017 CLINICAL DATA:  Weakness, unresponsive EXAM: PORTABLE CHEST 1 VIEW COMPARISON:  06/12/2017, 05/03/2017 FINDINGS: Borderline cardiomegaly. Vascular congestion with diffuse interstitial opacities suspicious for pulmonary edema. Patchy airspace opacity at the right based and right apex. No pneumothorax. IMPRESSION: 1. Borderline cardiomegaly with vascular congestion and mild interstitial edema 2. Patchy airspace disease at the right apex and right base, possible superimposed pneumonia Electronically Signed   By: Jasmine Pang M.D.   On: 06/24/2017 02:24   US Abdomen Limited Ruq  Result Date: 06/24/2017 CLINICAL DATA:  Increasing liver function studies and bilirubin. Alcoholic hepatitis. EXAM: ULTRASOUND ABDOMEN LIMITED RIGHT UPPER QUADRANT COMPARISON:  CT abdomen and pelvis 05/04/2017 FINDINGS: Gallbladder: Gallbladder is distended with a small amount of sludge. Minimal pericholecystic edema with borderline wall thickness. No stones. Murphy's sign was negative. Common bile duct: Diameter: 4.3 mm, normal Liver: Diffusely increased hepatic parenchymal echotexture consistent with fatty infiltration. Portal vein is patent on color Doppler imaging with normal direction of blood flow towards the liver. IMPRESSION: Gallbladder distention and mild sludge. Mild pericholecystic edema. No  stones. Changes are nonspecific but more likely represent chronic stasis rather than acute cholecystitis. Diffuse fatty infiltration of the liver. Electronically Signed   By: Burman Nieves M.D.   On: 06/24/2017 06:40    Time Spent in minutes  30 critical care for hypotension   Pearson Grippe M.D on 06/24/2017 at 7:02 AM  Between 7am to 7pm - Pager - [phone]   After 7pm go to [email or web address] - password [facility]  [facility] -  Office  [phone]

## 2017-06-24 NOTE — Progress Notes (Signed)
Pharmacy Antibiotic Note  Susan Mcconnell is a 42 y.o. female admitted on 06/23/2017 with sepsis/PNA.  Pharmacy has been consulted for Vancomycin  Dosing.  Vancomycin 1 g IV given in ED at  0400  Plan: Vancomycin 750 mg IV q12h     Temp (24hrs), Avg:97.9 F (36.6 C), Min:97.9 F (36.6 C), Max:97.9 F (36.6 C)  Recent Labs  Lab 06/23/17 2240 06/23/17 2256 06/24/17 0112 06/24/17 0205  WBC 6.9  --   --   --   CREATININE 0.83 1.10*  --  0.72  LATICACIDVEN  --  3.35* 3.29*  --     Estimated Creatinine Clearance: 73.6 mL/min (by C-G formula based on SCr of 0.72 mg/dL).    No Known Allergies   Eddie Candle 06/24/2017 5:43 AM

## 2017-06-24 NOTE — H&P (Addendum)
History and Physical   [facility] - [place] @ [place] Admission History and Physical [employer], D.O.    Patient Name: Susan Mcconnell MR#: [id number] Date of Birth: Jan 22, 1976 Date of Admission: 06/23/2017  Referring MD/NP/PA: Dr. Bebe Shaggy Primary Care Physician: Patient, No Pcp Per  Chief Complaint:  Chief Complaint  Patient presents with  . Unresponsive  . Alcohol Intoxication  Please note the entire history is obtained from the patient's emergency department chart, emergency department providerPatient's personal history is limited by altered mental status, EtOH intoxication   HPI: Susan Mcconnell is a 42 y.o. female with a known history of alcoholic hepatitis, pneumonia, tuberculosis presents to the emergency department for evaluation of altered mental status secondary to alcohol intoxication.  Patient was brought in by family. Found to meet sepsis criteria likely secondary to pneumonia   EMS/ED Course: Patient received Axid p.m., vancomycin, normal saline and lactated Ringer's. Medical admission has been requested for further management of sepsis secondary to pneumonia.  Review of Systems:  Unable to obtain secondary to somnolence, alcohol intoxication  Past Medical History:  Diagnosis Date  . Alcoholic hepatitis   . Pneumonia   . TB (tuberculosis) ~ 2017   "took the RX and was healed" (05/04/2017)    Past Surgical History:  Procedure Laterality Date  . OTHER SURGICAL HISTORY  2010   "birth control surgery; don't know what they did"     reports that she has never smoked. Her smokeless tobacco use includes chew. She reports that she drinks about 12.6 oz of alcohol per week. She reports that she does not use drugs.  No Known Allergies  No family history on file.  Prior to Admission medications   Medication Sig Start Date End Date Taking? Authorizing Provider  OVER THE COUNTER MEDICATION Take 1 tablet by mouth as needed. Fever medication    [provider]    Physical Exam: Vitals:   06/24/17 0300 06/24/17 0315 06/24/17 0330 06/24/17 0400  BP: (!) 86/55 (!)  Pulse: 92 93 98 (!) 102  Resp: 17 (!) 21 (!) 22 (!) 24  Temp:      TempSrc:      SpO2: 93% 97% 95% 95%    GENERAL: 42 y.o.-year-old female patient, lying in the bed in no acute distress.  Arouses to painful stimuli, mostly sleeping HEENT: Head atraumatic, normocephalic. Pupils equal. Mucus membranes moist. NECK: Supple. No JVD. CHEST: Normal breath sounds bilaterally. No wheezing, rales, rhonchi or crackles. No use of accessory muscles of respiration.  No reproducible chest wall tenderness.  CARDIOVASCULAR: S1, S2 normal. No murmurs, rubs, or gallops. Cap refill <2 seconds. Pulses intact distally.  ABDOMEN: Soft, nondistended, nontender. No rebound, guarding, rigidity. Normoactive bowel sounds present in all four quadrants.  EXTREMITIES: No pedal edema, cyanosis, or clubbing. No calf tenderness or Homan's sign.  NEUROLOGIC: The patient is  Somnolent but arousable SKIN: Warm, dry, and intact without obvious rash, lesion, or ulcer.    Labs on Admission:  CBC: Recent Labs  Lab 06/23/17 2240 06/23/17 2256  WBC 6.9  --   NEUTROABS 2.7  --   HGB 9.1* 9.5*  HCT 27.7* 28.0*  MCV 96.5  --   PLT 323  --    Basic Metabolic Panel: Recent Labs  Lab 06/23/17 2240 06/23/17 2256 06/24/17 0205  NA 140 145 148*  K 3.8 4.1 3.9  CL 114* 115* 122*  CO2 17*  --  16*  GLUCOSE 84 79 82  BUN 6 5* <5*  CREATININE 0.83 1.10* 0.72  CALCIUM 6.9*  --  6.7*   GFR: Estimated Creatinine Clearance: 73.6 mL/min (by C-G formula based on SCr of 0.72 mg/dL). Liver Function Tests: Recent Labs  Lab 06/23/17 2240  AST 190*  ALT 46  ALKPHOS 216*  BILITOT 2.1*  PROT 5.7*  ALBUMIN 2.3*   No results for input(s): LIPASE, AMYLASE in the last 168 hours. Recent Labs  Lab 06/23/17 2241  AMMONIA 50*   Coagulation Profile: No results for input(s): INR,  PROTIME in the last 168 hours. Cardiac Enzymes: No results for input(s): CKTOTAL, CKMB, CKMBINDEX, TROPONINI in the last 168 hours. BNP (last 3 results) No results for input(s): PROBNP in the last 8760 hours. HbA1C: No results for input(s): HGBA1C in the last 72 hours. CBG: Recent Labs  Lab 06/23/17 2235  GLUCAP 77   Lipid Profile: No results for input(s): CHOL, HDL, LDLCALC, TRIG, CHOLHDL, LDLDIRECT in the last 72 hours. Thyroid Function Tests: No results for input(s): TSH, T4TOTAL, FREET4, T3FREE, THYROIDAB in the last 72 hours. Anemia Panel: No results for input(s): VITAMINB12, FOLATE, FERRITIN, TIBC, IRON, RETICCTPCT in the last 72 hours. Urine analysis:    Component Value Date/Time   COLORURINE STRAW (A) 06/24/2017 0010   APPEARANCEUR CLEAR 06/24/2017 0010   LABSPEC 1.004 (L) 06/24/2017 0010   PHURINE 6.0 06/24/2017 0010   GLUCOSEU NEGATIVE 06/24/2017 0010   HGBUR NEGATIVE 06/24/2017 0010   BILIRUBINUR NEGATIVE 06/24/2017 0010   KETONESUR NEGATIVE 06/24/2017 0010   PROTEINUR NEGATIVE 06/24/2017 0010   NITRITE NEGATIVE 06/24/2017 0010   LEUKOCYTESUR NEGATIVE 06/24/2017 0010   Sepsis Labs: (procalcitonin:4,lacticidven:4) )No results found for this or any previous visit (from the past 240 hour(s)).   Radiological Exams on Admission: Dg Chest Port 1 View  Result Date: 06/24/2017 CLINICAL DATA:  Weakness, unresponsive EXAM: PORTABLE CHEST 1 VIEW COMPARISON:  06/12/2017, 05/03/2017 FINDINGS: Borderline cardiomegaly. Vascular congestion with diffuse interstitial opacities suspicious for pulmonary edema. Patchy airspace opacity at the right based and right apex. No pneumothorax. IMPRESSION: 1. Borderline cardiomegaly with vascular congestion and mild interstitial edema 2. Patchy airspace disease at the right apex and right base, possible superimposed pneumonia Electronically Signed   By: Jasmine Pang M.D.   On: 06/24/2017 02:24    EKG: Sinus tachycardia at 113 bpm  with normal axis and nonspecific ST-T wave changes.   Assessment/Plan  This is a 42 y.o. female with a history of alcoholic hepatitis, pneumonia, tuberculosis now being admitted with:  #. Sepsis secondary to pneumonia - Admit to inpatient with telemetry monitoring - IV antibiotics: Maxipime, Vanco - IV fluid hydration - Follow up blood sputum cultures - Repeat CBC in am.   #. Anemia, normocytic -check iron, ferritin, B12, folate, FOBT 3  #. Elevated LFTs and bilirubin, h/o EtOH - Check RUQ [place] - Check hepatitis panel  #. EtOH intoxication - Banana bag - CIWA - Check EtOH level  Admission status: Inpatient IV Fluids: NS Diet/Nutrition: NPO Consults called: None  DVT Px: SCDs and early ambulation. Code Status: Full Code  Disposition Plan: To home in 2-3 days  All the records are reviewed and case discussed with ED provider. Management plans discussed with the patient and/or family who express understanding and agree with plan of care.  Eowyn Tabone D.O. on 06/24/2017 at 4:51 AM CC: Primary care physician; Patient, No Pcp Per   06/24/2017, 4:51 AM

## 2017-06-24 NOTE — ED Notes (Signed)
Pt BP 73/50, Pt repositioned and cuff adjusted. BP back up to 86/61.

## 2017-06-24 NOTE — ED Notes (Signed)
Care handoff to Emmy RN 

## 2017-06-24 NOTE — ED Notes (Signed)
Patient transported to Ultrasound 

## 2017-06-24 NOTE — Consult Note (Signed)
PULMONARY / CRITICAL CARE MEDICINE   Name: Susan Mcconnell MRN: [id number] DOB: 04/18/1975    ADMISSION DATE:  06/23/2017 CONSULTATION DATE: 06/24/2017  REFERRING MD:  Dr Selena Batten  CHIEF COMPLAINT:  Hypotension   HISTORY OF PRESENT ILLNESS:   Susan Mcconnell is a 42 yr old lady with alcohol abuse, Hx of TB coming in with altered mental status and hypotension. She did receive 2.5 litres of fluid with no improvement. Patient is sleepy denies any fever, chills, rigors, cough or shortness of breath. She is being admitted by the hospitalist service but due to hypotension we were consulted. Patient was started empirically on Abx for suspected pneumonia. Sister at bedside and helping with answering questions.   Patient did have a similar admission last month and was also hypotensive not responsive to fluids and was thought that her baseline SBP 75-95.   PAST MEDICAL HISTORY :  She  has a past medical history of Alcoholic hepatitis, Pneumonia, and TB (tuberculosis) (~ 2017).  PAST SURGICAL HISTORY: She  has a past surgical history that includes OTHER SURGICAL HISTORY (2010).  No Known Allergies  No current facility-administered medications on file prior to encounter.    No current outpatient medications on file prior to encounter.    FAMILY HISTORY:  Her has no family status information on file.    SOCIAL HISTORY: She  reports that she has never smoked. Her smokeless tobacco use includes chew. She reports that she drinks about 12.6 oz of alcohol per week. She reports that she does not use drugs.  REVIEW OF SYSTEMS:   All 11 point system review were unremarkable other than what is mentioned in HPI     VITAL SIGNS: BP (!) 86/61   Pulse 94   Temp 97.9 F (36.6 C) (Rectal)   Resp 19   SpO2 95%   HEMODYNAMICS:    VENTILATOR SETTINGS:    INTAKE / OUTPUT: I/O last 3 completed shifts: In: 6000 [I.V.:3000; IV Piggyback:3000] Out: -   PHYSICAL EXAMINATION: General:  Sleepy not in distress   Neuro: sleepy moving all extremities non focal CN intact  HEENT:  Dry  Cardiovascular:  Normal heart sounds no murmrurs  Lungs:  Clear equal air sounds bilaterally  Abdomen: soft no tenderness  Musculoskeletal:  No edema  Skin: no rash   LABS:  BMET Recent Labs  Lab 06/23/17 2240 06/23/17 2256 06/24/17 0205  NA 140 145 148*  K 3.8 4.1 3.9  CL 114* 115* 122*  CO2 17*  --  16*  BUN 6 5* <5*  CREATININE 0.83 1.10* 0.72  GLUCOSE 84 79 82    Electrolytes Recent Labs  Lab 06/23/17 2240 06/24/17 0205  CALCIUM 6.9* 6.7*    CBC Recent Labs  Lab 06/23/17 2240 06/23/17 2256  WBC 6.9  --   HGB 9.1* 9.5*  HCT 27.7* 28.0*  PLT 323  --     Coag's No results for input(s): APTT, INR in the last 168 hours.  Sepsis Markers Recent Labs  Lab 06/23/17 2256 06/24/17 0112  LATICACIDVEN 3.35* 3.29*    ABG No results for input(s): PHART, PCO2ART, PO2ART in the last 168 hours.  Liver Enzymes Recent Labs  Lab 06/23/17 2240  AST 190*  ALT 46  ALKPHOS 216*  BILITOT 2.1*  ALBUMIN 2.3*    Cardiac Enzymes No results for input(s): TROPONINI, PROBNP in the last 168 hours.  Glucose Recent Labs  Lab 06/23/17 2235  GLUCAP 77    Imaging Dg Chest Port 1  View  Result Date: 06/24/2017 CLINICAL DATA:  Weakness, unresponsive EXAM: PORTABLE CHEST 1 VIEW COMPARISON:  06/12/2017, 05/03/2017 FINDINGS: Borderline cardiomegaly. Vascular congestion with diffuse interstitial opacities suspicious for pulmonary edema. Patchy airspace opacity at the right based and right apex. No pneumothorax. IMPRESSION: 1. Borderline cardiomegaly with vascular congestion and mild interstitial edema 2. Patchy airspace disease at the right apex and right base, possible superimposed pneumonia Electronically Signed   By: Jasmine Pang M.D.   On: 06/24/2017 02:24   US Abdomen Limited Ruq  Result Date: 06/24/2017 CLINICAL DATA:  Increasing liver function studies and bilirubin. Alcoholic hepatitis. EXAM:  ULTRASOUND ABDOMEN LIMITED RIGHT UPPER QUADRANT COMPARISON:  CT abdomen and pelvis 05/04/2017 FINDINGS: Gallbladder: Gallbladder is distended with a small amount of sludge. Minimal pericholecystic edema with borderline wall thickness. No stones. Murphy's sign was negative. Common bile duct: Diameter: 4.3 mm, normal Liver: Diffusely increased hepatic parenchymal echotexture consistent with fatty infiltration. Portal vein is patent on color Doppler imaging with normal direction of blood flow towards the liver. IMPRESSION: Gallbladder distention and mild sludge. Mild pericholecystic edema. No stones. Changes are nonspecific but more likely represent chronic stasis rather than acute cholecystitis. Diffuse fatty infiltration of the liver. Electronically Signed   By: Burman Nieves M.D.   On: 06/24/2017 06:40      ASSESSMENT / PLAN:  Alcohol intoxication Hypotension secondary to Hypovolemia and baseline low BP  Acute alcoholic hepatitis  Non anion gap metabolic acidosis secondary to hyperchloremia and saline resuscitation Acute metabolic encephalopathy secondary to intoxication  Suspected pneumonia  Plan: - I would resuscitate with LR rather than saline since her hyperchloremia - Patient did have a similar admission last month and was also hypotensive not responsive to fluids and was thought that her baseline SBP 75-95. Patient has normal renal function so I would argue that she might be perfusing he organs well at this BP. - she does have cardiomegaly and questionable pulm congestion might benefit from ECHO - GI input would be beneficial also. - patient has no fever, WBC count so pneumonia might be less likely but I agree with ABx and following cultures. - I think patient can be managed by the hospitalist service knowing her recent history and that her BP was very similar despite aggressive hydration last admission. Please reconsult [place] if needed.   Pulmonary and Critical Care Medicine [facility] Pager: [phone]  06/24/2017, 9:01 AM

## 2017-06-24 NOTE — ED Notes (Signed)
Shanda Bumps, RN accepts report. PT may go to 5C07

## 2017-06-25 ENCOUNTER — Inpatient Hospital Stay (HOSPITAL_COMMUNITY): Payer: Medicaid Other

## 2017-06-25 DIAGNOSIS — I361 Nonrheumatic tricuspid (valve) insufficiency: Secondary | ICD-10-CM

## 2017-06-25 DIAGNOSIS — K7030 Alcoholic cirrhosis of liver without ascites: Secondary | ICD-10-CM

## 2017-06-25 DIAGNOSIS — D649 Anemia, unspecified: Secondary | ICD-10-CM

## 2017-06-25 DIAGNOSIS — E870 Hyperosmolality and hypernatremia: Secondary | ICD-10-CM

## 2017-06-25 LAB — CBC
HEMATOCRIT: 27.6 % — AB (ref 36.0–46.0)
Hemoglobin: 8.9 g/dL — ABNORMAL LOW (ref 12.0–15.0)
MCH: 32.1 pg (ref 26.0–34.0)
MCHC: 32.2 g/dL (ref 30.0–36.0)
MCV: 99.6 fL (ref 78.0–100.0)
PLATELETS: 274 10*3/uL (ref 150–400)
RBC: 2.77 MIL/uL — AB (ref 3.87–5.11)
RDW: 17.7 % — AB (ref 11.5–15.5)
WBC: 6.7 10*3/uL (ref 4.0–10.5)

## 2017-06-25 LAB — COMPREHENSIVE METABOLIC PANEL
ALBUMIN: 2.1 g/dL — AB (ref 3.5–5.0)
ALT: 43 U/L (ref 14–54)
AST: 149 U/L — ABNORMAL HIGH (ref 15–41)
Alkaline Phosphatase: 202 U/L — ABNORMAL HIGH (ref 38–126)
Anion gap: 5 (ref 5–15)
BUN: <5 mg/dL — ABNORMAL LOW (ref 6–20)
CHLORIDE: 117 mmol/L — AB (ref 101–111)
CO2: 19 mmol/L — ABNORMAL LOW (ref 22–32)
CREATININE: 0.79 mg/dL (ref 0.44–1.00)
Calcium: 7.6 mg/dL — ABNORMAL LOW (ref 8.9–10.3)
GFR calc Af Amer: >60 mL/min (ref >60)
GFR calc non Af Amer: >60 mL/min (ref >60)
GLUCOSE: 59 mg/dL — AB (ref 65–99)
POTASSIUM: 3.9 mmol/L (ref 3.5–5.1)
Sodium: 141 mmol/L (ref 135–145)
Total Bilirubin: 2.3 mg/dL — ABNORMAL HIGH (ref 0.3–1.2)
Total Protein: 5.3 g/dL — ABNORMAL LOW (ref 6.5–8.1)

## 2017-06-25 LAB — HEPATITIS PANEL, ACUTE
HCV Ab: <0.1 s/co ratio (ref 0.0–0.9)
HEP B C IGM: NEGATIVE
HEP B S AG: NEGATIVE
Hep A IgM: NEGATIVE

## 2017-06-25 LAB — ECHOCARDIOGRAM COMPLETE
Height: 63 in
WEIGHTICAEL: 1703.7149 [oz_av]

## 2017-06-25 LAB — LEGIONELLA PNEUMOPHILA SEROGP 1 UR AG: L. pneumophila Serogp 1 Ur Ag: NEGATIVE

## 2017-06-25 LAB — PROCALCITONIN: Procalcitonin: 0.1 ng/mL

## 2017-06-25 LAB — MRSA PCR SCREENING: MRSA BY PCR: NEGATIVE

## 2017-06-25 LAB — GLUCOSE, CAPILLARY: GLUCOSE-CAPILLARY: 83 mg/dL (ref 65–99)

## 2017-06-25 LAB — LACTIC ACID, PLASMA: Lactic Acid, Venous: 0.5 mmol/L (ref 0.5–1.9)

## 2017-06-25 MED ORDER — SODIUM CHLORIDE 0.9 % IV SOLN
1.5000 g | Freq: Four times a day (QID) | INTRAVENOUS | Status: DC
  Administered 2017-06-25 – 2017-06-26 (×4): 1.5 g via INTRAVENOUS
  Filled 2017-06-25 (×6): qty 1.5

## 2017-06-25 MED ORDER — METRONIDAZOLE IN NACL 5-0.79 MG/ML-% IV SOLN
500.0000 mg | Freq: Three times a day (TID) | INTRAVENOUS | Status: DC
  Administered 2017-06-25: 500 mg via INTRAVENOUS
  Filled 2017-06-25: qty 100

## 2017-06-25 NOTE — Progress Notes (Signed)
ANTIBIOTIC CONSULT NOTE - INITIAL  Pharmacy Consult for Unasyn Indication: pneumonia  No Known Allergies  Patient Measurements: Height:  (160 cm) Weight: 106 lb 7.7 oz (48.3 kg) IBW/kg (Calculated) : 52.4  Vital Signs: Temp: 98.6 F (37 C) (05/27 0838) Temp Source: Oral (05/27 0838) BP: 109/76 (05/27 1610) Pulse Rate: 86 (05/27 0838) Intake/Output from previous day: 05/26 0701 - 05/27 0700 In: 2850 [P.O.:120; I.V.:2230; IV Piggyback:500] Out: 500 [Urine:500] Intake/Output from this shift: No intake/output data recorded.  Labs: Recent Labs    06/23/17 2240 06/23/17 2256 06/24/17 0205 06/25/17 0251  WBC 6.9  --   --  6.7  HGB 9.1* 9.5*  --  8.9*  PLT 323  --   --  274  CREATININE 0.83 1.10* 0.72 0.79   Estimated Creatinine Clearance: 71.3 mL/min (by C-G formula based on SCr of 0.79 mg/dL). No results for input(s): VANCOTROUGH, VANCOPEAK, VANCORANDOM, GENTTROUGH, GENTPEAK, GENTRANDOM, TOBRATROUGH, TOBRAPEAK, TOBRARND, AMIKACINPEAK, AMIKACINTROU, AMIKACIN in the last 72 hours.   Microbiology: Recent Results (from the past 720 hour(s))  MRSA PCR Screening     Status: None   Collection Time: 06/25/17  7:07 AM  Result Value Ref Range Status   MRSA by PCR NEGATIVE NEGATIVE Final    Comment:        The GeneXpert MRSA Assay (FDA approved for NASAL specimens only), is one component of a comprehensive MRSA colonization surveillance program. It is not intended to diagnose MRSA infection nor to guide or monitor treatment for MRSA infections. Performed at [facility], [address], [place], [place] [address]     Medical History: Past Medical History:  Diagnosis Date  . Alcoholic hepatitis   . Pneumonia   . TB (tuberculosis) ~ 2017   "took the RX and was healed" (05/04/2017)    Assessment:  ID: Possible PNA. Afebrile. LA 3.29. CrCl 73 Vanco 5/26>>5/27 Cefepim 5/26>> 5/27 Flagyl 5/27>>5/27 Unasyn 5/27>>  Goal of Therapy:  Eradication of  infection   Plan:  Start Unasyn 1.5g IV q 6 hrs. Home on Augmentin if abx continued  Dorthey Depace S. Merilynn Finland, PharmD, BCPS Clinical Staff Pharmacist Pager [phone]  Misty Stanley Stillinger 06/25/2017,12:26 PM

## 2017-06-25 NOTE — Plan of Care (Signed)
Discussed care of plan with patient.  Patient often said "okay" and "yes", but this nurse is not sure if patient understood everything being said.  Little teach back displayed.

## 2017-06-25 NOTE — Progress Notes (Signed)
PROGRESS NOTE    Susan Mcconnell  ZOX:[id number] DOB: December 19, 1975 DOA: 06/23/2017 PCP: Patient, No Pcp Per      Brief Narrative:  Susan Mcconnell is a 42 y.o. F with alcoholism, cirrhosis and history of TB who presents with unresponsive episode.  She reports "sleeping a lot" and that's why she was brought to the ER.  Notes from other providers indicate that family found her unresponsive, suspected alcohol intoxication and brought her to the ER where she was found to be intoxicated, with new pneumonia/aspiration on CXR, started on antibiotics and admitted to the hospital.   Assessment & Plan:  Community acquired pneumonia In alcoholic, found down.  Recent hospitalization, history of pulmonary TB, several years ago, treated reportedly.   MRSA negative.  Sepsis has been ruled out, her initial elevated lactic acid was from alcohol intoxication. Procalcitonin relatively low. -Stop vancomycin -Stop Cefepime, change to Unasyn -Trend procalcitonin -Follow blood and sputum cultures -Follow up echo  Liver disease, likely early cirrhosis Synthetic function of liver appears abnormal, mildly.   Admitted in similar circumstances in April.  Extensive work up in previous hospitalization.  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  She did not go to her GI follow up.  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  States she is 'drunk most of the time'."  Here, alcohol level 473 on admission.  RUQ [place] was repeated for some reason, just shows steatosis.  Ferritin a lot lower, 400s.  LFTs ~150 AST and normal ALT.  Maddrey only 22. -Alcohol abstinence  recommended  Alcoholism Reports regular alcohol use before last hospitalization, was sober for 2 weeks after that hospitalization, then relapsed because of social stressors at home (son wouldn't giver her money).  Unfortunately, to me although she affirms she would like to quit alcohol, she also claims and insists that she drinks only 1 10 to 12 oz beer per day, which is inconsistent with being found unconscious and intoxicated with EtOH conc 473 mg/dL. She does not appear to be withdrawing.  Denies previous historyo f withdrawal. -CIWA with on demand lorazepam -Alcohol cessation firmly recommended  Hypernatremia Resolved with fluids.  Anemia Probably this is bone marrow suppression from EtOH.    Pancytopenia resolved.  Smear suggested splenic dysfunction.  Also has not followed up with Hematology as scheduled.  Iron studies do not suggest iron deficiency.  B12 and folate normal.  Parvo and DAT negative last hospitalization.            DVT prophylaxis: SCDs Code Status: FULl Family Communication: None present MDM and disposition Plan: The below labs and imaging reports were reviewed and summarized above.  The patient's status is clinically improved.    The patient was admitted with unresponsive episode, alcohol intoxication, found to have aspiration pneumonia incidentally. She's been treated with IV antibiotics, and monitor her cholesterol. She has had a week of oxygen, she continues to improve, but we discussed supportive cessation.    Consultants:   None  Procedures:   Echocardiogram pending  Antimicrobials:   Vancomycin 5/25 >> 5/27  Cefepime 5/25 >> 5/27  Unasyn 5/27 >>    Subjective: Collected through nepali interpreter.  Feels  well.  No cough, congestion.  No dyspnea.  No headache, sweats, tremor.  No confusion, weakness.  Objective: Vitals:   06/24/17 2152 06/24/17 2316 06/25/17 0038 06/25/17 0838  BP:  92/61 102/73 109/76  Pulse:  83 78 86  Resp:  14  20   Temp:  98.5 F (36.9 C) 97.8 F (36.6 C) 98.6 F (37 C)  TempSrc:  Oral Oral Oral  SpO2:  99% 96% 100%  Weight: 48.3 kg (106 lb 7.7 oz)     Height:  (1.6 m)       Intake/Output Summary (Last 24 hours) at 06/25/2017 1107 Last data filed at 06/25/2017 0452 Gross per 24 hour  Intake 2850 ml  Output -  Net 2850 ml   Filed Weights   06/24/17 2152  Weight: 48.3 kg (106 lb 7.7 oz)    Examination: General appearance: Thin adult female, alert and in no acute distress.  Lying in bed, interactive. HEENT: Icterus noted, conjunctiva pink, lids and lashes normal. No nasal deformity, discharge, epistaxis.  Lips moist, teeth normal, OP moist, no oral lesions, hearing normal.   Skin: Warm and dry.  No jaundice.  No suspicious rashes or lesions. Cardiac: RRR, nl S1-S2, no murmurs appreciated.  Capillary refill is brisk.  JVP normale.  No LE edema.  Radial pulses 2+ and symmetric. Respiratory: Normal respiratory rate and rhythm.  CTAB without rales or wheezes. Abdomen: Abdomen soft.  No TTP. No ascites, distension, hepatosplenomegaly.   MSK: No deformities or effusions of the large joints of the upper or lower extremities bilaterally. Neuro: Awake and alert.  EOMI, moves all extremities. Speech fluent.    Psych: Sensorium intact and responding to questions, attention normal. Affect normal.  Judgment and insight appear normal.    Data Reviewed: I have personally reviewed following labs and imaging studies:  CBC: Recent Labs  Lab 06/23/17 2240 06/23/17 2256 06/25/17 0251  WBC 6.9  --  6.7  NEUTROABS 2.7  --   --   HGB 9.1* 9.5* 8.9*  HCT 27.7* 28.0* 27.6*  MCV 96.5  --  99.6  PLT 323  --  274   Basic Metabolic Panel: Recent Labs  Lab 06/23/17 2240 06/23/17 2256 06/24/17 0205 06/25/17 0251  NA 140 145 148* 141  K 3.8 4.1 3.9 3.9  CL 114* 115* 122* 117*  CO2 17*  --  16* 19*  GLUCOSE 84 79 82 59*  BUN 6 5* <5* <5*  CREATININE 0.83 1.10* 0.72 0.79  CALCIUM 6.9*  --  6.7*  7.6*   GFR: Estimated Creatinine Clearance: 71.3 mL/min (by C-G formula based on SCr of 0.79 mg/dL). Liver Function Tests: Recent Labs  Lab 06/23/17 2240 06/25/17 0251  AST 190* 149*  ALT 46 43  ALKPHOS 216* 202*  BILITOT 2.1* 2.3*  PROT 5.7* 5.3*  ALBUMIN 2.3* 2.1*   No results for input(s): LIPASE, AMYLASE in the last 168 hours. Recent Labs  Lab 06/23/17 2241  AMMONIA 50*   Coagulation Profile: No results for input(s): INR, PROTIME in the last 168 hours. Cardiac Enzymes: Recent Labs  Lab 06/24/17 1116 06/24/17 1520 06/24/17 1850  TROPONINI <0.03 <0.03 <0.03   BNP (last 3 results) No results for input(s): PROBNP in the last 8760 hours. HbA1C: No results for input(s): HGBA1C in the last 72 hours. CBG: Recent Labs  Lab 06/23/17 2235 06/25/17 0748  GLUCAP 77 83   Lipid Profile: No results for input(s): CHOL, HDL, LDLCALC, TRIG, CHOLHDL, LDLDIRECT in the last  72 hours. Thyroid Function Tests: No results for input(s): TSH, T4TOTAL, FREET4, T3FREE, THYROIDAB in the last 72 hours. Anemia Panel: Recent Labs    06/24/17 0532  VITAMINB12 212  FERRITIN 463*  TIBC 249*  IRON 109   Urine analysis:    Component Value Date/Time   COLORURINE STRAW (A) 06/24/2017 0010   APPEARANCEUR CLEAR 06/24/2017 0010   LABSPEC 1.004 (L) 06/24/2017 0010   PHURINE 6.0 06/24/2017 0010   GLUCOSEU NEGATIVE 06/24/2017 0010   HGBUR NEGATIVE 06/24/2017 0010   BILIRUBINUR NEGATIVE 06/24/2017 0010   KETONESUR NEGATIVE 06/24/2017 0010   PROTEINUR NEGATIVE 06/24/2017 0010   NITRITE NEGATIVE 06/24/2017 0010   LEUKOCYTESUR NEGATIVE 06/24/2017 0010   Sepsis Labs: (procalcitonin:4,lacticacidven:4)  ) Recent Results (from the past 240 hour(s))  MRSA PCR Screening     Status: None   Collection Time: 06/25/17  7:07 AM  Result Value Ref Range Status   MRSA by PCR NEGATIVE NEGATIVE Final    Comment:        The GeneXpert MRSA Assay (FDA approved for NASAL specimens only), is  one component of a comprehensive MRSA colonization surveillance program. It is not intended to diagnose MRSA infection nor to guide or monitor treatment for MRSA infections. Performed at [facility], [address], [place], [place] [address]          Radiology Studies: Dg Chest Port 1 View  Result Date: 06/24/2017 CLINICAL DATA:  Weakness, unresponsive EXAM: PORTABLE CHEST 1 VIEW COMPARISON:  06/12/2017, 05/03/2017 FINDINGS: Borderline cardiomegaly. Vascular congestion with diffuse interstitial opacities suspicious for pulmonary edema. Patchy airspace opacity at the right based and right apex. No pneumothorax. IMPRESSION: 1. Borderline cardiomegaly with vascular congestion and mild interstitial edema 2. Patchy airspace disease at the right apex and right base, possible superimposed pneumonia Electronically Signed   By: Jasmine Pang M.D.   On: 06/24/2017 02:24   US Abdomen Limited Ruq  Result Date: 06/24/2017 CLINICAL DATA:  Increasing liver function studies and bilirubin. Alcoholic hepatitis. EXAM: ULTRASOUND ABDOMEN LIMITED RIGHT UPPER QUADRANT COMPARISON:  CT abdomen and pelvis 05/04/2017 FINDINGS: Gallbladder: Gallbladder is distended with a small amount of sludge. Minimal pericholecystic edema with borderline wall thickness. No stones. Murphy's sign was negative. Common bile duct: Diameter: 4.3 mm, normal Liver: Diffusely increased hepatic parenchymal echotexture consistent with fatty infiltration. Portal vein is patent on color Doppler imaging with normal direction of blood flow towards the liver. IMPRESSION: Gallbladder distention and mild sludge. Mild pericholecystic edema. No stones. Changes are nonspecific but more likely represent chronic stasis rather than acute cholecystitis. Diffuse fatty infiltration of the liver. Electronically Signed   By: Burman Nieves M.D.   On: 06/24/2017 06:40        Scheduled Meds: Continuous Infusions: . ceFEPime (MAXIPIME) IV 1 g  (06/25/17 0620)  . metronidazole Stopped (06/25/17 0843)     LOS: 1 day    Time spent: 25 minutes    Alberteen Sam, MD [facility] 06/25/2017, 11:07 AM     Pager [phone] --- please page though AMION:  [email or web address] If 7PM-7AM, please contact night-coverage

## 2017-06-26 ENCOUNTER — Other Ambulatory Visit (HOSPITAL_COMMUNITY): Payer: Self-pay

## 2017-06-26 DIAGNOSIS — J690 Pneumonitis due to inhalation of food and vomit: Secondary | ICD-10-CM

## 2017-06-26 DIAGNOSIS — K7010 Alcoholic hepatitis without ascites: Secondary | ICD-10-CM

## 2017-06-26 DIAGNOSIS — F1020 Alcohol dependence, uncomplicated: Secondary | ICD-10-CM

## 2017-06-26 LAB — FOLATE RBC
FOLATE, HEMOLYSATE: 289.6 ng/mL
Folate, RBC: 1053 ng/mL (ref >498)
HEMATOCRIT: 27.5 % — AB (ref 34.0–46.6)

## 2017-06-26 LAB — PROTIME-INR
INR: 1.12
Prothrombin Time: 14.3 seconds (ref 11.4–15.2)

## 2017-06-26 LAB — GLUCOSE, CAPILLARY: GLUCOSE-CAPILLARY: 81 mg/dL (ref 65–99)

## 2017-06-26 LAB — PROCALCITONIN: PROCALCITONIN: 0.34 ng/mL

## 2017-06-26 MED ORDER — AMOXICILLIN-POT CLAVULANATE 875-125 MG PO TABS: 1.0000 | ORAL_TABLET | Freq: Two times a day (BID) | ORAL | 0 refills | Status: AC

## 2017-06-26 NOTE — Care Management Note (Addendum)
Case Management Note  Patient Details  Name: Susan Mcconnell MRN: [id number] Date of Birth: [birth year]  Subjective/Objective:  From home, for dc today, she has f/u apt with the [facility] and she will go to [facility] to get her abx.  Patient states she does not have any money , NCM will call [facility] to see if she can get one time free fill for abx prior to dc.  Patient 's ride is here and she states she has 3.00 to help get patient's meds, NCM gave patient a Match Letter to go to [facility] to get abx for 3.00.                   Action/Plan: DC home when ready.   Expected Discharge Date:  06/26/17               Expected Discharge Plan:  Home/Self Care  In-House Referral:     Discharge planning Services  CM Consult, Follow-up appt scheduled, [facility], Medication Assistance  Post Acute Care Choice:    Choice offered to:     DME Arranged:    DME Agency:     HH Arranged:    HH Agency:     Status of Service:  Completed, signed off  If discussed at [employer], dates discussed:    Additional Comments:  Leone Haven, RN 06/26/2017, 1:27 PM

## 2017-06-26 NOTE — Discharge Summary (Signed)
Physician Discharge Summary  Susan Mcconnell WJX:[id number] DOB: Oct 15, 1975 DOA: 06/23/2017  PCP: Patient, No Pcp Per  Admit date: 06/23/2017 Discharge date: 06/26/2017  Admitted From: Home  Disposition:  Home   Recommendations for Outpatient Follow-up:  1. Follow up with new PCP in 1 month 2. Please recheck LFTs, INR, albumin in 1 month 3. Please recheck CBC in 1 month 4. Please assist to reschedule GI appointment   Home Health: None  Equipment/Devices: None  Discharge Condition: Fair  CODE STATUS: FULL Diet recommendation: Regular  Brief/Interim Summary: Susan Mcconnell is a 42 y.o. F with alcoholism, cirrhosis and history of TB who presents with unresponsive episode.  She reports "sleeping a lot" and that's why she was brought to the ER.  Notes from other providers indicate that family found her unresponsive, suspected alcohol intoxication and brought her to the ER where she was found to be intoxicated, with new pneumonia/aspiration on CXR, started on antibiotics and admitted to the hospital.      Community acquired pneumonia Alcoholic, found down/intoxicated.  Suspect aspiration.  Initially given vanc/Cefepime, changed to Unasyn, discharged on Augmentin for 7 days total, she will get Rx filled for free at [facility]: [facility]onda W.  Low suspicion for pulmonary TB (had this years ago, treated in [place]).  MRSA negative, only risk factor for drug resistant organism or pseudomonas was recent hospitalization for intoxication, hepatitis (no antibiotics at that time).  Sepsis has been ruled out, her initial elevated lactic acid was from alcohol intoxication. Procalcitonin low, mild rise on hospital day 2.    Liver disease, possible early cirrhosis Synthetic function of liver appears diminished, possible early cirrhosis.  Admitted in similar circumstances in April.  Extensive work up in previous hospitalization.  At that time, US showed steatosis, hep serologies negative, ferritin up, Hemochromatosis genetics  showed carrier of C282Y allele, also ceruloplasmin moderately low.  She did not go to her GI follow up.  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 States she is 'drunk most of the time'."  Here, alcohol level 473 on admission.  RUQ [place] was repeated for some reason, just shows steatosis.  Ferritin a lot lower, 400s.  LFTs ~150 AST and normal ALT.  Maddrey only 22. -Alcohol abstinence recommended, patient denied that she drinks more than 1 beer per day, was defensive about her alcohol intake  Alcoholism Reports regular alcohol use before last hospitalization, was sober for 2 weeks after that hospitalization, then relapsed because of social stressors at home (son wouldn't giver her money).  Unfortunately, to me although she affirms she would like to quit alcohol, she also claims and insists that she drinks only one 10 to 12 oz beer per day, which is inconsistent with being found unconscious and intoxicated with EtOH conc 473 mg/dL. She does not appear to be withdrawing and denies previous history of withdrawals. Alcohol cessation strongly recommended, resources for substance use treatment given by [facility].  Hypernatremia Resolved with fluids.  Anemia Probably this is bone marrow suppression from EtOH.    Pancytopenia from her last hospitalization has resolved.  Smear suggested splenic dysfunction at that time.  Also has not followed up with Hematology as scheduled.  Iron studies do not suggest iron deficiency.  B12 and folate normal.  Parvo and DAT  negative last hospitalization.     Discharge Diagnoses:  Active Problems:   Sepsis due to pneumonia [facility])    Discharge Instructions  Discharge Instructions    Diet general   Complete by:  As directed     Discharge instructions   Complete by:  As directed    You were admitted with pneumonia and alcohol intoxication. You were treated with antibiotics here, and you should take Augmentin (an antibiotic) to finish treatment of the pneumonia infection. Take Augmentin 875-125 mg twice daily (in morning and at night) for 5 more days, until all gone  It is VERY important that you make a follow up at the [facility].  When you call them for an appointment, please ask for an appointment as soon as they are able.  Ask them to check your liver function.  It is also EXTREMELY important that you stop drinking alcohol completely.   Increase activity slowly   Complete by:  As directed      Allergies as of 06/26/2017   No Known Allergies     Medication List    TAKE these medications   amoxicillin-clavulanate 875-125 MG tablet Commonly known as:  AUGMENTIN Take 1 tablet by mouth 2 (two) times daily for 5 days.      Follow-up Information    [facility] Follow up on 07/26/2017.   Why:  2:30 for hospital follow up Contact information: [place] [address] [phone]       [facility] Follow up.   Why:  you can utilize this pharamacy for medication assistance Contact information: [address] [place] [address] [phone]         No Known Allergies  Consultations:  None   Procedures/Studies: Dg Chest 2 View  Result Date: 06/12/2017 CLINICAL DATA:  Jaundice and edema with cough, initial encounter EXAM: CHEST - 2 VIEW COMPARISON:  None. FINDINGS: The heart size and mediastinal contours are within normal limits. Both lungs are clear. The visualized skeletal structures are unremarkable. IMPRESSION: No active cardiopulmonary disease. Electronically Signed   By: Alcide Clever M.D.   On: 06/12/2017 11:48   Dg Chest Port 1  View  Result Date: 06/24/2017 CLINICAL DATA:  Weakness, unresponsive EXAM: PORTABLE CHEST 1 VIEW COMPARISON:  06/12/2017, 05/03/2017 FINDINGS: Borderline cardiomegaly. Vascular congestion with diffuse interstitial opacities suspicious for pulmonary edema. Patchy airspace opacity at the right based and right apex. No pneumothorax. IMPRESSION: 1. Borderline cardiomegaly with vascular congestion and mild interstitial edema 2. Patchy airspace disease at the right apex and right base, possible superimposed pneumonia Electronically Signed   By: Jasmine Pang M.D.   On: 06/24/2017 02:24   US Abdomen Limited Ruq  Result Date: 06/24/2017 CLINICAL DATA:  Increasing liver function studies and bilirubin. Alcoholic hepatitis. EXAM: ULTRASOUND ABDOMEN LIMITED RIGHT UPPER QUADRANT COMPARISON:  CT abdomen and pelvis 05/04/2017 FINDINGS: Gallbladder: Gallbladder is distended with a small amount of sludge. Minimal pericholecystic edema with borderline wall thickness. No stones. Murphy's sign was negative. Common bile duct: Diameter: 4.3 mm, normal Liver: Diffusely increased hepatic parenchymal echotexture consistent with fatty infiltration. Portal vein is patent on color Doppler imaging with normal direction of blood flow towards the liver. IMPRESSION: Gallbladder distention and mild sludge. Mild pericholecystic edema. No stones. Changes are nonspecific but more likely represent chronic stasis rather than acute cholecystitis. Diffuse fatty infiltration of the liver. Electronically Signed   By: Chrissie Noa  Andria Meuse M.D.   On: 06/24/2017 06:40       Subjective: Feels tired. No cough, sputum, no fever.  No respiratory distress.  Feels tired and weak, unsteady.  No vomiting, nausea.    Discharge Exam: Vitals:   06/26/17 0600 06/26/17 0727  BP: 95/62 107/72  Pulse: 80 78  Resp:  17  Temp:  98.1 F (36.7 C)  SpO2:  99%   Vitals:   06/25/17 1623 06/25/17 2342 06/26/17 0600 06/26/17 0727  BP: 100/64 102/70 95/62  107/72  Pulse: 88 84 80 78  Resp: 18   17  Temp: 99 F (37.2 C) 98.3 F (36.8 C)  98.1 F (36.7 C)  TempSrc: Oral Oral  Oral  SpO2: 100% 100%  99%  Weight:      Height:        General: Very thin adult female, sitting up in bed, interactive, Pt is alert, awake, not in acute distress, all interaction through telephonic interpreter Cardiovascular: RRR, S1/S2 +, no rubs, no gallops Respiratory: CTA bilaterally, no wheezing, no rhonchi Abdominal: Soft, NT, ND, bowel sounds + Extremities: no edema, no cyanosis    The results of significant diagnostics from this hospitalization (including imaging, microbiology, ancillary and laboratory) are listed below for reference.     Microbiology: Recent Results (from the past 240 hour(s))  Blood Culture (routine x 2)     Status: None (Preliminary result)   Collection Time: 06/24/17  2:44 AM  Result Value Ref Range Status   Specimen Description BLOOD RIGHT HAND  Final   Special Requests   Final    BOTTLES DRAWN AEROBIC ONLY Blood Culture results may not be optimal due to an inadequate volume of blood received in culture bottles   Culture   Final    NO GROWTH 1 DAY Performed at [facility], [address], [place], [place] [address]    Report Status PENDING  Incomplete  Blood Culture (routine x 2)     Status: None (Preliminary result)   Collection Time: 06/24/17  2:49 AM  Result Value Ref Range Status   Specimen Description BLOOD RIGHT ANTECUBITAL  Final   Special Requests   Final    BOTTLES DRAWN AEROBIC AND ANAEROBIC Blood Culture results may not be optimal due to an excessive volume of blood received in culture bottles   Culture   Final    NO GROWTH 1 DAY Performed at [facility], [address], [place], [place] [address]    Report Status PENDING  Incomplete  MRSA PCR Screening     Status: None   Collection Time: 06/25/17  7:07 AM  Result Value Ref Range Status   MRSA by PCR NEGATIVE NEGATIVE Final    Comment:         The GeneXpert MRSA Assay (FDA approved for NASAL specimens only), is one component of a comprehensive MRSA colonization surveillance program. It is not intended to diagnose MRSA infection nor to guide or monitor treatment for MRSA infections. Performed at [facility], [address] [place], [place] [address]      Labs: BNP (last 3 results) No results for input(s): BNP in the last 8760 hours. Basic Metabolic Panel: Recent Labs  Lab 06/23/17 2240 06/23/17 2256 06/24/17 0205 06/25/17 0251  NA 140 145 148* 141  K 3.8 4.1 3.9 3.9  CL 114* 115* 122* 117*  CO2 17*  --  16* 19*  GLUCOSE 84 79 82 59*  BUN 6 5* <  5* <5*  CREATININE 0.83 1.10* 0.72 0.79  CALCIUM 6.9*  --  6.7* 7.6*   Liver Function Tests: Recent Labs  Lab 06/23/17 2240 06/25/17 0251  AST 190* 149*  ALT 46 43  ALKPHOS 216* 202*  BILITOT 2.1* 2.3*  PROT 5.7* 5.3*  ALBUMIN 2.3* 2.1*   No results for input(s): LIPASE, AMYLASE in the last 168 hours. Recent Labs  Lab 06/23/17 2241  AMMONIA 50*   CBC: Recent Labs  Lab 06/23/17 2240 06/23/17 2256 06/25/17 0251  WBC 6.9  --  6.7  NEUTROABS 2.7  --   --   HGB 9.1* 9.5* 8.9*  HCT 27.7* 28.0* 27.6*  MCV 96.5  --  99.6  PLT 323  --  274   Cardiac Enzymes: Recent Labs  Lab 06/24/17 1116 06/24/17 1520 06/24/17 1850  TROPONINI <0.03 <0.03 <0.03   BNP: Invalid input(s): POCBNP CBG: Recent Labs  Lab 06/23/17 2235 06/25/17 0748 06/26/17 0724  GLUCAP 77 83 81   D-Dimer No results for input(s): DDIMER in the last 72 hours. Hgb A1c No results for input(s): HGBA1C in the last 72 hours. Lipid Profile No results for input(s): CHOL, HDL, LDLCALC, TRIG, CHOLHDL, LDLDIRECT in the last 72 hours. Thyroid function studies No results for input(s): TSH, T4TOTAL, T3FREE, THYROIDAB in the last 72 hours.  Invalid input(s): FREET3 Anemia work up Recent Labs    06/24/17 0532  VITAMINB12 212  FERRITIN 463*  TIBC 249*  IRON 109    Urinalysis    Component Value Date/Time   COLORURINE STRAW (A) 06/24/2017 0010   APPEARANCEUR CLEAR 06/24/2017 0010   LABSPEC 1.004 (L) 06/24/2017 0010   PHURINE 6.0 06/24/2017 0010   GLUCOSEU NEGATIVE 06/24/2017 0010   HGBUR NEGATIVE 06/24/2017 0010   BILIRUBINUR NEGATIVE 06/24/2017 0010   KETONESUR NEGATIVE 06/24/2017 0010   PROTEINUR NEGATIVE 06/24/2017 0010   NITRITE NEGATIVE 06/24/2017 0010   LEUKOCYTESUR NEGATIVE 06/24/2017 0010   Sepsis Labs Invalid input(s): PROCALCITONIN,  WBC,  LACTICIDVEN Microbiology Recent Results (from the past 240 hour(s))  Blood Culture (routine x 2)     Status: None (Preliminary result)   Collection Time: 06/24/17  2:44 AM  Result Value Ref Range Status   Specimen Description BLOOD RIGHT HAND  Final   Special Requests   Final    BOTTLES DRAWN AEROBIC ONLY Blood Culture results may not be optimal due to an inadequate volume of blood received in culture bottles   Culture   Final    NO GROWTH 1 DAY Performed at [facility], [address], [place], [place] [address]    Report Status PENDING  Incomplete  Blood Culture (routine x 2)     Status: None (Preliminary result)   Collection Time: 06/24/17  2:49 AM  Result Value Ref Range Status   Specimen Description BLOOD RIGHT ANTECUBITAL  Final   Special Requests   Final    BOTTLES DRAWN AEROBIC AND ANAEROBIC Blood Culture results may not be optimal due to an excessive volume of blood received in culture bottles   Culture   Final    NO GROWTH 1 DAY Performed at [facility], [address] [place], [place] [address]    Report Status PENDING  Incomplete  MRSA PCR Screening     Status: None   Collection Time: 06/25/17  7:07 AM  Result Value Ref Range Status   MRSA by PCR NEGATIVE NEGATIVE Final    Comment:  The GeneXpert MRSA Assay (FDA approved for NASAL specimens only), is one component of a comprehensive MRSA colonization surveillance program. It is not intended to  diagnose MRSA infection nor to guide or monitor treatment for MRSA infections. Performed at [facility], [address], [place], [place] [address]      Time coordinating discharge: 40 minutes       SIGNED:   Alberteen Sam, MD  [facility] 06/26/2017, 1:00 PM

## 2017-06-26 NOTE — Progress Notes (Signed)
CSW met with patient to discuss alcohol cessation resources. CSW utilized video interpreting services to communicate with the patient. CSW explained that the doctor is concerned about the patient's drinking, especially with her medications and that she needs to not drink for her health. CSW discussed with patient and provided resources for alcohol services in the community. CSW acknowledged that unfortunately the resource list is in [place], and unsure of what community resources would have a [name] interpreter for her to use. Patient acknowledged that she knows that her drinking is dangerous and that she has three kids at home and she needs to stop, she will be able to stop drinking and has friends that can help her if she needs support.  CSW signing off.  Laveda Abbe, [place] Clinical Social Worker [phone]

## 2017-06-29 LAB — CULTURE, BLOOD (ROUTINE X 2)
CULTURE: NO GROWTH
CULTURE: NO GROWTH

## 2017-07-26 ENCOUNTER — Ambulatory Visit (INDEPENDENT_AMBULATORY_CARE_PROVIDER_SITE_OTHER): Payer: Self-pay | Admitting: Physician Assistant

## 2017-08-15 ENCOUNTER — Encounter (HOSPITAL_COMMUNITY): Payer: Self-pay | Admitting: Internal Medicine

## 2017-08-15 ENCOUNTER — Observation Stay (HOSPITAL_COMMUNITY): Payer: Medicaid Other

## 2017-08-15 ENCOUNTER — Inpatient Hospital Stay (HOSPITAL_COMMUNITY)
Admission: EM | Admit: 2017-08-15 | Discharge: 2017-08-17 | DRG: 897 | Disposition: A | Discharge status: Home / Self Care | Payer: Medicaid Other | Attending: Internal Medicine | Admitting: Internal Medicine

## 2017-08-15 ENCOUNTER — Other Ambulatory Visit: Payer: Self-pay

## 2017-08-15 DIAGNOSIS — E861 Hypovolemia: Secondary | ICD-10-CM | POA: Diagnosis present

## 2017-08-15 DIAGNOSIS — D649 Anemia, unspecified: Secondary | ICD-10-CM | POA: Diagnosis present

## 2017-08-15 DIAGNOSIS — R945 Abnormal results of liver function studies: Secondary | ICD-10-CM | POA: Diagnosis present

## 2017-08-15 DIAGNOSIS — Z720 Tobacco use: Secondary | ICD-10-CM

## 2017-08-15 DIAGNOSIS — R7401 Elevation of levels of liver transaminase levels: Secondary | ICD-10-CM

## 2017-08-15 DIAGNOSIS — R7989 Other specified abnormal findings of blood chemistry: Secondary | ICD-10-CM | POA: Diagnosis present

## 2017-08-15 DIAGNOSIS — F10920 Alcohol use, unspecified with intoxication, uncomplicated: Secondary | ICD-10-CM | POA: Diagnosis present

## 2017-08-15 DIAGNOSIS — E876 Hypokalemia: Secondary | ICD-10-CM | POA: Diagnosis present

## 2017-08-15 DIAGNOSIS — R4689 Other symptoms and signs involving appearance and behavior: Secondary | ICD-10-CM

## 2017-08-15 DIAGNOSIS — K7010 Alcoholic hepatitis without ascites: Secondary | ICD-10-CM | POA: Diagnosis present

## 2017-08-15 DIAGNOSIS — Y908 Blood alcohol level of 240 mg/100 ml or more: Secondary | ICD-10-CM | POA: Diagnosis present

## 2017-08-15 DIAGNOSIS — R740 Nonspecific elevation of levels of transaminase and lactic acid dehydrogenase [LDH]: Secondary | ICD-10-CM | POA: Diagnosis present

## 2017-08-15 DIAGNOSIS — F1020 Alcohol dependence, uncomplicated: Secondary | ICD-10-CM

## 2017-08-15 DIAGNOSIS — E860 Dehydration: Secondary | ICD-10-CM | POA: Diagnosis present

## 2017-08-15 DIAGNOSIS — G9340 Encephalopathy, unspecified: Secondary | ICD-10-CM

## 2017-08-15 DIAGNOSIS — Z8611 Personal history of tuberculosis: Secondary | ICD-10-CM

## 2017-08-15 DIAGNOSIS — F1010 Alcohol abuse, uncomplicated: Secondary | ICD-10-CM

## 2017-08-15 DIAGNOSIS — E440 Moderate protein-calorie malnutrition: Secondary | ICD-10-CM | POA: Diagnosis present

## 2017-08-15 DIAGNOSIS — Z23 Encounter for immunization: Secondary | ICD-10-CM

## 2017-08-15 DIAGNOSIS — R45851 Suicidal ideations: Secondary | ICD-10-CM

## 2017-08-15 DIAGNOSIS — E7220 Disorder of urea cycle metabolism, unspecified: Secondary | ICD-10-CM | POA: Diagnosis present

## 2017-08-15 DIAGNOSIS — E870 Hyperosmolality and hypernatremia: Secondary | ICD-10-CM

## 2017-08-15 DIAGNOSIS — F10220 Alcohol dependence with intoxication, uncomplicated: Principal | ICD-10-CM | POA: Diagnosis present

## 2017-08-15 DIAGNOSIS — F10929 Alcohol use, unspecified with intoxication, unspecified: Secondary | ICD-10-CM | POA: Diagnosis present

## 2017-08-15 DIAGNOSIS — I9589 Other hypotension: Secondary | ICD-10-CM | POA: Diagnosis present

## 2017-08-15 DIAGNOSIS — K7460 Unspecified cirrhosis of liver: Secondary | ICD-10-CM | POA: Diagnosis present

## 2017-08-15 LAB — COMPREHENSIVE METABOLIC PANEL
ALBUMIN: 2.8 g/dL — AB (ref 3.5–5.0)
ALK PHOS: 225 U/L — AB (ref 38–126)
ALT: 50 U/L — AB (ref 0–44)
AST: 245 U/L — ABNORMAL HIGH (ref 15–41)
Anion gap: 11 (ref 5–15)
BUN: 6 mg/dL (ref 6–20)
CALCIUM: 7.6 mg/dL — AB (ref 8.9–10.3)
CO2: 20 mmol/L — AB (ref 22–32)
CREATININE: 0.75 mg/dL (ref 0.44–1.00)
Chloride: 120 mmol/L — ABNORMAL HIGH (ref 98–111)
GFR calc Af Amer: >60 mL/min (ref >60)
GFR calc non Af Amer: >60 mL/min (ref >60)
GLUCOSE: 99 mg/dL (ref 70–99)
Potassium: 2.9 mmol/L — ABNORMAL LOW (ref 3.5–5.1)
Sodium: 151 mmol/L — ABNORMAL HIGH (ref 135–145)
Total Bilirubin: 0.7 mg/dL (ref 0.3–1.2)
Total Protein: 6.3 g/dL — ABNORMAL LOW (ref 6.5–8.1)

## 2017-08-15 LAB — CBC
HCT: 31.4 % — ABNORMAL LOW (ref 36.0–46.0)
Hemoglobin: 10.6 g/dL — ABNORMAL LOW (ref 12.0–15.0)
MCH: 32.4 pg (ref 26.0–34.0)
MCHC: 33.8 g/dL (ref 30.0–36.0)
MCV: 96 fL (ref 78.0–100.0)
PLATELETS: 226 10*3/uL (ref 150–400)
RBC: 3.27 MIL/uL — ABNORMAL LOW (ref 3.87–5.11)
RDW: 19 % — AB (ref 11.5–15.5)
WBC: 5.8 10*3/uL (ref 4.0–10.5)

## 2017-08-15 LAB — ACETAMINOPHEN LEVEL: Acetaminophen (Tylenol), Serum: <10 ug/mL — ABNORMAL LOW (ref 10–30)

## 2017-08-15 LAB — ETHANOL: Alcohol, Ethyl (B): 535 mg/dL (ref <10)

## 2017-08-15 LAB — HCG, SERUM, QUALITATIVE: Preg, Serum: NEGATIVE

## 2017-08-15 LAB — AMMONIA: Ammonia: 74 umol/L — ABNORMAL HIGH (ref 9–35)

## 2017-08-15 LAB — PROTIME-INR
INR: 1.03
Prothrombin Time: 13.4 seconds (ref 11.4–15.2)

## 2017-08-15 LAB — SALICYLATE LEVEL: Salicylate Lvl: <7 mg/dL (ref 2.8–30.0)

## 2017-08-15 MED ORDER — POTASSIUM CHLORIDE IN NACL 20-0.45 MEQ/L-% IV SOLN
INTRAVENOUS | Status: DC
  Administered 2017-08-16 (×2): via INTRAVENOUS
  Filled 2017-08-15 (×4): qty 1000

## 2017-08-15 MED ORDER — POTASSIUM CHLORIDE 10 MEQ/100ML IV SOLN
10.0000 meq | Freq: Once | INTRAVENOUS | Status: AC
  Administered 2017-08-15: 10 meq via INTRAVENOUS
  Filled 2017-08-15: qty 100

## 2017-08-15 MED ORDER — ADULT MULTIVITAMIN W/MINERALS CH
1.0000 | ORAL_TABLET | Freq: Every day | ORAL | Status: DC
  Administered 2017-08-16 – 2017-08-17 (×2): 1 via ORAL
  Filled 2017-08-15 (×2): qty 1

## 2017-08-15 MED ORDER — THIAMINE HCL 100 MG/ML IJ SOLN
100.0000 mg | Freq: Every day | INTRAMUSCULAR | Status: DC
  Administered 2017-08-16: 100 mg via INTRAVENOUS
  Filled 2017-08-15: qty 2

## 2017-08-15 MED ORDER — VITAMIN B-1 100 MG PO TABS: 100.0000 mg | ORAL_TABLET | Freq: Every day | ORAL | Status: DC

## 2017-08-15 MED ORDER — FOLIC ACID 1 MG PO TABS
1.0000 mg | ORAL_TABLET | Freq: Every day | ORAL | Status: DC
  Administered 2017-08-16 – 2017-08-17 (×2): 1 mg via ORAL
  Filled 2017-08-15 (×2): qty 1

## 2017-08-15 MED ORDER — LORAZEPAM 2 MG/ML IJ SOLN
0.0000 mg | Freq: Four times a day (QID) | INTRAMUSCULAR | Status: DC
  Administered 2017-08-15: 2 mg via INTRAVENOUS
  Filled 2017-08-15: qty 1

## 2017-08-15 MED ORDER — VITAMIN B-1 100 MG PO TABS
100.0000 mg | ORAL_TABLET | Freq: Every day | ORAL | Status: DC
  Administered 2017-08-17: 100 mg via ORAL
  Filled 2017-08-15: qty 1

## 2017-08-15 MED ORDER — THIAMINE HCL 100 MG/ML IJ SOLN: 100.0000 mg | Freq: Every day | INTRAMUSCULAR | Status: DC

## 2017-08-15 MED ORDER — LORAZEPAM 2 MG/ML IJ SOLN: 1.0000 mg | Freq: Four times a day (QID) | INTRAMUSCULAR | Status: DC | PRN

## 2017-08-15 MED ORDER — PNEUMOCOCCAL VAC POLYVALENT 25 MCG/0.5ML IJ INJ
0.5000 mL | INJECTION | INTRAMUSCULAR | Status: AC
  Administered 2017-08-17: 0.5 mL via INTRAMUSCULAR
  Filled 2017-08-15 (×2): qty 0.5

## 2017-08-15 MED ORDER — THIAMINE HCL 100 MG/ML IJ SOLN
100.0000 mg | Freq: Once | INTRAMUSCULAR | Status: AC
  Administered 2017-08-15: 100 mg via INTRAMUSCULAR
  Filled 2017-08-15: qty 2

## 2017-08-15 MED ORDER — POTASSIUM CHLORIDE 10 MEQ/100ML IV SOLN
10.0000 meq | INTRAVENOUS | Status: AC
  Administered 2017-08-16 (×3): 10 meq via INTRAVENOUS
  Filled 2017-08-15 (×3): qty 100

## 2017-08-15 MED ORDER — LACTATED RINGERS IV SOLN
INTRAVENOUS | Status: DC
  Administered 2017-08-15: 23:00:00 via INTRAVENOUS

## 2017-08-15 MED ORDER — LACTATED RINGERS IV BOLUS
1000.0000 mL | Freq: Once | INTRAVENOUS | Status: AC
  Administered 2017-08-15: 1000 mL via INTRAVENOUS

## 2017-08-15 MED ORDER — LORAZEPAM 1 MG PO TABS: 0.0000 mg | ORAL_TABLET | Freq: Two times a day (BID) | ORAL | Status: DC

## 2017-08-15 MED ORDER — SODIUM CHLORIDE 0.9 % IV BOLUS
500.0000 mL | Freq: Once | INTRAVENOUS | Status: AC
  Administered 2017-08-15: 500 mL via INTRAVENOUS

## 2017-08-15 MED ORDER — LORAZEPAM 2 MG/ML IJ SOLN: 0.0000 mg | Freq: Two times a day (BID) | INTRAMUSCULAR | Status: DC

## 2017-08-15 MED ORDER — ONDANSETRON HCL 4 MG PO TABS: 4.0000 mg | ORAL_TABLET | Freq: Four times a day (QID) | ORAL | Status: DC | PRN

## 2017-08-15 MED ORDER — IBUPROFEN 200 MG PO TABS: 600.0000 mg | ORAL_TABLET | Freq: Three times a day (TID) | ORAL | Status: DC | PRN

## 2017-08-15 MED ORDER — LORAZEPAM 1 MG PO TABS: 0.0000 mg | ORAL_TABLET | Freq: Four times a day (QID) | ORAL | Status: DC

## 2017-08-15 MED ORDER — LORAZEPAM 1 MG PO TABS: 1.0000 mg | ORAL_TABLET | Freq: Four times a day (QID) | ORAL | Status: DC | PRN

## 2017-08-15 MED ORDER — ONDANSETRON HCL 4 MG/2ML IJ SOLN: 4.0000 mg | Freq: Four times a day (QID) | INTRAMUSCULAR | Status: DC | PRN

## 2017-08-15 NOTE — ED Provider Notes (Addendum)
[facility]-EMERGENCY DEPT Provider Note   CSN: [id number] Arrival date & time: 08/15/17  1730     History   Chief Complaint Chief Complaint  Patient presents with  . Alcohol Intoxication    SI    HPI Susan Mcconnell is a 42 y.o. female.  HPI Patient is brought to the emergency department with severe alcohol intoxication.  Family members had reported concern for the patient's increasing alcohol consumption and suicidal statements.  Patient's nurse reports that during her interview the patient did endorse suicidal thoughts.  During the interview the patient was lamenting that neither she nor her son had a job.  She expressed sadness and tearfulness.  Due to language barrier and patient's lack of cooperation, history is limited. Past Medical History:  Diagnosis Date  . Alcoholic hepatitis   . Pneumonia   . TB (tuberculosis) ~ 2017   "took the RX and was healed" (05/04/2017)    Patient Active Problem List   Diagnosis Date Noted  . Hypernatremia 08/15/2017  . Suicidal ideation 08/15/2017  . Alcohol intoxication (HCC) 08/15/2017  . Sepsis due to pneumonia (HCC) 06/24/2017  . Alcoholic intoxication without complication (HCC)   . HCAP (healthcare-associated pneumonia)   . Hypotension due to hypovolemia   . Hepatitis   . Hypertriglyceridemia 05/06/2017  . Compensated metabolic acidosis 05/06/2017  . Pancytopenia (HCC)   . Thrombocytopenia (HCC) 05/04/2017  . Hypokalemia 05/04/2017  . Abnormal LFTs 05/04/2017  . Chest pain 05/04/2017  . Protein-calorie malnutrition, moderate (HCC) 05/04/2017  . Elevated liver enzymes     Past Surgical History:  Procedure Laterality Date  . OTHER SURGICAL HISTORY  2010   "birth control surgery; don't know what they did"     OB History   None      Home Medications    Prior to Admission medications   Not on File    Family History Family History  Family history unknown: Yes    Social History Social History     Tobacco Use  . Smoking status: Never Smoker  . Smokeless tobacco: Current User    Types: Chew  Substance Use Topics  . Alcohol use: Yes    Alcohol/week: 12.6 oz    Types: 21 Cans of beer per week    Comment: 05/04/2017 "1 can, 3 times/day"  . Drug use: Never     Allergies   Patient has no known allergies.   Review of Systems Review of Systems 5 caveat language barrier and acute, severe alcohol intoxication for unreliable historian.  Physical Exam Updated Vital Signs BP (!) 84/64 (BP Location: Right Arm)   Pulse 79   Temp (!) 97.3 F (36.3 C) (Oral)   Resp (!) 21   LMP  (LMP Unknown)   SpO2 100%  General appearance: Patient is lying on stretcher sleeping but awakens.  She is then tearful.  No respiratory distress. Head: Normocephalic atraumatic ENT: Lucas membranes moist posterior pharynx widely patent. Neck: Supple Heart: Tachycardic no gross rub murmur gallop Lungs: Clear to auscultation no gross wheeze rhonchi rales Abdomen: Soft no guarding Extremities no deformities or apparent injuries.  Patient can move all extremities at will. Neurologic: Patient is intermittently somnolent and other times tearful and vocalizing.  No focal neurologic deficits. Skin: Warm and dry Psychiatric: Labile mood.    ED Treatments / Results  Labs (all labs ordered are listed, but only abnormal results are displayed) Labs Reviewed  COMPREHENSIVE METABOLIC PANEL - Abnormal; Notable for the following components:  Result Value   Sodium 151 (*)    Potassium 2.9 (*)    Chloride 120 (*)    CO2 20 (*)    Calcium 7.6 (*)    Total Protein 6.3 (*)    Albumin 2.8 (*)    AST 245 (*)    ALT 50 (*)    Alkaline Phosphatase 225 (*)    All other components within normal limits  ETHANOL - Abnormal; Notable for the following components:   Alcohol, Ethyl (B) 535 (*)    All other components within normal limits  CBC - Abnormal; Notable for the following components:   RBC 3.27 (*)     Hemoglobin 10.6 (*)    HCT 31.4 (*)    RDW 19.0 (*)    All other components within normal limits  ACETAMINOPHEN LEVEL - Abnormal; Notable for the following components:   Acetaminophen (Tylenol), Serum <10 (*)    All other components within normal limits  AMMONIA - Abnormal; Notable for the following components:   Ammonia 74 (*)    All other components within normal limits  SALICYLATE LEVEL  HCG, SERUM, QUALITATIVE  PROTIME-INR  RAPID URINE DRUG SCREEN, HOSP PERFORMED  BASIC METABOLIC PANEL  HEPATIC FUNCTION PANEL  CBC WITH DIFFERENTIAL/PLATELET  MAGNESIUM  URINALYSIS, ROUTINE W REFLEX MICROSCOPIC  SODIUM, URINE, RANDOM    EKG None  Radiology Dg Chest Port 1 View  Result Date: 08/15/2017 CLINICAL DATA:  42 y/o  F; acute encephalopathy. EXAM: PORTABLE CHEST 1 VIEW COMPARISON:  06/24/2017 chest radiograph. FINDINGS: Stable borderline cardiomegaly given projection and technique. Pulmonary vascular congestion. No consolidation, effusion, or pneumothorax. Bones are unremarkable. IMPRESSION: Stable borderline cardiomegaly. Pulmonary vascular congestion. No consolidation. Electronically Signed   By: Mitzi HansenLance  Furusawa-Stratton M.D.   On: 08/15/2017 23:41    Procedures Procedures (including critical care time) CRITICAL CARE Performed by: Arby BarretteMarcy Raimundo Corbit   Total critical care time: 30 minutes  Critical care time was exclusive of separately billable procedures and treating other patients.  Critical care was necessary to treat or prevent imminent or life-threatening deterioration.  Critical care was time spent personally by me on the following activities: development of treatment plan with patient and/or surrogate as well as nursing, discussions with consultants, evaluation of patient's response to treatment, examination of patient, obtaining history from patient or surrogate, ordering and performing treatments and interventions, ordering and review of laboratory studies, ordering and  review of radiographic studies, pulse oximetry and re-evaluation of patient's condition. Medications Ordered in ED Medications  pneumococcal 23 valent vaccine (PNU-IMMUNE) injection 0.5 mL (has no administration in time range)  ondansetron (ZOFRAN) tablet 4 mg (has no administration in time range)    Or  ondansetron (ZOFRAN) injection 4 mg (has no administration in time range)  0.45 % NaCl with KCl 20 mEq / L infusion (has no administration in time range)  potassium chloride 10 mEq in 100 mL IVPB (has no administration in time range)  sodium chloride 0.9 % bolus 500 mL (has no administration in time range)  LORazepam (ATIVAN) tablet 1 mg (has no administration in time range)    Or  LORazepam (ATIVAN) injection 1 mg (has no administration in time range)  thiamine (VITAMIN B-1) tablet 100 mg (has no administration in time range)    Or  thiamine (B-1) injection 100 mg (has no administration in time range)  folic acid (FOLVITE) tablet 1 mg (has no administration in time range)  multivitamin with minerals tablet 1 tablet (has no administration in time  range)  thiamine (B-1) injection 100 mg (100 mg Intramuscular Given 08/15/17 1851)  lactated ringers bolus 1,000 mL (1,000 mLs Intravenous New Bag/Given 08/15/17 2154)  potassium chloride 10 mEq in 100 mL IVPB (10 mEq Intravenous New Bag/Given 08/15/17 2135)     Initial Impression / Assessment and Plan / ED Course  I have reviewed the triage vital signs and the nursing notes.  Pertinent labs & imaging results that were available during my care of the patient were reviewed by me and considered in my medical decision making (see chart for details).     Consult: Dr. Toniann Fail for admission.  Final Clinical Impressions(s) / ED Diagnoses   Final diagnoses:  Acute alcoholic intoxication with complication (HCC)  Alcohol abuse  Suicidal ideation  Hypernatremia  Transaminitis  Combative behavior   Patient is [place] and history is somewhat  limited.  Intermittently, patient makes commentary in English but at other times is uncooperative.  Patient however has history of sporadic very heavy alcohol use.  Today alcohol level is greater than 500.  Reportedly the patient has had suicidal ideation.  She will need evaluation by behavioral health once alcohol has metabolized and she can conduct the interview.  She however also has significant hypernatremia likely related to dehydration and heavy alcohol use.  She also has transaminitis which she has periodically had in the past.  Patient has periodically been combative and at other times somnolent.  I do not feel patient is stable for medical clearance for suicide ideation.  Patient will first need to metabolize alcohol and correction of electrolytes for medical clearance,  will be for admission to hospitalist service. ED Discharge Orders    None       Arby Barrette, MD 08/15/17 2352    Arby Barrette, MD 08/16/17 0000

## 2017-08-15 NOTE — ED Notes (Signed)
Bed: [id number] Expected date:  Expected time:  Means of arrival:  Comments: rm 26

## 2017-08-15 NOTE — ED Notes (Signed)
Dr. Donnald GarrePfeiffer notified of critical ETOH level of 535. No new orders received at this time.

## 2017-08-15 NOTE — ED Notes (Signed)
Patient belligerent and unable to be redirected. Pt pulled IV site out and slung blood all over her bed and railing. New linens applied. Patient screaming loudly in an unintelligible manor. Patient repeatedly attempting to get out of bed, but is unsteady and unsafe. Bilateral wrists restraints applied for safety; Dr. Donnald GarrePfeiffer aware and orders placed. Per MD, pt to be moved out to the main ER for medical admission. Victorino DikeJennifer, Charge nurse aware of need for bed. Sitter at bedside for safety.

## 2017-08-15 NOTE — ED Notes (Signed)
Labs drawn, pt passed out intox while seeing the physician.

## 2017-08-15 NOTE — ED Notes (Signed)
ED TO INPATIENT HANDOFF REPORT  Name/Age/Gender Susan Mcconnell 42 y.o. female  Code Status    Code Status Orders  (From admission, onward)        Start     Ordered   08/15/17 1822  Full code  Continuous     08/15/17 1822    Code Status History    Date Active Date Inactive Code Status Order ID Comments User Context   06/24/2017 0532 06/26/2017 1651 Full Code [id number]  Harvie Bridge, DO ED   05/04/2017 0226 05/07/2017 2115 Full Code [id number]  Ivor Costa, MD ED      Home/SNF/Other Home  Chief Complaint ETOH; SI  Level of Care/Admitting Diagnosis ED Disposition    ED Disposition Condition [facility] Area: [facility] [[id number]]  Level of Care: Telemetry [5]  Admit to tele based on following criteria: Monitor for Ischemic changes  Diagnosis: Hypernatremia [557322]  Admitting Physician: [name] [phone]  Attending Physician: [name] [phone]  PT Class (Do Not Modify): Observation [104]  PT Acc Code (Do Not Modify): Observation [10022]       Medical History Past Medical History:  Diagnosis Date  . Alcoholic hepatitis   . Pneumonia   . TB (tuberculosis) ~ 2017   "took the RX and was healed" (05/04/2017)    Allergies No Known Allergies  IV Location/Drains/Wounds Patient Lines/Drains/Airways Status   Active Line/Drains/Airways    Name:   Placement date:   Placement time:   Site:   Days:   Peripheral IV 08/15/17 Left Antecubital   08/15/17    2111    Antecubital   less than 1          Labs/Imaging Results for orders placed or performed during the hospital encounter of 08/15/17 (from the past 48 hour(s))  Comprehensive metabolic panel     Status: Abnormal   Collection Time: 08/15/17  6:06 PM  Result Value Ref Range   Sodium 151 (H) 135 - 145 mmol/L   Potassium 2.9 (L) 3.5 - 5.1 mmol/L   Chloride 120 (H) 98 - 111 mmol/L    Comment: Please note change in reference range.   CO2 20 (L) 22 - 32 mmol/L    Glucose, Bld 99 70 - 99 mg/dL    Comment: Please note change in reference range.   BUN 6 6 - 20 mg/dL    Comment: Please note change in reference range.   Creatinine, Ser 0.75 0.44 - 1.00 mg/dL   Calcium 7.6 (L) 8.9 - 10.3 mg/dL   Total Protein 6.3 (L) 6.5 - 8.1 g/dL   Albumin 2.8 (L) 3.5 - 5.0 g/dL   AST 245 (H) 15 - 41 U/L   ALT 50 (H) 0 - 44 U/L    Comment: Please note change in reference range.   Alkaline Phosphatase 225 (H) 38 - 126 U/L   Total Bilirubin 0.7 0.3 - 1.2 mg/dL   GFR calc non Af Amer >60 >60 mL/min   GFR calc Af Amer >60 >60 mL/min    Comment: (NOTE) The eGFR has been calculated using the CKD EPI equation. This calculation has not been validated in all clinical situations. eGFR's persistently <60 mL/min signify possible Chronic Kidney Disease.    Anion gap 11 5 - 15    Comment: Performed at [facility], [place] [address], [place], [place] [address]  Ethanol     Status: Abnormal   Collection Time: 08/15/17  6:06 PM  Result Value Ref Range   Alcohol, Ethyl (B) 535 (HH) <10 mg/dL    Comment: CRITICAL RESULT CALLED TO, READ BACK BY AND VERIFIED WITH: MICHELLE SADLER,RN [id number] @ [place] (NOTE) Lowest detectable limit for serum alcohol is 10 mg/dL. For medical purposes only. Performed at [facility], [place] [address] [place], [place] [address]   cbc     Status: Abnormal   Collection Time: 08/15/17  6:06 PM  Result Value Ref Range   WBC 5.8 4.0 - 10.5 K/uL   RBC 3.27 (L) 3.87 - 5.11 MIL/uL   Hemoglobin 10.6 (L) 12.0 - 15.0 g/dL   HCT 31.4 (L) 36.0 - 46.0 %   MCV 96.0 78.0 - 100.0 fL   MCH 32.4 26.0 - 34.0 pg   MCHC 33.8 30.0 - 36.0 g/dL   RDW 19.0 (H) 11.5 - 15.5 %   Platelets 226 150 - 400 K/uL    Comment: Performed at [facility], [place] [address], [place], [place] [address]  Salicylate level     Status: None   Collection Time: 08/15/17  6:06 PM  Result Value Ref Range   Salicylate Lvl <1.4  2.8 - 30.0 mg/dL    Comment: Performed at [facility], [place] [address] [place], [place] [address]  Acetaminophen level     Status: Abnormal   Collection Time: 08/15/17  6:06 PM  Result Value Ref Range   Acetaminophen (Tylenol), Serum <10 (L) 10 - 30 ug/mL    Comment: (NOTE) Therapeutic concentrations vary significantly. A range of 10-30 ug/mL  may be an effective concentration for many patients. However, some  are best treated at concentrations outside of this range. Acetaminophen concentrations >150 ug/mL at 4 hours after ingestion  and >50 ug/mL at 12 hours after ingestion are often associated with  toxic reactions. Performed at [facility], [place] [address], [place],  [address]    No results found.  Pending Labs Unresulted Labs (From admission, onward)   Start     Ordered   08/15/17 2026  Protime-INR  Once,   STAT     08/15/17 2025   08/15/17 2023  Ammonia  STAT,   STAT     08/15/17 2022   08/15/17 1847  hCG, serum, qualitative  STAT,   STAT     08/15/17 1847   08/15/17 1806  Rapid urine drug screen (hospital performed)  Once,   STAT     08/15/17 1807      Vitals/Pain Today's Vitals   08/15/17 1700 08/15/17 1756 08/15/17 1915 08/15/17 2108  BP: 97/65   (!) 108/58  Pulse: 94   85  Resp: 16   (!) 26  Temp: 98.6 F (37 C)     TempSrc: Oral     SpO2: 94% 99%  100%  PainSc:   0-No pain     Isolation Precautions No active isolations  Medications Medications  LORazepam (ATIVAN) injection 0-4 mg (2 mg Intravenous Given 08/15/17 2136)    Or  LORazepam (ATIVAN) tablet 0-4 mg ( Oral See Alternative 08/15/17 2136)  LORazepam (ATIVAN) injection 0-4 mg (has no administration in time range)    Or  LORazepam (ATIVAN) tablet 0-4 mg (has no administration in time range)  thiamine (VITAMIN B-1) tablet 100 mg (100 mg Oral Not Given 08/15/17 1855)    Or  thiamine (B-1) injection 100 mg ( Intravenous See Alternative 08/15/17 1855)   ibuprofen (ADVIL,MOTRIN) tablet 600 mg (has  no administration in time range)  lactated ringers bolus 1,000 mL (has no administration in time range)  lactated ringers infusion (has no administration in time range)  potassium chloride 10 mEq in 100 mL IVPB (10 mEq Intravenous New Bag/Given 08/15/17 2143)  thiamine (B-1) injection 100 mg (100 mg Intramuscular Given 08/15/17 1851)    Mobility walks

## 2017-08-15 NOTE — ED Notes (Signed)
Dr. Donnald GarrePfeiffer in room to assess patient face to face.

## 2017-08-15 NOTE — ED Notes (Signed)
EDP at bedside  

## 2017-08-15 NOTE — H&P (Addendum)
History and Physical    Susan Mcconnell ZOX:[id number] DOB: 04/13/1975 DOA: 08/15/2017  PCP: Patient, No Pcp Per  Patient coming from: Home.  History obtained from ER physician as patient is sedated with Ativan and no family at the bedside.  Chief Complaint: Alcohol intoxication and suicidal thoughts.  HPI: Susan Mcconnell is a 42 y.o. female with history of alcoholism and cirrhosis of liver with previous history of tuberculosis was brought to the ER after family was concerned because of patient increased alcohol consumption.  At the same time patient also had endorsed some suicidal thoughts.  As per the effusion prior to arrival patient was sad and appeared tearful.  Did not complain of any chest pain or shortness of breath nausea vomiting or diarrhea.  ED Course: In the ER patient was given Ativan following which patient was sedated at the time of my exam patient was still sleeping.  Labs show hypernatremia and hypokalemia.  Hemoglobin appears to be at baseline.  LFTs are mildly elevated from her baseline.  Alcohol levels are markedly elevated.  Patient admitted for alcohol intoxication and suicidal thoughts.  Review of Systems: As per HPI, rest all negative.   Past Medical History:  Diagnosis Date  . Alcoholic hepatitis   . Pneumonia   . TB (tuberculosis) ~ 2017   "took the RX and was healed" (05/04/2017)    Past Surgical History:  Procedure Laterality Date  . OTHER SURGICAL HISTORY  2010   "birth control surgery; don't know what they did"     reports that she has never smoked. Her smokeless tobacco use includes chew. She reports that she drinks about 12.6 oz of alcohol per week. She reports that she does not use drugs.  No Known Allergies  Family History  Family history unknown: Yes    Prior to Admission medications   Not on File    Physical Exam: Vitals:   08/15/17 1756 08/15/17 2108 08/15/17 2211 08/15/17 2228  BP:  (!) 108/58 93/67 (!) 84/64  Pulse:  85  79  Resp:  (!)  26  (!) 21  Temp:    (!) 97.3 F (36.3 C)  TempSrc:    Oral  SpO2: 99% 100%  100%      Constitutional: Moderately built and poorly nourished. Vitals:   08/15/17 1756 08/15/17 2108 08/15/17 2211 08/15/17 2228  BP:  (!) 108/58 93/67 (!) 84/64  Pulse:  85  79  Resp:  (!) 26  (!) 21  Temp:    (!) 97.3 F (36.3 C)  TempSrc:    Oral  SpO2: 99% 100%  100%   Eyes: Anicteric mild pallor. ENMT: No discharge from the ears eyes nose or mouth. Neck: No mass felt.  No neck rigidity but no JVD appreciated. Respiratory: No rhonchi or crepitations. Cardiovascular: S1-S2 heard no murmurs appreciated. Abdomen: Soft nontender bowel sounds present. Musculoskeletal: No edema. Skin: No rash. Neurologic: Patient is sedated and no further neurological symptoms possible.  Pupils are reacting to light. Psychiatric: Sedated.  Was suicidal previously on admission.   Labs on Admission: I have personally reviewed following labs and imaging studies  CBC: Recent Labs  Lab 08/15/17 1806  WBC 5.8  HGB 10.6*  HCT 31.4*  MCV 96.0  PLT 226   Basic Metabolic Panel: Recent Labs  Lab 08/15/17 1806  NA 151*  K 2.9*  CL 120*  CO2 20*  GLUCOSE 99  BUN 6  CREATININE 0.75  CALCIUM 7.6*   GFR: CrCl cannot be  calculated (Unknown ideal weight.). Liver Function Tests: Recent Labs  Lab 08/15/17 1806  AST 245*  ALT 50*  ALKPHOS 225*  BILITOT 0.7  PROT 6.3*  ALBUMIN 2.8*   No results for input(s): LIPASE, AMYLASE in the last 168 hours. No results for input(s): AMMONIA in the last 168 hours. Coagulation Profile: Recent Labs  Lab 08/15/17 2123  INR 1.03   Cardiac Enzymes: No results for input(s): CKTOTAL, CKMB, CKMBINDEX, TROPONINI in the last 168 hours. BNP (last 3 results) No results for input(s): PROBNP in the last 8760 hours. HbA1C: No results for input(s): HGBA1C in the last 72 hours. CBG: No results for input(s): GLUCAP in the last 168 hours. Lipid Profile: No results for  input(s): CHOL, HDL, LDLCALC, TRIG, CHOLHDL, LDLDIRECT in the last 72 hours. Thyroid Function Tests: No results for input(s): TSH, T4TOTAL, FREET4, T3FREE, THYROIDAB in the last 72 hours. Anemia Panel: No results for input(s): VITAMINB12, FOLATE, FERRITIN, TIBC, IRON, RETICCTPCT in the last 72 hours. Urine analysis:    Component Value Date/Time   COLORURINE STRAW (A) 06/24/2017 0010   APPEARANCEUR CLEAR 06/24/2017 0010   LABSPEC 1.004 (L) 06/24/2017 0010   PHURINE 6.0 06/24/2017 0010   GLUCOSEU NEGATIVE 06/24/2017 0010   HGBUR NEGATIVE 06/24/2017 0010   BILIRUBINUR NEGATIVE 06/24/2017 0010   KETONESUR NEGATIVE 06/24/2017 0010   PROTEINUR NEGATIVE 06/24/2017 0010   NITRITE NEGATIVE 06/24/2017 0010   LEUKOCYTESUR NEGATIVE 06/24/2017 0010   Sepsis Labs: @LABRCNTIP (procalcitonin:4,lacticidven:4) )No results found for this or any previous visit (from the past 240 hour(s)).   Radiological Exams on Admission: No results found.   Assessment/Plan Principal Problem:   Suicidal ideation Active Problems:   Abnormal LFTs   Protein-calorie malnutrition, moderate (HCC)   Alcoholic intoxication without complication (HCC)   Hypotension due to hypovolemia   Hypernatremia   Alcohol intoxication (HCC)    1. Alcohol intoxication with impending possible withdrawal -we will closely observe and keep patient on PRN Ativan and CIWA protocol.  Will need social work consult.   2. Worsening LFTs with known history of alcoholism likely cause.  Check acute hepatitis panel.  Abdomen appears benign. 3. Suicidal thoughts will need suicide precaution and psychiatric consult once patient is alert awake. 4. Hypokalemia likely from poor oral intake and alcoholism.  Replace and recheck.  Check magnesium levels. 5. Hypernatremia -weekly from dehydration.  I have placed patient on half-normal saline.  Closely follow metabolic panel. 6. Cirrhosis of liver -ammonia level is mildly elevated will give lactulose per  rectum.  Recheck ammonia levels. 7. Anemia appears to be chronic.  Follow CBC.  UA is pending. On my exam patient was sedated after receiving IV Ativan.  Will need to get further history once patient is more alert awake and also when patient's family is able to be reached.   DVT prophylaxis: SCDs. Code Status: Full code. Family Communication: No family at the bedside. Disposition Plan: To be determined. Consults called: None. Admission status: Observation.   Eduard ClosArshad N Rhetta Cleek MD [facility] Pager [phone].  If 7PM-7AM, please contact night-coverage [email or web address] Password [email or web address]  08/15/2017, 11:03 PM

## 2017-08-15 NOTE — ED Notes (Signed)
Bed: [id number] Expected date:  Expected time:  Means of arrival:  Comments: [age] F SI/ ETOH

## 2017-08-15 NOTE — ED Notes (Signed)
Informed by Dr. Donnald GarrePfeiffer initiating IVC.

## 2017-08-15 NOTE — ED Triage Notes (Addendum)
[facility] Reports pt from home, family is concerned d/t pt has been drinking for days and has been making suicidal statements to family for weeks. Walle interpretor used for assessment, pt speaks Koreaepali. Pt appears irritable, drowsy, appears intoxicated  limited assessment d/t pt not willing to answer questions. "I'm not sick, nothing has happen to me." Pt does report she has been drinking alcohol today. Pt endorsing passive suicidal thoughts. Pt dressed out into burgundy scrubs and clothing locked in locker #26

## 2017-08-15 NOTE — ED Notes (Signed)
Report given to Natalie, RN

## 2017-08-16 ENCOUNTER — Other Ambulatory Visit: Payer: Self-pay

## 2017-08-16 DIAGNOSIS — E861 Hypovolemia: Secondary | ICD-10-CM | POA: Diagnosis present

## 2017-08-16 DIAGNOSIS — K7460 Unspecified cirrhosis of liver: Secondary | ICD-10-CM | POA: Diagnosis present

## 2017-08-16 DIAGNOSIS — Y908 Blood alcohol level of 240 mg/100 ml or more: Secondary | ICD-10-CM

## 2017-08-16 DIAGNOSIS — R945 Abnormal results of liver function studies: Secondary | ICD-10-CM | POA: Diagnosis present

## 2017-08-16 DIAGNOSIS — F1020 Alcohol dependence, uncomplicated: Secondary | ICD-10-CM

## 2017-08-16 DIAGNOSIS — Z8611 Personal history of tuberculosis: Secondary | ICD-10-CM | POA: Diagnosis not present

## 2017-08-16 DIAGNOSIS — Z23 Encounter for immunization: Secondary | ICD-10-CM | POA: Diagnosis not present

## 2017-08-16 DIAGNOSIS — R740 Nonspecific elevation of levels of transaminase and lactic acid dehydrogenase [LDH]: Secondary | ICD-10-CM | POA: Diagnosis present

## 2017-08-16 DIAGNOSIS — E876 Hypokalemia: Secondary | ICD-10-CM | POA: Diagnosis present

## 2017-08-16 DIAGNOSIS — E7220 Disorder of urea cycle metabolism, unspecified: Secondary | ICD-10-CM | POA: Diagnosis present

## 2017-08-16 DIAGNOSIS — E440 Moderate protein-calorie malnutrition: Secondary | ICD-10-CM | POA: Diagnosis present

## 2017-08-16 DIAGNOSIS — Z720 Tobacco use: Secondary | ICD-10-CM | POA: Diagnosis not present

## 2017-08-16 DIAGNOSIS — Z560 Unemployment, unspecified: Secondary | ICD-10-CM

## 2017-08-16 DIAGNOSIS — F10929 Alcohol use, unspecified with intoxication, unspecified: Secondary | ICD-10-CM

## 2017-08-16 DIAGNOSIS — R45851 Suicidal ideations: Secondary | ICD-10-CM

## 2017-08-16 DIAGNOSIS — I9589 Other hypotension: Secondary | ICD-10-CM | POA: Diagnosis present

## 2017-08-16 DIAGNOSIS — F10220 Alcohol dependence with intoxication, uncomplicated: Secondary | ICD-10-CM | POA: Diagnosis present

## 2017-08-16 DIAGNOSIS — D649 Anemia, unspecified: Secondary | ICD-10-CM | POA: Diagnosis present

## 2017-08-16 DIAGNOSIS — E860 Dehydration: Secondary | ICD-10-CM | POA: Diagnosis present

## 2017-08-16 DIAGNOSIS — E870 Hyperosmolality and hypernatremia: Secondary | ICD-10-CM | POA: Diagnosis present

## 2017-08-16 DIAGNOSIS — K7010 Alcoholic hepatitis without ascites: Secondary | ICD-10-CM | POA: Diagnosis present

## 2017-08-16 LAB — GLUCOSE, CAPILLARY
GLUCOSE-CAPILLARY: 103 mg/dL — AB (ref 70–99)
GLUCOSE-CAPILLARY: 105 mg/dL — AB (ref 70–99)
GLUCOSE-CAPILLARY: 65 mg/dL — AB (ref 70–99)
GLUCOSE-CAPILLARY: 80 mg/dL (ref 70–99)
Glucose-Capillary: 105 mg/dL — ABNORMAL HIGH (ref 70–99)
Glucose-Capillary: 113 mg/dL — ABNORMAL HIGH (ref 70–99)
Glucose-Capillary: 57 mg/dL — ABNORMAL LOW (ref 70–99)
Glucose-Capillary: 61 mg/dL — ABNORMAL LOW (ref 70–99)
Glucose-Capillary: 65 mg/dL — ABNORMAL LOW (ref 70–99)
Glucose-Capillary: 65 mg/dL — ABNORMAL LOW (ref 70–99)
Glucose-Capillary: 73 mg/dL (ref 70–99)
Glucose-Capillary: 81 mg/dL (ref 70–99)

## 2017-08-16 LAB — CBC WITH DIFFERENTIAL/PLATELET
BASOS ABS: 0.1 10*3/uL (ref 0.0–0.1)
Basophils Relative: 2 %
EOS PCT: 1 %
Eosinophils Absolute: 0 10*3/uL (ref 0.0–0.7)
HEMATOCRIT: 28 % — AB (ref 36.0–46.0)
HEMOGLOBIN: 9.3 g/dL — AB (ref 12.0–15.0)
LYMPHS ABS: 1.6 10*3/uL (ref 0.7–4.0)
LYMPHS PCT: 43 %
MCH: 32.4 pg (ref 26.0–34.0)
MCHC: 33.2 g/dL (ref 30.0–36.0)
MCV: 97.6 fL (ref 78.0–100.0)
Monocytes Absolute: 0.4 10*3/uL (ref 0.1–1.0)
Monocytes Relative: 11 %
NEUTROS ABS: 1.6 10*3/uL — AB (ref 1.7–7.7)
NEUTROS PCT: 43 %
PLATELETS: 192 10*3/uL (ref 150–400)
RBC: 2.87 MIL/uL — AB (ref 3.87–5.11)
RDW: 19.5 % — ABNORMAL HIGH (ref 11.5–15.5)
WBC: 3.7 10*3/uL — AB (ref 4.0–10.5)

## 2017-08-16 LAB — BASIC METABOLIC PANEL
ANION GAP: 8 (ref 5–15)
BUN: 6 mg/dL (ref 6–20)
CO2: 19 mmol/L — ABNORMAL LOW (ref 22–32)
CREATININE: 0.59 mg/dL (ref 0.44–1.00)
Calcium: 7.1 mg/dL — ABNORMAL LOW (ref 8.9–10.3)
Chloride: 121 mmol/L — ABNORMAL HIGH (ref 98–111)
GFR calc non Af Amer: >60 mL/min (ref >60)
Glucose, Bld: 67 mg/dL — ABNORMAL LOW (ref 70–99)
Potassium: 3.7 mmol/L (ref 3.5–5.1)
SODIUM: 148 mmol/L — AB (ref 135–145)

## 2017-08-16 LAB — URINALYSIS, ROUTINE W REFLEX MICROSCOPIC
Bilirubin Urine: NEGATIVE
Glucose, UA: NEGATIVE mg/dL
Hgb urine dipstick: NEGATIVE
Ketones, ur: NEGATIVE mg/dL
LEUKOCYTES UA: NEGATIVE
NITRITE: NEGATIVE
PH: 7 (ref 5.0–8.0)
Protein, ur: NEGATIVE mg/dL
SPECIFIC GRAVITY, URINE: 1.004 — AB (ref 1.005–1.030)

## 2017-08-16 LAB — HEPATIC FUNCTION PANEL
ALBUMIN: 2.5 g/dL — AB (ref 3.5–5.0)
ALT: 44 U/L (ref 0–44)
AST: 215 U/L — AB (ref 15–41)
Alkaline Phosphatase: 193 U/L — ABNORMAL HIGH (ref 38–126)
Bilirubin, Direct: 0.3 mg/dL — ABNORMAL HIGH (ref 0.0–0.2)
Indirect Bilirubin: 0.4 mg/dL (ref 0.3–0.9)
TOTAL PROTEIN: 5.6 g/dL — AB (ref 6.5–8.1)
Total Bilirubin: 0.7 mg/dL (ref 0.3–1.2)

## 2017-08-16 LAB — RAPID URINE DRUG SCREEN, HOSP PERFORMED
Amphetamines: NOT DETECTED
Benzodiazepines: NOT DETECTED
COCAINE: NOT DETECTED
Opiates: NOT DETECTED
TETRAHYDROCANNABINOL: NOT DETECTED

## 2017-08-16 LAB — MAGNESIUM: Magnesium: 1.8 mg/dL (ref 1.7–2.4)

## 2017-08-16 LAB — SODIUM, URINE, RANDOM: SODIUM UR: 61 mmol/L

## 2017-08-16 MED ORDER — DEXTROSE 50 % IV SOLN
1.0000 | INTRAVENOUS | Status: DC | PRN
  Administered 2017-08-16: 50 mL via INTRAVENOUS
  Filled 2017-08-16: qty 50

## 2017-08-16 MED ORDER — LACTULOSE ENEMA
300.0000 mL | Freq: Once | ORAL | Status: DC
  Filled 2017-08-16: qty 300

## 2017-08-16 MED ORDER — LACTULOSE 10 GM/15ML PO SOLN
30.0000 g | Freq: Two times a day (BID) | ORAL | Status: DC
  Administered 2017-08-16 – 2017-08-17 (×3): 30 g via ORAL
  Filled 2017-08-16 (×3): qty 45

## 2017-08-16 NOTE — [facility] Counselor (Signed)
Patient ED to inpatient per Glori BickersHouillion, Natalie, RN @ [phone]. Patient admitted to medical floor 1506-01.

## 2017-08-16 NOTE — Progress Notes (Signed)
RN updated MD of Psych MD clearing pt from suicide precautions and told RN to tell [facility] MD that IVC could be discontinued. RN updated [facility] MD and MD told RN to ask SW about the process and paperwork that needed to be filled out to rescind the IVC papers. MD discontinued the suicide sitter and IVC order in pt's chart. As RN [facility] to [facility] RN back. [facility] call back, she told RN to print out "notice of commitment change" paper and have MD sign. MD came up to unit to sign Notice of commitment paper at 1758.

## 2017-08-16 NOTE — Progress Notes (Addendum)
PROGRESS NOTE                                                                                                                                                                                                             Patient Demographics:    Susan Mcconnell, is a 42 y.o. female, DOB - 02-07-1975, ZOX:[id number]  Admit date - 08/15/2017   Admitting Physician Eduard Clos, MD  Outpatient Primary MD for the patient is Patient, No Pcp Per  LOS - 0   Chief Complaint  Patient presents with  . Alcohol Intoxication    SI       Brief Narrative    42 y.o. female with history of alcoholism and cirrhosis of liver with previous history of tuberculosis was brought to the ER after family was concerned because of patient increased alcohol consumption.  At the same time patient also had endorsed some suicidal thoughts.  As per the effusion prior to arrival patient was sad and appeared tearful.  Did not complain of any chest pain or shortness of breath nausea vomiting or diarrhea.  ED Course: In the ER patient was given Ativan following which patient was sedated at the time of my exam patient was still sleeping.  Labs show hypernatremia and hypokalemia.  Hemoglobin appears to be at baseline.  LFTs are mildly elevated from her baseline.  Alcohol levels are markedly elevated.  Patient admitted for alcohol intoxication and suicidal thoughts.      Subjective:    Emmylou Freshour today has, No headache, No chest pain, No abdominal pain - No Nausea,    Assessment  & Plan :    Principal Problem:   Suicidal ideation Active Problems:   Abnormal LFTs   Protein-calorie malnutrition, moderate (HCC)   Alcoholic intoxication without complication (HCC)   Hypotension due to hypovolemia   Hypernatremia   Alcohol intoxication (HCC)   Alcohol intoxication -Patient is very vague about how heavy she is drinking alcohol, she came with alcohol intoxication, continue with  CIWA protocol, thiamine and folic acid, far no evidence of withdrawals   Worsening LFTs -Secondary to alcohol use Abdomen appears benign.  Very typical for alcohol consumption pattern,  Hyperammonemia -Lactulose  Suicidal thoughts  -Psychiatry consulted  Addendum: I have discussed with psych, Dr. Sharma Covert, IVC can be discontinued, suicide sitter can be discontinued as  well,  Hypokalemia -Repleted    Hypernatremia -From dehydration, improving with half-normal saline  Cirrhosis of liver -ammonia level is mildly elevated will give lactulose.  Anemia appears to be chronic.   - Follow CBC.     Code Status : Full  Family Communication  : none at bedside  Disposition Plan  : pending further work up , and psych input  Consults  : Psychiatry  Procedures  : none  DVT Prophylaxis  :  SCD  Lab Results  Component Value Date   PLT 192 08/16/2017    Antibiotics  :    Anti-infectives (From admission, onward)   None        Objective:   Vitals:   08/16/17 0403 08/16/17 0408 08/16/17 1232 08/16/17 1509  BP:  (!) 87/61 100/71   Pulse:  79 92   Resp:  20    Temp:  97.7 F (36.5 C) 98.2 F (36.8 C)   TempSrc:  Oral Oral   SpO2:  98% 96% 99%  Weight: 39.1 kg (86 lb 3.2 oz)       Wt Readings from Last 3 Encounters:  08/16/17 39.1 kg (86 lb 3.2 oz)  06/24/17 48.3 kg (106 lb 7.7 oz)  06/12/17 49.9 kg (110 lb)     Intake/Output Summary (Last 24 hours) at 08/16/2017 1606 Last data filed at 08/16/2017 1400 Gross per 24 hour  Intake 868.33 ml  Output 3302 ml  Net -2433.67 ml     Physical Exam  Online Guernsey[place]alese translator has been used  Awake Alert, Oriented X 3, No new F.N deficits, thin appearing, frail Symmetrical Chest wall movement, Good air movement bilaterally, CTAB RRR,No Gallops,Rubs or new Murmurs, No Parasternal Heave +ve B.Sounds, Abd Soft, No tenderness, , No rebound - guarding or rigidity. No Cyanosis, Clubbing or edema, No new Rash or bruise        Data Review:    CBC Recent Labs  Lab 08/15/17 1806 08/16/17 0631  WBC 5.8 3.7*  HGB 10.6* 9.3*  HCT 31.4* 28.0*  PLT 226 192  MCV 96.0 97.6  MCH 32.4 32.4  MCHC 33.8 33.2  RDW 19.0* 19.5*  LYMPHSABS  --  1.6  MONOABS  --  0.4  EOSABS  --  0.0  BASOSABS  --  0.1    Chemistries  Recent Labs  Lab 08/15/17 1806 08/16/17 0631  NA 151* 148*  K 2.9* 3.7  CL 120* 121*  CO2 20* 19*  GLUCOSE 99 67*  BUN 6 6  CREATININE 0.75 0.59  CALCIUM 7.6* 7.1*  MG  --  1.8  AST 245* 215*  ALT 50* 44  ALKPHOS 225* 193*  BILITOT 0.7 0.7   ------------------------------------------------------------------------------------------------------------------ No results for input(s): CHOL, HDL, LDLCALC, TRIG, CHOLHDL, LDLDIRECT in the last 72 hours.  Lab Results  Component Value Date   HGBA1C 5.7 (H) 05/04/2017   ------------------------------------------------------------------------------------------------------------------ No results for input(s): TSH, T4TOTAL, T3FREE, THYROIDAB in the last 72 hours.  Invalid input(s): FREET3 ------------------------------------------------------------------------------------------------------------------ No results for input(s): VITAMINB12, FOLATE, FERRITIN, TIBC, IRON, RETICCTPCT in the last 72 hours.  Coagulation profile Recent Labs  Lab 08/15/17 2123  INR 1.03    No results for input(s): DDIMER in the last 72 hours.  Cardiac Enzymes No results for input(s): CKMB, TROPONINI, MYOGLOBIN in the last 168 hours.  Invalid input(s): CK ------------------------------------------------------------------------------------------------------------------ No results found for: BNP  Inpatient Medications  Scheduled Meds: . folic acid  1 mg Oral Daily  . lactulose  30 g Oral BID  .  multivitamin with minerals  1 tablet Oral Daily  . pneumococcal 23 valent vaccine  0.5 mL Intramuscular Tomorrow-1000  . thiamine  100 mg Oral Daily   Or  .  thiamine  100 mg Intravenous Daily   Continuous Infusions: . 0.45 % NaCl with KCl 20 mEq / L 75 mL/hr at 08/16/17 0949   PRN Meds:.dextrose, LORazepam **OR** LORazepam, ondansetron **OR** ondansetron (ZOFRAN) IV  Micro Results No results found for this or any previous visit (from the past 240 hour(s)).  Radiology Reports Dg Chest Port 1 View  Result Date: 08/15/2017 CLINICAL DATA:  42 y/o  F; acute encephalopathy. EXAM: PORTABLE CHEST 1 VIEW COMPARISON:  06/24/2017 chest radiograph. FINDINGS: Stable borderline cardiomegaly given projection and technique. Pulmonary vascular congestion. No consolidation, effusion, or pneumothorax. Bones are unremarkable. IMPRESSION: Stable borderline cardiomegaly. Pulmonary vascular congestion. No consolidation. Electronically Signed   By: Mitzi Hansen M.D.   On: 08/15/2017 23:41     Huey Bienenstock M.D on 08/16/2017 at 4:06 PM  Between 7am to 7pm - Pager - [phone]  After 7pm go to [email or web address] - password [email or web address]  [facility] -  Office  [phone]

## 2017-08-16 NOTE — Progress Notes (Signed)
CBG @ 0054 is 65- paged on-call MD and received order for D50 ampule PRN for low CBG

## 2017-08-16 NOTE — Consult Note (Addendum)
[facility] Face-to-Face Psychiatry Consult   Reason for Consult:  SI Referring Physician:  Dr. Waldron Labs  Patient Identification: Senai Ramnath MRN:  [id number] Principal Diagnosis: Alcohol use disorder, severe, dependence ([place]) Diagnosis:   Patient Active Problem List   Diagnosis Date Noted  . Hypernatremia [E87.0] 08/15/2017  . Suicidal ideation [R45.851] 08/15/2017  . Alcohol intoxication ([place]) [F10.929] 08/15/2017  . Sepsis due to pneumonia ([place]) [J18.9, A41.9] 06/24/2017  . Alcoholic intoxication without complication ([place]) [F62.130]   . HCAP (healthcare-associated pneumonia) [J18.9]   . Hypotension due to hypovolemia [I95.89, E86.1]   . Hepatitis [K75.9]   . Hypertriglyceridemia [E78.1] 05/06/2017  . Compensated metabolic acidosis [Q65.7] 05/06/2017  . Pancytopenia ([place]) [D61.818]   . Thrombocytopenia ([place]) [D69.6] 05/04/2017  . Hypokalemia [E87.6] 05/04/2017  . Abnormal LFTs [R94.5] 05/04/2017  . Chest pain [R07.9] 05/04/2017  . Protein-calorie malnutrition, moderate ([place]) [E44.0] 05/04/2017  . Elevated liver enzymes [R74.8]     Total Time spent with patient: 1 hour  Subjective:   Reanne Proby is a 42 y.o. female patient admitted with alcohol intoxication with impending possible withdrawal.  HPI:   Per chart review, patient was admitted with alcohol intoxication with impending possible withdrawal.  BAL is 535 on admission and UDS is negative. AST is 215. He has been endorsing SI per family.  Interpretation services were used to interview Ms. Beneke since her primary language is [place]. She reports that she does not remember saying that she was suicidal and if she made these statements it is because she was intoxicated and does not remember.  She denies current SI, HI or AVH.  She denies a history of suicide attempts.  She denies access to guns or weapons.  She denies problems with her mood or anxiety.  She denies a history of manic symptoms (decreased need for sleep, increased energy,  pressured speech or euphoria).  She denies problems with sleep or appetite.  She appears to minimize her alcohol use.  She does report a chronic history of alcohol use.  She reports drinking "two tenths" of alcohol a week.  She reports that she cannot complete substance abuse treatment at this time due to needing to care for her children.  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  She is able to safety plan.  She reports that she would be able to come to the hospital or call 911 if she were having thoughts to harm herself.  Patient's program coordinator of the [place] [phone]) contacted this notewriter by phone due to concerns for patient discharging home.  She reports that patient has a problem with alcohol use.  She becomes severely intoxicated and she cannot properly care for herself or her children.  She reports that she has tried to get the patient into treatment several times but since the patient cannot speak English this has been a barrier to treatment.  She was informed that the patient cannot be forced to receive substance abuse treatment and has to seek these services on her own although information will be provided to her.  She was understanding of this and had no further concerns or questions.  Past Psychiatric History: Alcohol abuse   Risk to Self:  None. Denies SI.  Risk to Others:  None. Denies HI.  Prior Inpatient Therapy:  Denies  Prior Outpatient Therapy:  Denies   Past  Medical History:  Past Medical History:  Diagnosis Date  . Alcoholic hepatitis   . Pneumonia   . TB (tuberculosis) ~ 2017   "took the RX and was healed" (05/04/2017)    Past Surgical History:  Procedure Laterality Date  . OTHER SURGICAL HISTORY  2010   "birth control surgery; don't know what they did"   Family History:  Family History  Family  history unknown: Yes   Family Psychiatric  History: Denies  Social History:  Social History   Substance and Sexual Activity  Alcohol Use Yes  . Alcohol/week: 12.6 oz  . Types: 21 Cans of beer per week   Comment: 05/04/2017 "1 can, 3 times/day"     Social History   Substance and Sexual Activity  Drug Use Never    Social History   Socioeconomic History  . Marital status: Divorced    Spouse name: Not on file  . Number of children: Not on file  . Years of education: Not on file  . Highest education level: Not on file  Occupational History  . Not on file  Social Needs  . Financial resource strain: Not on file  . Food insecurity:    Worry: Not on file    Inability: Not on file  . Transportation needs:    Medical: Not on file    Non-medical: Not on file  Tobacco Use  . Smoking status: Never Smoker  . Smokeless tobacco: Current User    Types: Chew  Substance and Sexual Activity  . Alcohol use: Yes    Alcohol/week: 12.6 oz    Types: 21 Cans of beer per week    Comment: 05/04/2017 "1 can, 3 times/day"  . Drug use: Never  . Sexual activity: Not Currently  Lifestyle  . Physical activity:    Days per week: Not on file    Minutes per session: Not on file  . Stress: Not on file  Relationships  . Social connections:    Talks on phone: Not on file    Gets together: Not on file    Attends religious service: Not on file    Active member of club or organization: Not on file    Attends meetings of clubs or organizations: Not on file    Relationship status: Not on file  Other Topics Concern  . Not on file  Social History Narrative  . Not on file   Additional Social History: She is from [place]. She moved to [place] 2 years ago to be near her sister. She lives at home with her 52 y/o daughter and 72 and 68 y/o sons. 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 She reports a chronic history of heavy alcohol use and relapsing 4 months ago after losing her job. She denies a history of withdrawals related to alcohol use. She denies illicit substance use.     Allergies:  No Known Allergies  Labs:  Results for orders placed or performed during the hospital encounter of 08/15/17 (from the past 48 hour(s))  Comprehensive metabolic panel     Status: Abnormal   Collection Time: 08/15/17  6:06 PM  Result Value Ref Range   Sodium 151 (H) 135 - 145 mmol/L   Potassium 2.9 (L) 3.5 - 5.1 mmol/L   Chloride 120 (H) 98 - 111 mmol/L    Comment: Please note change in reference range.  CO2 20 (L) 22 - 32 mmol/L   Glucose, Bld 99 70 - 99 mg/dL    Comment: Please note change in reference range.   BUN 6 6 - 20 mg/dL    Comment: Please note change in reference range.   Creatinine, Ser 0.75 0.44 - 1.00 mg/dL   Calcium 7.6 (L) 8.9 - 10.3 mg/dL   Total Protein 6.3 (L) 6.5 - 8.1 g/dL   Albumin 2.8 (L) 3.5 - 5.0 g/dL   AST 245 (H) 15 - 41 U/L   ALT 50 (H) 0 - 44 U/L    Comment: Please note change in reference range.   Alkaline Phosphatase 225 (H) 38 - 126 U/L   Total Bilirubin 0.7 0.3 - 1.2 mg/dL   GFR calc non Af Amer >60 >60 mL/min   GFR calc Af Amer >60 >60 mL/min    Comment: (NOTE) The eGFR has been calculated using the CKD EPI equation. This calculation has not been validated in all clinical situations. eGFR's persistently <60 mL/min signify possible Chronic Kidney Disease.    Anion gap 11 5 - 15    Comment: Performed at [facility], [place] [address] [place], [place] [address]  Ethanol     Status: Abnormal   Collection Time: 08/15/17  6:06 PM  Result Value Ref Range   Alcohol, Ethyl (B) 535 (HH) <10 mg/dL    Comment: CRITICAL RESULT CALLED TO, READ BACK BY AND VERIFIED WITH: Keene Breath [id number] @ [place] (NOTE) Lowest detectable limit for serum alcohol is 10 mg/dL. For medical purposes  only. Performed at [facility], [place] [address], [place], [place] [address]   cbc     Status: Abnormal   Collection Time: 08/15/17  6:06 PM  Result Value Ref Range   WBC 5.8 4.0 - 10.5 K/uL   RBC 3.27 (L) 3.87 - 5.11 MIL/uL   Hemoglobin 10.6 (L) 12.0 - 15.0 g/dL   HCT 31.4 (L) 36.0 - 46.0 %   MCV 96.0 78.0 - 100.0 fL   MCH 32.4 26.0 - 34.0 pg   MCHC 33.8 30.0 - 36.0 g/dL   RDW 19.0 (H) 11.5 - 15.5 %   Platelets 226 150 - 400 K/uL    Comment: Performed at [facility], [place] [address] [place], [place] [address]  Salicylate level     Status: None   Collection Time: 08/15/17  6:06 PM  Result Value Ref Range   Salicylate Lvl <0.0 2.8 - 30.0 mg/dL    Comment: Performed at [facility], [place] [address] [place], [place] [address]  Acetaminophen level     Status: Abnormal   Collection Time: 08/15/17  6:06 PM  Result Value Ref Range   Acetaminophen (Tylenol), Serum <10 (L) 10 - 30 ug/mL    Comment: (NOTE) Therapeutic concentrations vary significantly. A range of 10-30 ug/mL  may be an effective concentration for many patients. However, some  are best treated at concentrations outside of this range. Acetaminophen concentrations >150 ug/mL at 4 hours after ingestion  and >50 ug/mL at 12 hours after ingestion are often associated with  toxic reactions. Performed at [facility], [place] [address] [place], [place] [address]   hCG, serum, qualitative     Status: None   Collection Time: 08/15/17  9:23 PM  Result Value Ref Range   Preg, Serum NEGATIVE NEGATIVE    Comment:        THE SENSITIVITY OF  THIS METHODOLOGY IS >10 mIU/mL. Performed at [facility], [place] [address], [place], [place] [address]   Ammonia     Status: Abnormal   Collection Time: 08/15/17  9:23 PM  Result Value Ref Range   Ammonia 74 (H) 9 - 35 umol/L    Comment: Performed at [facility], [place] [address] [place], [place] [address]  Protime-INR     Status: None   Collection Time: 08/15/17  9:23 PM  Result Value Ref Range   Prothrombin Time 13.4 11.4 - 15.2 seconds   INR 1.03     Comment: Performed at [facility], [place] [address] [place], [place] [address]  Glucose, capillary     Status: Abnormal   Collection Time: 08/16/17 12:53 AM  Result Value Ref Range   Glucose-Capillary 65 (L) 70 - 99 mg/dL   Comment 1 Notify RN    Comment 2 Document in Chart   Glucose, capillary     Status: None   Collection Time: 08/16/17  2:08 AM  Result Value Ref Range   Glucose-Capillary 80 70 - 99 mg/dL  Glucose, capillary     Status: Abnormal   Collection Time: 08/16/17  3:59 AM  Result Value Ref Range   Glucose-Capillary 61 (L) 70 - 99 mg/dL   Comment 1 Notify RN    Comment 2 Document in Chart   Basic metabolic panel     Status: Abnormal   Collection Time: 08/16/17  6:31 AM  Result Value Ref Range   Sodium 148 (H) 135 - 145 mmol/L   Potassium 3.7 3.5 - 5.1 mmol/L    Comment: DELTA CHECK NOTED REPEATED TO VERIFY NO VISIBLE HEMOLYSIS    Chloride 121 (H) 98 - 111 mmol/L    Comment: Please note change in reference range.   CO2 19 (L) 22 - 32 mmol/L   Glucose, Bld 67 (L) 70 - 99 mg/dL    Comment: Please note change in reference range.   BUN 6 6 - 20 mg/dL    Comment: Please note change in reference range.   Creatinine, Ser 0.59 0.44 - 1.00 mg/dL   Calcium 7.1 (L) 8.9 - 10.3 mg/dL   GFR calc non Af Amer >60 >60 mL/min   GFR calc Af Amer >60 >60 mL/min    Comment: (NOTE) The eGFR has been calculated using the CKD EPI equation. This calculation has not been validated in all clinical situations. eGFR's persistently <60 mL/min signify possible Chronic Kidney Disease.    Anion gap 8 5 - 15    Comment: Performed at [facility], [place] [address] [place], [place] [address]  Hepatic function panel     Status: Abnormal   Collection Time: 08/16/17  6:31 AM  Result  Value Ref Range   Total Protein 5.6 (L) 6.5 - 8.1 g/dL   Albumin 2.5 (L) 3.5 - 5.0 g/dL   AST 215 (H) 15 - 41 U/L   ALT 44 0 - 44 U/L    Comment: Please note change in reference range.   Alkaline Phosphatase 193 (H) 38 - 126 U/L   Total Bilirubin 0.7 0.3 - 1.2 mg/dL   Bilirubin, Direct 0.3 (H) 0.0 - 0.2 mg/dL    Comment: Please note change in reference range.   Indirect Bilirubin 0.4 0.3 - 0.9 mg/dL    Comment: Performed at [facility], [place] [address] [place], [place] [address]  CBC WITH DIFFERENTIAL  Status: Abnormal   Collection Time: 08/16/17  6:31 AM  Result Value Ref Range   WBC 3.7 (L) 4.0 - 10.5 K/uL   RBC 2.87 (L) 3.87 - 5.11 MIL/uL   Hemoglobin 9.3 (L) 12.0 - 15.0 g/dL   HCT 28.0 (L) 36.0 - 46.0 %   MCV 97.6 78.0 - 100.0 fL   MCH 32.4 26.0 - 34.0 pg   MCHC 33.2 30.0 - 36.0 g/dL   RDW 19.5 (H) 11.5 - 15.5 %   Platelets 192 150 - 400 K/uL   Neutrophils Relative % 43 %   Neutro Abs 1.6 (L) 1.7 - 7.7 K/uL   Lymphocytes Relative 43 %   Lymphs Abs 1.6 0.7 - 4.0 K/uL   Monocytes Relative 11 %   Monocytes Absolute 0.4 0.1 - 1.0 K/uL   Eosinophils Relative 1 %   Eosinophils Absolute 0.0 0.0 - 0.7 K/uL   Basophils Relative 2 %   Basophils Absolute 0.1 0.0 - 0.1 K/uL    Comment: Performed at [facility], [place] [address] [place], [place] [address]  Magnesium     Status: None   Collection Time: 08/16/17  6:31 AM  Result Value Ref Range   Magnesium 1.8 1.7 - 2.4 mg/dL    Comment: Performed at [facility], [place] [address], [place], [place] [address]  Glucose, capillary     Status: Abnormal   Collection Time: 08/16/17  7:49 AM  Result Value Ref Range   Glucose-Capillary 65 (L) 70 - 99 mg/dL  Rapid urine drug screen (hospital performed)     Status: Abnormal   Collection Time: 08/16/17  8:00 AM  Result Value Ref Range   Opiates NONE DETECTED NONE DETECTED   Cocaine NONE DETECTED NONE DETECTED   Benzodiazepines NONE  DETECTED NONE DETECTED   Amphetamines NONE DETECTED NONE DETECTED   Tetrahydrocannabinol NONE DETECTED NONE DETECTED   Barbiturates (A) NONE DETECTED    Result not available. Reagent lot number recalled by manufacturer.    Comment: Performed at [facility], [place] [address], [place], [place] [address]  Urinalysis, Routine w reflex microscopic     Status: Abnormal   Collection Time: 08/16/17  8:00 AM  Result Value Ref Range   Color, Urine YELLOW YELLOW   APPearance CLEAR CLEAR   Specific Gravity, Urine 1.004 (L) 1.005 - 1.030   pH 7.0 5.0 - 8.0   Glucose, UA NEGATIVE NEGATIVE mg/dL   Hgb urine dipstick NEGATIVE NEGATIVE   Bilirubin Urine NEGATIVE NEGATIVE   Ketones, ur NEGATIVE NEGATIVE mg/dL   Protein, ur NEGATIVE NEGATIVE mg/dL   Nitrite NEGATIVE NEGATIVE   Leukocytes, UA NEGATIVE NEGATIVE    Comment: Performed at [place] [address], [place], [place] [address]  Sodium, urine, random     Status: None   Collection Time: 08/16/17  8:00 AM  Result Value Ref Range   Sodium, Ur 61 mmol/L    Comment: Performed at [facility], [place] [address] [place], [place] [address]  Glucose, capillary     Status: Abnormal   Collection Time: 08/16/17 10:18 AM  Result Value Ref Range   Glucose-Capillary 113 (H) 70 - 99 mg/dL  Glucose, capillary     Status: Abnormal   Collection Time: 08/16/17 12:18 PM  Result Value Ref Range   Glucose-Capillary 65 (L) 70 - 99 mg/dL  Glucose, capillary     Status: None   Collection Time: 08/16/17 12:58 PM  Result Value  Ref Range   Glucose-Capillary 73 70 - 99 mg/dL   Comment 1 Notify RN    Comment 2 Document in Chart     Current Facility-Administered Medications  Medication Dose Route Frequency Provider Last Rate Last Dose  . 0.45 % NaCl with KCl 20 mEq / L infusion   Intravenous Continuous Elgergawy, Silver Huguenin, MD 75 mL/hr at 08/16/17 0949    . dextrose 50 % solution 50 mL  1 ampule Intravenous  PRN Gardiner Barefoot, NP   50 mL at 08/16/17 0120  . folic acid (FOLVITE) tablet 1 mg  1 mg Oral Daily [name], MD   1 mg at 08/16/17 0948  . lactulose (CHRONULAC) 10 GM/15ML solution 30 g  30 g Oral BID Elgergawy, Silver Huguenin, MD   30 g at 08/16/17 1215  . LORazepam (ATIVAN) tablet 1 mg  1 mg Oral Q6H PRN [name], MD       Or  . LORazepam (ATIVAN) injection 1 mg  1 mg Intravenous Q6H PRN [name], MD      . multivitamin with minerals tablet 1 tablet  1 tablet Oral Daily [name], MD   1 tablet at 08/16/17 0949  . ondansetron (ZOFRAN) tablet 4 mg  4 mg Oral Q6H PRN [name], MD       Or  . ondansetron [facility]) injection 4 mg  4 mg Intravenous Q6H PRN [name], MD      . pneumococcal 23 valent vaccine (PNU-IMMUNE) injection 0.5 mL  0.5 mL Intramuscular Tomorrow-1000 [name], MD      . thiamine (VITAMIN B-1) tablet 100 mg  100 mg Oral Daily [name], MD       Or  . thiamine (B-1) injection 100 mg  100 mg Intravenous Daily [name], MD   100 mg at 08/16/17 9381    Musculoskeletal: Strength & Muscle Tone: within normal limits Gait & Station: UTA since patient was lying in bed. Patient leans: N/A  Psychiatric Specialty Exam: Physical Exam  Nursing note and vitals reviewed. Constitutional: She is oriented to person, place, and time. She appears well-developed and well-nourished.  HENT:  Head: Normocephalic and atraumatic.  Neck: Normal range of motion.  Respiratory: Effort normal.  Musculoskeletal: Normal range of motion.  Neurological: She is alert and oriented to person, place, and time.  Skin: No rash noted.  Psychiatric: She has a normal mood and affect. Her speech is normal and behavior is normal. Judgment and thought content normal. Cognition and memory are normal.    Review of Systems  Constitutional: Negative for chills and fever.  Cardiovascular: Negative for chest  pain.  Gastrointestinal: Positive for diarrhea. Negative for abdominal pain, constipation, nausea and vomiting.  Psychiatric/Behavioral: Positive for substance abuse. Negative for depression, hallucinations and suicidal ideas. The patient does not have insomnia.   All other systems reviewed and are negative.   Blood pressure 100/71, pulse 92, temperature 98.2 F (36.8 C), temperature source Oral, resp. rate 20, weight 39.1 kg (86 lb 3.2 oz), SpO2 96 %.Body mass index is 15.27 kg/m.  General Appearance: Fairly Groomed, middle aged, [name] woman wearing lipstick with long black who is lying in bed. NAD.   Eye Contact:  Good  Speech:  Clear and Coherent and Normal Rate  Volume:  Normal  Mood:  Euthymic  Affect:  Congruent and Full Range. She spontaneous smiles throughout interview and has a bright affect.   Thought  Process:  Goal Directed, Linear and Descriptions of Associations: Intact  Orientation:  Full (Time, Place, and Person)  Thought Content:  Logical  Suicidal Thoughts:  No  Homicidal Thoughts:  No  Memory:  Immediate;   Good Recent;   Good Remote;   Good  Judgement:  Poor  Insight:  Fair  Psychomotor Activity:  Normal  Concentration:  Concentration: Good and Attention Span: Good  Recall:  Good  Fund of Knowledge:  Good  Language:  Good  Akathisia:  No  Handed:  Right  AIMS (if indicated):   N/A  Assets:  Agricultural consultant Housing Social Support  ADL's:  Intact  Cognition:  WNL  Sleep:   Okay   Assessment:  Nzinga Ferran is a 42 y.o. female who was admitted to the hospital with alcohol intoxication. BAL was 535 on admission. Patient endorsed SI in the setting of intoxication. She denies remembering making these statements and denies current SI or a history of suicide attempts. She denies HI or AVH. She has problematic alcohol use that has impaired her daily functioning and ability to care for her children. SW has been notified to contact  CPS. She minimizes her alcohol use and is reluctant to accept resources although they will be provided at discharge in case she would like help in the future.   Treatment Plan Summary: -Please have SW provide patient with resources for financial assistance and alcohol abuse treatment. She is reluctant to pursue treatment at this time. She declines outpatient mental health services and denies mood symptoms.  -Patient is psychiatrically cleared. Psychiatry will sign off on patient at this time. Please consult psychiatry again as needed.   Disposition: No evidence of imminent risk to self or others at present.   Patient does not meet criteria for psychiatric inpatient admission.  Faythe Dingwall, DO 08/16/2017 1:11 PM

## 2017-08-17 DIAGNOSIS — F1020 Alcohol dependence, uncomplicated: Secondary | ICD-10-CM

## 2017-08-17 DIAGNOSIS — E870 Hyperosmolality and hypernatremia: Secondary | ICD-10-CM

## 2017-08-17 LAB — BASIC METABOLIC PANEL
Anion gap: 8 (ref 5–15)
BUN: <5 mg/dL — ABNORMAL LOW (ref 6–20)
CHLORIDE: 112 mmol/L — AB (ref 98–111)
CO2: 20 mmol/L — AB (ref 22–32)
CREATININE: 0.56 mg/dL (ref 0.44–1.00)
Calcium: 8.3 mg/dL — ABNORMAL LOW (ref 8.9–10.3)
GFR calc Af Amer: >60 mL/min (ref >60)
GFR calc non Af Amer: >60 mL/min (ref >60)
Glucose, Bld: 94 mg/dL (ref 70–99)
Potassium: 4.7 mmol/L (ref 3.5–5.1)
Sodium: 140 mmol/L (ref 135–145)

## 2017-08-17 LAB — GLUCOSE, CAPILLARY
GLUCOSE-CAPILLARY: 98 mg/dL (ref 70–99)
Glucose-Capillary: 93 mg/dL (ref 70–99)
Glucose-Capillary: 97 mg/dL (ref 70–99)

## 2017-08-17 LAB — AMMONIA: Ammonia: 46 umol/L — ABNORMAL HIGH (ref 9–35)

## 2017-08-17 MED ORDER — LACTULOSE 10 GM/15ML PO SOLN: 30.0000 g | Freq: Two times a day (BID) | ORAL | 0 refills | Status: DC

## 2017-08-17 MED ORDER — THIAMINE HCL 100 MG PO TABS: 100.0000 mg | ORAL_TABLET | Freq: Every day | ORAL | 0 refills | Status: AC

## 2017-08-17 MED ORDER — FOLIC ACID 1 MG PO TABS: 1.0000 mg | ORAL_TABLET | Freq: Every day | ORAL | 0 refills | Status: DC

## 2017-08-17 NOTE — Progress Notes (Signed)
LCSW spoke with Kelsey White, [facility] Coordinator at [facility].   Kelsey informed LCSW, that patient lives in a community close to their community center and is actively involved with their program. According to [place], patient has not been compliant and motivation has been low to make changes. The program has also faced the challenge of connecting patient to SA resources due to language barrier.   Adelina MingsKelsey stated she would reach out to the congregational nurse and community center to come up with a plan to support patient.   Beulah GandyBernette Meiling Hendriks, LSCW [facility] CSW [phone]

## 2017-08-17 NOTE — Discharge Summary (Signed)
Susan Mcconnell, is a 42 y.o. female  DOB 01-20-1976  MRN [id number].  Admission date:  08/15/2017  Admitting Physician  Eduard Clos, MD  Discharge Date:  08/17/2017   Primary MD  Patient, No Pcp Per  Recommendations for primary care physician for things to follow:  -Check CBC, BMP during next visit, continue counseling and support for alcohol abstinence   Admission Diagnosis  Suicidal ideation [R45.851] Alcohol abuse [F10.10] Hypernatremia [E87.0] Transaminitis [R74.0] Acute alcoholic intoxication with complication (HCC) [F10.929] Combative behavior [R46.89]   Discharge Diagnosis  Suicidal ideation [R45.851] Alcohol abuse [F10.10] Hypernatremia [E87.0] Transaminitis [R74.0] Acute alcoholic intoxication with complication (HCC) [F10.929] Combative behavior [R46.89]    Principal Problem:   Alcohol use disorder, severe, dependence (HCC) Active Problems:   Abnormal LFTs   Protein-calorie malnutrition, moderate (HCC)   Alcoholic intoxication without complication (HCC)   Hypotension due to hypovolemia   Hypernatremia   Suicidal ideation   Alcohol intoxication (HCC)      Past Medical History:  Diagnosis Date  . Alcoholic hepatitis   . Pneumonia   . TB (tuberculosis) ~ 2017   "took the RX and was healed" (05/04/2017)    Past Surgical History:  Procedure Laterality Date  . OTHER SURGICAL HISTORY  2010   "birth control surgery; don't know what they did"       History of present illness and  Hospital Course:     Kindly see H&P for history of present illness and admission details, please review complete Labs, Consult reports and Test reports for all details in brief  HPI  from the history and physical done on the day of admission 08/15/2017   HPI: Susan Mcconnell is a 42 y.o. female with history of alcoholism and cirrhosis of liver with previous history of tuberculosis was brought to the  ER after family was concerned because of patient increased alcohol consumption.  At the same time patient also had endorsed some suicidal thoughts.  As per the effusion prior to arrival patient was sad and appeared tearful.  Did not complain of any chest pain or shortness of breath nausea vomiting or diarrhea.  ED Course: In the ER patient was given Ativan following which patient was sedated at the time of my exam patient was still sleeping.  Labs show hypernatremia and hypokalemia.  Hemoglobin appears to be at baseline.  LFTs are mildly elevated from her baseline.  Alcohol levels are markedly elevated.  Patient admitted for alcohol intoxication and suicidal thoughts    Hospital Course   Alcohol intoxication -Patient is very vague about how heavy she is drinking alcohol, she came with alcohol intoxication, but apparently she has heavy alcohol consumption as she already has some evidence of liver disease from alcohol, she was kept on CIWA protocol during hospital stay, thiamine and folic acid, no evidence of withdrawal at time of discharge,  -Child psychotherapist, and given resources in her community  Worsening LFTs -Secondary to alcohol useAbdomen appears benign.  Very typical for alcohol consumption pattern as mainly elevated  AST, he was counseled via interpreter  Hyperammonemia -Start on lactulose 30 g twice daily  Suicidal thoughts  -Psychiatry consulted , IVC has been discontinued, suicide precaution discontinued   Hypokalemia -Repleted    Hypernatremia -From dehydration, resolved with half-normal saline  Cirrhosis of liver -ammonia level is mildly elevated will give lactulose.  Anemia appears to be chronic.     Discharge Condition:  Stable      Discharge Instructions  and  Discharge Medications     Discharge Instructions    Discharge instructions   Complete by:  As directed    Follow with Primary MD   Get CBC, CMP,   Activity: As tolerated with Full fall  precautions use walker/cane & assistance as needed   Disposition Home    Diet: regular diet   On your next visit with your primary care physician please Get Medicines reviewed and adjusted.   Please request your Prim.MD to go over all Hospital Tests and Procedure/Radiological results at the follow up, please get all Hospital records sent to your Prim MD by signing hospital release before you go home.   If you experience worsening of your admission symptoms, develop shortness of breath, life threatening emergency, suicidal or homicidal thoughts you must seek medical attention immediately by calling 911 or calling your MD immediately  if symptoms less severe.  You Must read complete instructions/literature along with all the possible adverse reactions/side effects for all the Medicines you take and that have been prescribed to you. Take any new Medicines after you have completely understood and accpet all the possible adverse reactions/side effects.   Do not drive, operating heavy machinery, perform activities at heights, swimming or participation in water activities or provide baby sitting services if your were admitted for syncope or siezures until you have seen by Primary MD or a Neurologist and advised to do so again.  Do not drive when taking Pain medications.    Do not take more than prescribed Pain, Sleep and Anxiety Medications  Special Instructions: If you have smoked or chewed Tobacco  in the last 2 yrs please stop smoking, stop any regular Alcohol  and or any Recreational drug use.  Wear Seat belts while driving.   Please note  You were cared for by a hospitalist during your hospital stay. If you have any questions about your discharge medications or the care you received while you were in the hospital after you are discharged, you can call the unit and asked to speak with the hospitalist on call if the hospitalist that took care of you is not available. Once you are  discharged, your primary care physician will handle any further medical issues. Please note that NO REFILLS for any discharge medications will be authorized once you are discharged, as it is imperative that you return to your primary care physician (or establish a relationship with a primary care physician if you do not have one) for your aftercare needs so that they can reassess your need for medications and monitor your lab values.   Increase activity slowly   Complete by:  As directed      Allergies as of 08/17/2017   No Known Allergies     Medication List    TAKE these medications   folic acid 1 MG tablet Commonly known as:  FOLVITE Take 1 tablet (1 mg total) by mouth daily. Start taking on:  08/18/2017   lactulose 10 GM/15ML solution Commonly known as:  CHRONULAC Take 45 mLs (30  g total) by mouth 2 (two) times daily.   thiamine 100 MG tablet Take 1 tablet (100 mg total) by mouth daily. Start taking on:  08/18/2017         Diet and Activity recommendation: See Discharge Instructions above   Consults obtained -  Psychiatry   Major procedures and Radiology Reports - PLEASE review detailed and final reports for all details, in brief -    Dg Chest Port 1 View  Result Date: 08/15/2017 CLINICAL DATA:  43 y/o  F; acute encephalopathy. EXAM: PORTABLE CHEST 1 VIEW COMPARISON:  06/24/2017 chest radiograph. FINDINGS: Stable borderline cardiomegaly given projection and technique. Pulmonary vascular congestion. No consolidation, effusion, or pneumothorax. Bones are unremarkable. IMPRESSION: Stable borderline cardiomegaly. Pulmonary vascular congestion. No consolidation. Electronically Signed   By: Mitzi Hansen M.D.   On: 08/15/2017 23:41    Micro Results    No results found for this or any previous visit (from the past 240 hour(s)).     Today   Subjective:   Susan Mcconnell today has no headache,no chest or abdominal pain,no new weakness tingling or  numbness.  Objective:   Blood pressure 105/71, pulse 85, temperature 98.4 F (36.9 C), temperature source Oral, resp. rate 18, weight 41.4 kg (91 lb 4.3 oz), SpO2 100 %.   Intake/Output Summary (Last 24 hours) at 08/17/2017 1305 Last data filed at 08/17/2017 1146 Gross per 24 hour  Intake 1073.75 ml  Output 3252 ml  Net -2178.25 ml    Exam Awake Alert, Oriented x 3, small build, thin appearing ,no new F.N deficits, Normal affect Symmetrical Chest wall movement, Good air movement bilaterally, CTAB RRR,No Gallops,Rubs or new Murmurs, No Parasternal Heave +ve B.Sounds, Abd Soft, Non tender,  No rebound -guarding or rigidity. No Cyanosis, Clubbing or edema, No new Rash or bruise  Data Review   CBC w Diff:  Lab Results  Component Value Date   WBC 3.7 (L) 08/16/2017   HGB 9.3 (L) 08/16/2017   HCT 28.0 (L) 08/16/2017   HCT 27.5 (L) 06/24/2017   PLT 192 08/16/2017   LYMPHOPCT 43 08/16/2017   MONOPCT 11 08/16/2017   EOSPCT 1 08/16/2017   BASOPCT 2 08/16/2017    CMP:  Lab Results  Component Value Date   NA 140 08/17/2017   K 4.7 08/17/2017   CL 112 (H) 08/17/2017   CO2 20 (L) 08/17/2017   BUN <5 (L) 08/17/2017   CREATININE 0.56 08/17/2017   PROT 5.6 (L) 08/16/2017   ALBUMIN 2.5 (L) 08/16/2017   BILITOT 0.7 08/16/2017   ALKPHOS 193 (H) 08/16/2017   AST 215 (H) 08/16/2017   ALT 44 08/16/2017  .   Total Time in preparing paper work, data evaluation and todays exam - 35 minutes  Huey Bienenstock M.D on 08/17/2017 at 1:05 PM  [facility]   Office  [phone]

## 2017-08-17 NOTE — Progress Notes (Signed)
[employer] interpreter video used to give medications and pneumonia vaccination.  All questions answered.

## 2017-08-17 NOTE — Progress Notes (Addendum)
LCSW notified at dc to provide SA and community resources to patient.   Patient admitted for Alcohol use disorder.   LCSW met at bedside with patient. Patients minor son and friend are present. [name] interpreter was used to translate.   Patient reports that she has been drinking more because she has been unemployed and her adult son lost his job as well. Patient currently does not have electricity at home and asked for resources for utility bill.   LCSW provided patient with community resources for utility bills, Residential and outpatient SA resources. Patient is interested in [employer]. AA schedule provided to patient.  Patient gives LCSW permission to make referral to the [facility] for [facility].   LCSW signing off.   Carolin Coy [place]

## 2017-08-17 NOTE — Discharge Instructions (Signed)
Follow with Primary MD   Get CBC, CMP,   Activity: As tolerated with Full fall precautions use walker/cane & assistance as needed   Disposition Home    Diet: regular diet   On your next visit with your primary care physician please Get Medicines reviewed and adjusted.   Please request your Prim.MD to go over all Hospital Tests and Procedure/Radiological results at the follow up, please get all Hospital records sent to your Prim MD by signing hospital release before you go home.   If you experience worsening of your admission symptoms, develop shortness of breath, life threatening emergency, suicidal or homicidal thoughts you must seek medical attention immediately by calling 911 or calling your MD immediately  if symptoms less severe.  You Must read complete instructions/literature along with all the possible adverse reactions/side effects for all the Medicines you take and that have been prescribed to you. Take any new Medicines after you have completely understood and accpet all the possible adverse reactions/side effects.   Do not drive, operating heavy machinery, perform activities at heights, swimming or participation in water activities or provide baby sitting services if your were admitted for syncope or siezures until you have seen by Primary MD or a Neurologist and advised to do so again.  Do not drive when taking Pain medications.    Do not take more than prescribed Pain, Sleep and Anxiety Medications  Special Instructions: If you have smoked or chewed Tobacco  in the last 2 yrs please stop smoking, stop any regular Alcohol  and or any Recreational drug use.  Wear Seat belts while driving.   Please note  You were cared for by a hospitalist during your hospital stay. If you have any questions about your discharge medications or the care you received while you were in the hospital after you are discharged, you can call the unit and asked to speak with the hospitalist on  call if the hospitalist that took care of you is not available. Once you are discharged, your primary care physician will handle any further medical issues. Please note that NO REFILLS for any discharge medications will be authorized once you are discharged, as it is imperative that you return to your primary care physician (or establish a relationship with a primary care physician if you do not have one) for your aftercare needs so that they can reassess your need for medications and monitor your lab values.

## 2017-08-17 NOTE — Progress Notes (Signed)
Discussed discharge instructions and medications with patient via [employer] interpreter video.  AVS given to patient.  Patient stated she has no job or money.  Bjorn Loserhonda, CM notified for medication needs.  All questions answered.

## 2018-02-03 ENCOUNTER — Emergency Department (HOSPITAL_COMMUNITY)
Admission: EM | Admit: 2018-02-03 | Discharge: 2018-02-04 | Disposition: A | Discharge status: Home / Self Care | Payer: Medicaid Other | Attending: Emergency Medicine | Admitting: Emergency Medicine

## 2018-02-03 ENCOUNTER — Emergency Department (HOSPITAL_COMMUNITY): Payer: Medicaid Other

## 2018-02-03 ENCOUNTER — Encounter (HOSPITAL_COMMUNITY): Payer: Self-pay | Admitting: Nurse Practitioner

## 2018-02-03 DIAGNOSIS — R740 Nonspecific elevation of levels of transaminase and lactic acid dehydrogenase [LDH]: Secondary | ICD-10-CM | POA: Insufficient documentation

## 2018-02-03 DIAGNOSIS — R7401 Elevation of levels of liver transaminase levels: Secondary | ICD-10-CM

## 2018-02-03 DIAGNOSIS — X58XXXA Exposure to other specified factors, initial encounter: Secondary | ICD-10-CM | POA: Insufficient documentation

## 2018-02-03 DIAGNOSIS — Z79899 Other long term (current) drug therapy: Secondary | ICD-10-CM | POA: Insufficient documentation

## 2018-02-03 DIAGNOSIS — S81812A Laceration without foreign body, left lower leg, initial encounter: Secondary | ICD-10-CM | POA: Insufficient documentation

## 2018-02-03 DIAGNOSIS — Y999 Unspecified external cause status: Secondary | ICD-10-CM | POA: Diagnosis not present

## 2018-02-03 DIAGNOSIS — F17220 Nicotine dependence, chewing tobacco, uncomplicated: Secondary | ICD-10-CM | POA: Diagnosis not present

## 2018-02-03 DIAGNOSIS — Y939 Activity, unspecified: Secondary | ICD-10-CM | POA: Insufficient documentation

## 2018-02-03 DIAGNOSIS — F10920 Alcohol use, unspecified with intoxication, uncomplicated: Secondary | ICD-10-CM | POA: Insufficient documentation

## 2018-02-03 DIAGNOSIS — Y908 Blood alcohol level of 240 mg/100 ml or more: Secondary | ICD-10-CM | POA: Diagnosis not present

## 2018-02-03 DIAGNOSIS — Y929 Unspecified place or not applicable: Secondary | ICD-10-CM | POA: Diagnosis not present

## 2018-02-03 DIAGNOSIS — D649 Anemia, unspecified: Secondary | ICD-10-CM

## 2018-02-03 DIAGNOSIS — S8992XA Unspecified injury of left lower leg, initial encounter: Secondary | ICD-10-CM | POA: Diagnosis present

## 2018-02-03 DIAGNOSIS — R410 Disorientation, unspecified: Secondary | ICD-10-CM | POA: Insufficient documentation

## 2018-02-03 LAB — CBC WITH DIFFERENTIAL/PLATELET
Abs Immature Granulocytes: 0 10*3/uL (ref 0.00–0.07)
Basophils Absolute: 0.1 10*3/uL (ref 0.0–0.1)
Basophils Relative: 2 %
Eosinophils Absolute: 0.1 10*3/uL (ref 0.0–0.5)
Eosinophils Relative: 1 %
HCT: 35.2 % — ABNORMAL LOW (ref 36.0–46.0)
Hemoglobin: 11.3 g/dL — ABNORMAL LOW (ref 12.0–15.0)
Immature Granulocytes: 0 %
Lymphocytes Relative: 56 %
Lymphs Abs: 2.8 10*3/uL (ref 0.7–4.0)
MCH: 31.4 pg (ref 26.0–34.0)
MCHC: 32.1 g/dL (ref 30.0–36.0)
MCV: 97.8 fL (ref 80.0–100.0)
Monocytes Absolute: 0.7 10*3/uL (ref 0.1–1.0)
Monocytes Relative: 14 %
Neutro Abs: 1.4 10*3/uL — ABNORMAL LOW (ref 1.7–7.7)
Neutrophils Relative %: 27 %
Platelets: 296 10*3/uL (ref 150–400)
RBC: 3.6 MIL/uL — ABNORMAL LOW (ref 3.87–5.11)
RDW: 21.2 % — ABNORMAL HIGH (ref 11.5–15.5)
WBC: 5.1 10*3/uL (ref 4.0–10.5)
nRBC: 0 % (ref 0.0–0.2)

## 2018-02-03 LAB — COMPREHENSIVE METABOLIC PANEL
ALT: 107 U/L — ABNORMAL HIGH (ref 0–44)
ANION GAP: 11 (ref 5–15)
AST: 388 U/L — ABNORMAL HIGH (ref 15–41)
Albumin: 3.8 g/dL (ref 3.5–5.0)
Alkaline Phosphatase: 276 U/L — ABNORMAL HIGH (ref 38–126)
BUN: 5 mg/dL — ABNORMAL LOW (ref 6–20)
CO2: 19 mmol/L — ABNORMAL LOW (ref 22–32)
Calcium: 8.2 mg/dL — ABNORMAL LOW (ref 8.9–10.3)
Chloride: 114 mmol/L — ABNORMAL HIGH (ref 98–111)
Creatinine, Ser: 0.57 mg/dL (ref 0.44–1.00)
GFR calc Af Amer: >60 mL/min (ref >60)
GLUCOSE: 88 mg/dL (ref 70–99)
Potassium: 4.2 mmol/L (ref 3.5–5.1)
Sodium: 144 mmol/L (ref 135–145)
Total Bilirubin: 0.4 mg/dL (ref 0.3–1.2)
Total Protein: 8.2 g/dL — ABNORMAL HIGH (ref 6.5–8.1)

## 2018-02-03 LAB — I-STAT BETA HCG BLOOD, ED (MC, WL, AP ONLY): I-stat hCG, quantitative: <5 m[IU]/mL (ref <5)

## 2018-02-03 LAB — ETHANOL: Alcohol, Ethyl (B): 382 mg/dL (ref <10)

## 2018-02-03 LAB — SALICYLATE LEVEL: Salicylate Lvl: <7 mg/dL (ref 2.8–30.0)

## 2018-02-03 LAB — ACETAMINOPHEN LEVEL: Acetaminophen (Tylenol), Serum: <10 ug/mL — ABNORMAL LOW (ref 10–30)

## 2018-02-03 MED ORDER — LORAZEPAM 2 MG/ML IJ SOLN
1.0000 mg | Freq: Once | INTRAMUSCULAR | Status: AC
  Administered 2018-02-03: 1 mg via INTRAVENOUS
  Filled 2018-02-03: qty 1

## 2018-02-03 MED ORDER — THIAMINE HCL 100 MG/ML IJ SOLN
Freq: Once | INTRAVENOUS | Status: AC
  Administered 2018-02-03: 21:00:00 via INTRAVENOUS
  Filled 2018-02-03: qty 1000

## 2018-02-03 NOTE — ED Provider Notes (Signed)
Patient signed out to me at shift change.  Intoxicated with alcohol.  Reported SI.  Shin laceration.  Will need to sober up and be reassessed before medical/psychiatric clearance is complete.  4:56 AM Patient is alert and oriented. Drinking water.  Asking to go home.   Roxy Horseman, PA-C 02/04/18 0457    Charlynne Pander, MD 02/06/18 [phone]

## 2018-02-03 NOTE — ED Triage Notes (Signed)
Pt presents from home, family called medics to report a left shin laceration that resulted from "hitting the couch." Also reported she drank vodka all day and is seemingly intoxicated.

## 2018-02-03 NOTE — ED Provider Notes (Signed)
[facility] DEPT Provider Note   CSN: [id number] Arrival date & time: 02/03/18  1940     History   Chief Complaint Chief Complaint  Patient presents with  . Laceration    Left Shin  . Alcohol Intoxication    HPI Susan Mcconnell is a 43 y.o. [name] speaking female with a PMHx of alcoholism and TB s/p treatment, who presents to the ED via EMS with complaints of L shin laceration and EtOH intoxication. LEVEL 5 CAVEAT DUE TO INTOXICATION, pt uncooperative with evaluation and therefore this limits history significantly.  Patient states that she cut herself "earlier" but will not tell me how or exactly when this happened.  While using the interpreter, she tells them "I just want to die" and "I have a lot of stress".  When asked how much she drinks, she states "only one beer".  She later tells the interpreter that she "does not want to talk because she did not pay her last bill".  She is refusing to answer any other questions and therefore the remainder of history and ROS is significantly limited.  The history is provided by the patient and medical records. The history is limited by the condition of the patient and a language barrier. A language interpreter was used [employer]).  Laceration    Alcohol Intoxication     Past Medical History:  Diagnosis Date  . Alcoholic hepatitis   . Pneumonia   . TB (tuberculosis) ~ 2017   "took the RX and was healed" (05/04/2017)    Patient Active Problem List   Diagnosis Date Noted  . Alcohol use disorder, severe, dependence ([place])   . Hypernatremia 08/15/2017  . Suicidal ideation 08/15/2017  . Alcohol intoxication ([place]) 08/15/2017  . Sepsis due to pneumonia ([place]) 06/24/2017  . Alcoholic intoxication without complication ([place])   . HCAP (healthcare-associated pneumonia)   . Hypotension due to hypovolemia   . Hepatitis   . Hypertriglyceridemia 05/06/2017  . Compensated metabolic acidosis 82/95/6213  .  Pancytopenia ([place])   . Thrombocytopenia ([place]) 05/04/2017  . Hypokalemia 05/04/2017  . Abnormal LFTs 05/04/2017  . Chest pain 05/04/2017  . Protein-calorie malnutrition, moderate ([place]) 05/04/2017  . Elevated liver enzymes     Past Surgical History:  Procedure Laterality Date  . OTHER SURGICAL HISTORY  2010   "birth control surgery; don't know what they did"     OB History   No obstetric history on file.      Home Medications    Prior to Admission medications   Medication Sig Start Date End Date Taking? Authorizing Provider  folic acid (FOLVITE) 1 MG tablet Take 1 tablet (1 mg total) by mouth daily. 08/18/17   Elgergawy, Silver Huguenin, MD  lactulose (CHRONULAC) 10 GM/15ML solution Take 45 mLs (30 g total) by mouth 2 (two) times daily. 08/17/17   Elgergawy, Silver Huguenin, MD  thiamine 100 MG tablet Take 1 tablet (100 mg total) by mouth daily. 08/18/17   Elgergawy, Silver Huguenin, MD    Family History Family History  Family history unknown: Yes    Social History Social History   Tobacco Use  . Smoking status: Never Smoker  . Smokeless tobacco: Current User    Types: Chew  Substance Use Topics  . Alcohol use: Yes    Alcohol/week: 21.0 standard drinks    Types: 21 Cans of beer per week    Comment: 05/04/2017 "1 can, 3 times/day"  . Drug use: Never     Allergies  Patient has no known allergies.   Review of Systems Review of Systems  Unable to perform ROS: Other  Musculoskeletal: Positive for arthralgias.  Skin: Positive for wound.  Psychiatric/Behavioral: Positive for suicidal ideas.   LEVEL 5 CAVEAT DUE TO INTOXICATION  Physical Exam Updated Vital Signs BP 108/82 (BP Location: Left Arm)   Pulse 98   Temp 98 F (36.7 C) (Oral)   Resp 18   Ht '5\' 3"'$  (1.6 m)   Wt 45.4 kg   LMP  (LMP Unknown)   SpO2 100%   BMI 17.71 kg/m   Physical Exam Vitals signs and nursing note reviewed.  Constitutional:      General: She is not in acute distress.    Appearance: Normal  appearance. She is well-developed. She is not toxic-appearing.     Comments: Afebrile, nontoxic, NAD, smells of alcohol, uncooperative with exam. BP soft but similar to prior visits  HENT:     Head: Normocephalic and atraumatic.     Comments: No obvious trauma to the head Eyes:     General:        Right eye: No discharge.        Left eye: No discharge.     Conjunctiva/sclera: Conjunctivae normal.  Neck:     Musculoskeletal: Normal range of motion and neck supple. Normal range of motion. No neck rigidity, spinous process tenderness or muscular tenderness.     Comments: FROM intact, no spinous process TTP, no bony stepoffs or deformities.  Cardiovascular:     Rate and Rhythm: Normal rate and regular rhythm.     Pulses: Normal pulses.     Heart sounds: Normal heart sounds, S1 normal and S2 normal. No murmur. No friction rub. No gallop.   Pulmonary:     Effort: Pulmonary effort is normal. No respiratory distress.     Breath sounds: Normal breath sounds. No decreased breath sounds, wheezing, rhonchi or rales.  Abdominal:     General: Bowel sounds are normal. There is no distension.     Palpations: Abdomen is soft. Abdomen is not rigid.     Tenderness: There is no abdominal tenderness. There is no right CVA tenderness, left CVA tenderness, guarding or rebound. Negative signs include Murphy's sign and McBurney's sign.  Musculoskeletal: Normal range of motion.     Left lower leg: She exhibits laceration.     Comments: MAE x4 L shin with V shaped laceration, no retained FBs, bleeding controlled, SEE PICTURE BELOW. Mild TTP around this area, no crepitus or deformity. No other areas of tenderness to remainder of extremities. No spinal tenderness.  Strength and sensation grossly intact, distal pulses intact, soft compartments.   Skin:    General: Skin is warm and dry.     Findings: Laceration present. No rash.     Comments: L shin lac as mentioned above and pictured below  Neurological:      Mental Status: She is alert.     Sensory: Sensation is intact. No sensory deficit.     Motor: Motor function is intact.     Comments: Uncooperative with exam, smells of alcohol, won't answer questions even with interpreter.   Psychiatric:        Mood and Affect: Mood and affect normal.        Behavior: Behavior is uncooperative.        ED Treatments / Results  Labs (all labs ordered are listed, but only abnormal results are displayed) Labs Reviewed  CBC WITH DIFFERENTIAL/PLATELET - Abnormal;  Notable for the following components:      Result Value   RBC 3.60 (*)    Hemoglobin 11.3 (*)    HCT 35.2 (*)    RDW 21.2 (*)    Neutro Abs 1.4 (*)    All other components within normal limits  COMPREHENSIVE METABOLIC PANEL - Abnormal; Notable for the following components:   Chloride 114 (*)    CO2 19 (*)    BUN 5 (*)    Calcium 8.2 (*)    Total Protein 8.2 (*)    AST 388 (*)    ALT 107 (*)    Alkaline Phosphatase 276 (*)    All other components within normal limits  ETHANOL - Abnormal; Notable for the following components:   Alcohol, Ethyl (B) 382 (*)    All other components within normal limits  ACETAMINOPHEN LEVEL - Abnormal; Notable for the following components:   Acetaminophen (Tylenol), Serum <10 (*)    All other components within normal limits  SALICYLATE LEVEL  RAPID URINE DRUG SCREEN, HOSP PERFORMED  I-STAT BETA HCG BLOOD, ED ([facility], [facility], [facility] ONLY)    EKG None  Radiology Dg Tibia/fibula Left  Result Date: 02/03/2018 CLINICAL DATA:  Left leg laceration. EXAM: LEFT TIBIA AND FIBULA - 2 VIEW COMPARISON:  None. FINDINGS: Cortical margins of the tibia and fibula are intact. No fracture. Ankle and knee alignment are maintained. A dressing overlies the lower leg. No tracking soft tissue air or radiopaque foreign body. IMPRESSION: No osseous abnormality or radiopaque foreign body. Electronically Signed   By: Keith Rake M.D.   On: 02/03/2018 20:41   Ct Head Wo  Contrast  Result Date: 02/03/2018 CLINICAL DATA:  43 y/o F; alcohol intoxication, confusion, combative. EXAM: CT HEAD WITHOUT CONTRAST TECHNIQUE: Contiguous axial images were obtained from the base of the skull through the vertex without intravenous contrast. COMPARISON:  None. FINDINGS: Brain: No evidence of acute infarction, hemorrhage, hydrocephalus, extra-axial collection or mass lesion/mass effect. Vascular: No hyperdense vessel or unexpected calcification. Skull: Normal. Negative for fracture or focal lesion. Sinuses/Orbits: No acute finding. Other: None. IMPRESSION: No acute intracranial abnormality identified. Unremarkable CT of the head. Electronically Signed   By: Kristine Garbe M.D.   On: 02/03/2018 20:52    Procedures .Marland KitchenLaceration Repair Date/Time: 02/03/2018 10:44 PM Performed by: Reece Agar, PA-C Authorized by: Reece Agar, [place]   Consent:    Consent obtained:  Emergent situation   Risks discussed:  Pain   Alternatives discussed:  No treatment Anesthesia (see MAR for exact dosages):    Anesthesia method:  None Laceration details:    Location:  Leg   Leg location:  L lower leg   Length (cm):  3 Repair type:    Repair type:  Simple Pre-procedure details:    Preparation:  Patient was prepped and draped in usual sterile fashion and imaging obtained to evaluate for foreign bodies Exploration:    Hemostasis achieved with:  Direct pressure   Wound exploration: wound explored through full range of motion and entire depth of wound probed and visualized     Wound extent: no foreign bodies/material noted and no underlying fracture noted     Contaminated: no   Treatment:    Area cleansed with:  Saline   Amount of cleaning:  Standard   Irrigation solution:  Sterile saline   Irrigation method:  Syringe Skin repair:    Repair method:  Staples   Number of staples:  4 Approximation:    Approximation:  Close Post-procedure details:    Dressing:  Bulky dressing    Patient tolerance of procedure:  Tolerated well, no immediate complications   (including critical care time)  Medications Ordered in ED Medications  sodium chloride 0.9 % 1,000 mL with thiamine 973 mg, folic acid 1 mg, multivitamins adult 10 mL infusion ( Intravenous New Bag/Given 02/03/18 2059)  LORazepam (ATIVAN) injection 1 mg (1 mg Intravenous Given 02/03/18 2135)     Initial Impression / Assessment and Plan / ED Course  I have reviewed the triage vital signs and the nursing notes.  Pertinent labs & imaging results that were available during my care of the patient were reviewed by me and considered in my medical decision making (see chart for details).     43 y.o. female here with left leg laceration and alcohol intoxication.  Level 5 caveat applies due to alcohol intoxication.  Even with an interpreter, patient does not want to answer any questions, appears very intoxicated.  She reports that she cut herself earlier, unclear how or when.  She has a small V-shaped laceration to the left shin, bleeding controlled.  Mild tenderness around this area.  She cannot tell us whether she hit her head or fell, she is very uncooperative with exam.  Extremities neurovascularly intact.  Smells of alcohol.  BP soft but similar to prior. Will get CT head and left tib-fib x-ray, get labs, and reassess.  She will need to sober up significantly before wound repair could be attempted. Will give banana bag of fluids.   10:46 PM CBC w/diff with chronic stable anemia. CMP with chronically elevated AST 388, ALT 107, Alk phos 276 likely from alcoholism; Cl 114, bicarb 19 also both chronic. EtOH level 382. BetaHCG neg. Salicylate and acetaminophen levels WNL. CT head negative for acute finding. L tib/fib xray negative for injury or retained FB. UDS not yet done.  Pt told that we would repair leg wound, she nodded her head and allowed for staples to be placed. Pt still very intoxicated, will have to sober up then be  reassessed, if still suicidal then TTS consultation would need to be done. Patient care to be resumed by Lorre Munroe, PA-C at shift change sign-out. Patient history has been discussed with provider resuming care. Please see their notes for further documentation of pending results and dispo/care. Pt stable at sign-out and updated on transfer of care.    Final Clinical Impressions(s) / ED Diagnoses   Final diagnoses:  Alcoholic intoxication without complication ([place])  Laceration of left lower extremity, initial encounter  Transaminitis  Chronic anemia    ED Discharge Orders    [address], [place], [place] 02/03/18 2259    Drenda Freeze, MD 02/03/18 [phone]

## 2018-02-03 NOTE — ED Notes (Signed)
Bed: [facility] Expected date:  Expected time:  Means of arrival:  Comments: 46F- left leg laceration/ETOH

## 2018-02-04 NOTE — ED Notes (Signed)
Ride for patient was attempted to be called again [phone]

## 2018-02-04 NOTE — ED Notes (Signed)
Patient currently does not have a ride home

## 2018-02-04 NOTE — ED Notes (Signed)
Pt ambulated with one assist to the bathroom. Pt had difficulty walking d/t pain in the LLE. W/C used to return to hall bed. Attempted to call for ride x3 with no answer

## 2018-02-11 ENCOUNTER — Encounter (HOSPITAL_COMMUNITY): Payer: Self-pay | Admitting: Emergency Medicine

## 2018-02-11 ENCOUNTER — Ambulatory Visit (HOSPITAL_COMMUNITY)
Admission: EM | Admit: 2018-02-11 | Discharge: 2018-02-11 | Disposition: A | Discharge status: Home / Self Care | Payer: Self-pay | Attending: Family Medicine | Admitting: Family Medicine

## 2018-02-11 ENCOUNTER — Other Ambulatory Visit: Payer: Self-pay

## 2018-02-11 DIAGNOSIS — L089 Local infection of the skin and subcutaneous tissue, unspecified: Secondary | ICD-10-CM | POA: Insufficient documentation

## 2018-02-11 DIAGNOSIS — S81802A Unspecified open wound, left lower leg, initial encounter: Secondary | ICD-10-CM | POA: Insufficient documentation

## 2018-02-11 DIAGNOSIS — T148XXA Other injury of unspecified body region, initial encounter: Secondary | ICD-10-CM | POA: Insufficient documentation

## 2018-02-11 MED ORDER — DOXYCYCLINE HYCLATE 100 MG PO TABS: 100.0000 mg | ORAL_TABLET | Freq: Two times a day (BID) | ORAL | 0 refills | Status: DC

## 2018-02-11 MED ORDER — MUPIROCIN 2 % EX OINT: 1.0000 "application " | TOPICAL_OINTMENT | Freq: Three times a day (TID) | CUTANEOUS | 1 refills | Status: DC

## 2018-02-11 NOTE — ED Triage Notes (Signed)
[name] interpreter used from [employer]. #[id number] Susan Mcconnell  Pt here for staple removal to her left shin.  The wound is red, swollen and has a white discharge.  The patient also states that she gets pain with standing or walking.

## 2018-02-11 NOTE — Discharge Instructions (Signed)
Wash with soap and water three times daily, followed by antibiotic cream and dressing.  Antibiotic tablet twice daily  Recheck in three days

## 2018-02-11 NOTE — ED Provider Notes (Addendum)
[facility]    CSN: [id number] Arrival date & time: 02/11/18  1436     History   Chief Complaint Chief Complaint  Patient presents with  . Suture / Staple Removal    Appt:  . Wound Check    HPI Susan Mcconnell is a 43 y.o. female.   This is a [place] lady, 43 years old, who had a laceration repair done on her lower anterior tibia in [place] 7 days ago.  This was repaired with staples that were not aligned appropriately.  The wound is gotten infected and is draining thick purulent pus.  She is having increasing pain and erythema in the area.     Past Medical History:  Diagnosis Date  . Alcoholic hepatitis   . Pneumonia   . TB (tuberculosis) ~ 2017   "took the RX and was healed" (05/04/2017)    Patient Active Problem List   Diagnosis Date Noted  . Alcohol use disorder, severe, dependence ([facility])   . Hypernatremia 08/15/2017  . Suicidal ideation 08/15/2017  . Alcohol intoxication ([facility]) 08/15/2017  . Sepsis due to pneumonia ([facility]) 06/24/2017  . Alcoholic intoxication without complication ([facility])   . HCAP (healthcare-associated pneumonia)   . Hypotension due to hypovolemia   . Hepatitis   . Hypertriglyceridemia 05/06/2017  . Compensated metabolic acidosis 05/06/2017  . Pancytopenia ([facility])   . Thrombocytopenia ([facility]) 05/04/2017  . Hypokalemia 05/04/2017  . Abnormal LFTs 05/04/2017  . Chest pain 05/04/2017  . Protein-calorie malnutrition, moderate ([facility]) 05/04/2017  . Elevated liver enzymes     Past Surgical History:  Procedure Laterality Date  . OTHER SURGICAL HISTORY  2010   "birth control surgery; don't know what they did"    OB History   No obstetric history on file.      Home Medications    Prior to Admission medications   Medication Sig Start Date End Date Taking? Authorizing Provider  doxycycline (VIBRA-TABS) 100 MG tablet Take 1 tablet (100 mg total) by mouth 2 (two) times daily. 02/11/18   Elvina Sidle, MD  folic acid (FOLVITE) 1 MG  tablet Take 1 tablet (1 mg total) by mouth daily. 08/18/17   Elgergawy, Leana Roe, MD  lactulose (CHRONULAC) 10 GM/15ML solution Take 45 mLs (30 g total) by mouth 2 (two) times daily. 08/17/17   Elgergawy, Leana Roe, MD  mupirocin ointment (BACTROBAN) 2 % Apply 1 application topically 3 (three) times daily. 02/11/18   Elvina Sidle, MD  thiamine 100 MG tablet Take 1 tablet (100 mg total) by mouth daily. 08/18/17   Elgergawy, Leana Roe, MD    Family History Family History  Family history unknown: Yes    Social History Social History   Tobacco Use  . Smoking status: Never Smoker  . Smokeless tobacco: Current User    Types: Chew  Substance Use Topics  . Alcohol use: Yes    Alcohol/week: 21.0 standard drinks    Types: 21 Cans of beer per week    Comment: 05/04/2017 "1 can, 3 times/day"  . Drug use: Never     Allergies   Patient has no known allergies.   Review of Systems Review of Systems   Physical Exam Triage Vital Signs ED Triage Vitals  Enc Vitals Group     BP 02/11/18 1510 133/85     Pulse Rate 02/11/18 1510 88     Resp --      Temp 02/11/18 1510 98.7 F (37.1 C)     Temp Source 02/11/18  1510 Oral     SpO2 02/11/18 1510 100 %     Weight --      Height --      Head Circumference --      Peak Flow --      Pain Score 02/11/18 1506 2     Pain Loc --      Pain Edu? --      Excl. in GC? --    No data found.  Updated Vital Signs BP 133/85 (BP Location: Right Arm)   Pulse 88   Temp 98.7 F (37.1 C) (Oral)   LMP 02/06/2018 (Approximate)   SpO2 100%    Physical Exam Vitals signs and nursing note reviewed.  Constitutional:      Appearance: Normal appearance.  HENT:     Head: Normocephalic.     Mouth/Throat:     Mouth: Mucous membranes are moist.  Eyes:     Conjunctiva/sclera: Conjunctivae normal.  Pulmonary:     Effort: Pulmonary effort is normal.  Skin:    General: Skin is warm.     Comments: 1-1/2 x 1-1/2 square laceration with thick pus on the  anterior lower one third of the left tibia area.  The staples were removed.  Dressing was applied  Neurological:     Mental Status: She is alert.   Your staples removed which were aligned in different directions and embedded.     UC Treatments / Results  Labs (all labs ordered are listed, but only abnormal results are displayed) Labs Reviewed - No data to display  EKG None  Radiology No results found.  Procedures Procedures (including critical care time)  Medications Ordered in UC Medications - No data to display  Initial Impression / Assessment and Plan / UC Course  I have reviewed the triage vital signs and the nursing notes.  Pertinent labs & imaging results that were available during my care of the patient were reviewed by me and considered in my medical decision making (see chart for details).    Final Clinical Impressions(s) / UC Diagnoses   Final diagnoses:  Wound of left lower extremity, initial encounter  Post-traumatic wound infection     Discharge Instructions     Wash with soap and water three times daily, followed by antibiotic cream and dressing.  Antibiotic tablet twice daily  Recheck in three days   ED Prescriptions    Medication Sig Dispense Auth. Provider   mupirocin ointment (BACTROBAN) 2 % Apply 1 application topically 3 (three) times daily. 22 g Elvina SidleLauenstein, Ivyana Locey, MD   doxycycline (VIBRA-TABS) 100 MG tablet Take 1 tablet (100 mg total) by mouth 2 (two) times daily. 20 tablet Elvina SidleLauenstein, Emberleigh Reily, MD     Controlled Substance Prescriptions [place] Controlled Substance Registry consulted? Not Applicable   Elvina SidleLauenstein, Aaliyah Cancro, MD 02/11/18 [id number]    Elvina SidleLauenstein, Brewer Hitchman, MD 02/11/18 [phone]

## 2018-03-23 ENCOUNTER — Emergency Department (HOSPITAL_COMMUNITY)
Admission: EM | Admit: 2018-03-23 | Discharge: 2018-03-24 | Disposition: A | Discharge status: Other Acute Inpatient Hospital | Payer: Medicaid Other | Attending: Emergency Medicine | Admitting: Emergency Medicine

## 2018-03-23 ENCOUNTER — Encounter (HOSPITAL_COMMUNITY): Payer: Self-pay

## 2018-03-23 DIAGNOSIS — F322 Major depressive disorder, single episode, severe without psychotic features: Secondary | ICD-10-CM | POA: Diagnosis not present

## 2018-03-23 DIAGNOSIS — F1020 Alcohol dependence, uncomplicated: Secondary | ICD-10-CM | POA: Diagnosis not present

## 2018-03-23 DIAGNOSIS — Z79899 Other long term (current) drug therapy: Secondary | ICD-10-CM | POA: Diagnosis not present

## 2018-03-23 DIAGNOSIS — F1010 Alcohol abuse, uncomplicated: Secondary | ICD-10-CM

## 2018-03-23 DIAGNOSIS — T1491XA Suicide attempt, initial encounter: Secondary | ICD-10-CM

## 2018-03-23 LAB — I-STAT BETA HCG BLOOD, ED (MC, WL, AP ONLY): I-stat hCG, quantitative: <5 m[IU]/mL (ref <5)

## 2018-03-23 LAB — RAPID URINE DRUG SCREEN, HOSP PERFORMED
Amphetamines: NOT DETECTED
Barbiturates: NOT DETECTED
Benzodiazepines: NOT DETECTED
Cocaine: NOT DETECTED
Opiates: NOT DETECTED
Tetrahydrocannabinol: NOT DETECTED

## 2018-03-23 LAB — COMPREHENSIVE METABOLIC PANEL
ALT: 73 U/L — ABNORMAL HIGH (ref 0–44)
AST: 274 U/L — ABNORMAL HIGH (ref 15–41)
Albumin: 3.5 g/dL (ref 3.5–5.0)
Alkaline Phosphatase: 208 U/L — ABNORMAL HIGH (ref 38–126)
Anion gap: 11 (ref 5–15)
BUN: 7 mg/dL (ref 6–20)
CHLORIDE: 105 mmol/L (ref 98–111)
CO2: 19 mmol/L — ABNORMAL LOW (ref 22–32)
Calcium: 8.8 mg/dL — ABNORMAL LOW (ref 8.9–10.3)
Creatinine, Ser: 0.65 mg/dL (ref 0.44–1.00)
GFR calc Af Amer: >60 mL/min (ref >60)
GFR calc non Af Amer: >60 mL/min (ref >60)
Glucose, Bld: 120 mg/dL — ABNORMAL HIGH (ref 70–99)
Potassium: 3.5 mmol/L (ref 3.5–5.1)
Sodium: 135 mmol/L (ref 135–145)
Total Bilirubin: 0.5 mg/dL (ref 0.3–1.2)
Total Protein: 7.4 g/dL (ref 6.5–8.1)

## 2018-03-23 LAB — CBC WITH DIFFERENTIAL/PLATELET
Abs Immature Granulocytes: 0.01 10*3/uL (ref 0.00–0.07)
Basophils Absolute: 0.1 10*3/uL (ref 0.0–0.1)
Basophils Relative: 1 %
Eosinophils Absolute: 0 10*3/uL (ref 0.0–0.5)
Eosinophils Relative: 1 %
HCT: 32.5 % — ABNORMAL LOW (ref 36.0–46.0)
Hemoglobin: 10.6 g/dL — ABNORMAL LOW (ref 12.0–15.0)
Immature Granulocytes: 0 %
Lymphocytes Relative: 37 %
Lymphs Abs: 1.9 10*3/uL (ref 0.7–4.0)
MCH: 31.5 pg (ref 26.0–34.0)
MCHC: 32.6 g/dL (ref 30.0–36.0)
MCV: 96.7 fL (ref 80.0–100.0)
Monocytes Absolute: 0.4 10*3/uL (ref 0.1–1.0)
Monocytes Relative: 8 %
Neutro Abs: 2.7 10*3/uL (ref 1.7–7.7)
Neutrophils Relative %: 53 %
Platelets: 177 10*3/uL (ref 150–400)
RBC: 3.36 MIL/uL — ABNORMAL LOW (ref 3.87–5.11)
RDW: 16.8 % — ABNORMAL HIGH (ref 11.5–15.5)
WBC: 5.2 10*3/uL (ref 4.0–10.5)
nRBC: 0 % (ref 0.0–0.2)

## 2018-03-23 LAB — ACETAMINOPHEN LEVEL: Acetaminophen (Tylenol), Serum: <10 ug/mL — ABNORMAL LOW (ref 10–30)

## 2018-03-23 LAB — ETHANOL: Alcohol, Ethyl (B): 41 mg/dL — ABNORMAL HIGH (ref <10)

## 2018-03-23 LAB — SALICYLATE LEVEL: Salicylate Lvl: <7 mg/dL (ref 2.8–30.0)

## 2018-03-23 MED ORDER — LORAZEPAM 2 MG/ML IJ SOLN: 1.0000 mg | Freq: Four times a day (QID) | INTRAMUSCULAR | Status: DC | PRN

## 2018-03-23 MED ORDER — ADULT MULTIVITAMIN W/MINERALS CH
1.0000 | ORAL_TABLET | Freq: Every day | ORAL | Status: DC
  Administered 2018-03-23 – 2018-03-24 (×2): 1 via ORAL
  Filled 2018-03-23 (×2): qty 1

## 2018-03-23 MED ORDER — LORAZEPAM 1 MG PO TABS: 0.0000 mg | ORAL_TABLET | Freq: Two times a day (BID) | ORAL | Status: DC

## 2018-03-23 MED ORDER — LORAZEPAM 1 MG PO TABS
0.0000 mg | ORAL_TABLET | Freq: Four times a day (QID) | ORAL | Status: DC
  Administered 2018-03-23 – 2018-03-24 (×2): 1 mg via ORAL
  Filled 2018-03-23 (×2): qty 1

## 2018-03-23 MED ORDER — THIAMINE HCL 100 MG/ML IJ SOLN: 100.0000 mg | Freq: Every day | INTRAMUSCULAR | Status: DC

## 2018-03-23 MED ORDER — VITAMIN B-1 100 MG PO TABS
100.0000 mg | ORAL_TABLET | Freq: Every day | ORAL | Status: DC
  Administered 2018-03-23 – 2018-03-24 (×2): 100 mg via ORAL
  Filled 2018-03-23 (×2): qty 1

## 2018-03-23 MED ORDER — LORAZEPAM 1 MG PO TABS: 1.0000 mg | ORAL_TABLET | Freq: Four times a day (QID) | ORAL | Status: DC | PRN

## 2018-03-23 MED ORDER — FOLIC ACID 1 MG PO TABS
1.0000 mg | ORAL_TABLET | Freq: Every day | ORAL | Status: DC
  Administered 2018-03-23 – 2018-03-24 (×2): 1 mg via ORAL
  Filled 2018-03-23 (×2): qty 1

## 2018-03-23 NOTE — ED Notes (Signed)
TTS in progress 

## 2018-03-23 NOTE — BH Assessment (Addendum)
Tele Assessment Note   Patient Name: Susan Mcconnell MRN: [id number] Referring Physician: Chaney Malling, MD Location of Patient: [facility], [phone] Location of Provider: Behavioral Health TTS Department  Susan Mcconnell is an 43 y.o. female who presents unaccompanied to [facility] ED reporting alcohol abuse, depressive symptoms and suicidal ideation. Pt speaks [name] and translation was provided by [employer] via telephone. Pt says she is here because she wants to be given medication to help reduce her alcohol use. Per EDP note, social services were involved and due to her drinking habits and depression, told her that she needs to come to the ER or else she may lose custody of her kids. During assessment Pt initially denied any recent suicidal ideation or history of suicide attempts. When asked why the EDP had documented that two weeks ago Pt had attempted suicide by hanging herself, Pt confirmed that she did attempt to hang herself two weeks ago in response to the prospect of becoming homeless and that her family had intervened to save her. Pt acknowledges she still feels very depressed. Pt denies any history of intentional self-injurious behaviors. Pt denies current homicidal ideation or history of violence. Pt denies any history of auditory or visual hallucinations.  Pt reports drinking 1-2 beers daily. Pt's medical record indicates Pt has presented to the ED several times with critically high alcohol levels, such as 535. Pt denies history of withdrawal symptoms, however Pt's medical record indicates inpatient medical detox. Pt denies other substance use and urine drug screen is negative. Pt denies any history inpatient substance abuse treatment.  Pt reports she lives with her brother and her two sons. She says she has no family or friends she considers supportive. She says she is unemployed and has no Architect. She denies legal problems. She denies access to firearms.  Pt is dressed in  hospital scrubs, alert and oriented x4. Pt speaks in a clear tone, at moderate volume and normal pace. Motor behavior appears normal. Eye contact is good. Pt's mood is euthymic and affect is anxious. Thought process is coherent and relevant. There is no indication Pt is currently responding to internal stimuli or experiencing delusional thought content.    Diagnosis:  F32.2 Major depressive disorder, Single episode, Severe F10.20 Alcohol use disorder, Severe  Past Medical History:  Past Medical History:  Diagnosis Date  . Alcoholic hepatitis   . Pneumonia   . TB (tuberculosis) ~ 2017   "took the RX and was healed" (05/04/2017)    Past Surgical History:  Procedure Laterality Date  . OTHER SURGICAL HISTORY  2010   "birth control surgery; don't know what they did"    Family History:  Family History  Family history unknown: Yes    Social History:  reports that she has never smoked. Her smokeless tobacco use includes chew. She reports current alcohol use of about 21.0 standard drinks of alcohol per week. She reports that she does not use drugs.  Additional Social History:  Alcohol / Drug Use Pain Medications: Denies use Prescriptions: Denies use Over the Counter: Denies use History of alcohol / drug use?: Yes Longest period of sobriety (when/how long): Pt unable to answer Negative Consequences of Use: Personal relationships Withdrawal Symptoms: (Pt denies) Substance #1 Name of Substance 1: Alcohol 1 - Age of First Use: Unknown 1 - Amount (size/oz): "1 or 2 cans of beer" 1 - Frequency: Daily  1 - Duration: Ongoing 1 - Last Use / Amount: 03/23/18, 1 can of beer  CIWA: CIWA-Ar BP: 125/84 Pulse Rate: (!) 105 Nausea and Vomiting: no nausea and no vomiting Tactile Disturbances: none Tremor: two Auditory Disturbances: not present Paroxysmal Sweats: no sweat visible Visual Disturbances: not present Anxiety: mildly anxious Headache, Fullness in Head: mild Agitation: normal  activity Orientation and Clouding of Sensorium: oriented and can do serial additions CIWA-Ar Total: 5 COWS:    Allergies: No Known Allergies  Home Medications: (Not in a hospital admission)   OB/GYN Status:  No LMP recorded.  General Assessment Data Location of Assessment: [facility] ED TTS Assessment: In system Is this a Tele or Face-to-Face Assessment?: Tele Assessment Is this an Initial Assessment or a Re-assessment for this encounter?: Initial Assessment Patient Accompanied by:: N/A Language Other than English: No Living Arrangements: Other (Comment)(Lives with 2 sons and brother) What gender do you identify as?: Female Marital status: Single Maiden name: NA Pregnancy Status: No Living Arrangements: Children Can pt return to current living arrangement?: Yes Admission Status: Voluntary Is patient capable of signing voluntary admission?: Yes Referral Source: Other(Social Services) Insurance type: Self-pay     Crisis Care Plan Living Arrangements: Children Legal Guardian: Other:(Self) Name of Psychiatrist: None Name of Therapist: None  Education Status Is patient currently in school?: No Is the patient employed, unemployed or receiving disability?: Unemployed  Risk to self with the past 6 months Suicidal Ideation: No Has patient been a risk to self within the past 6 months prior to admission? : Yes Suicidal Intent: No Has patient had any suicidal intent within the past 6 months prior to admission? : Yes Is patient at risk for suicide?: Yes Suicidal Plan?: Yes-Currently Present Has patient had any suicidal plan within the past 6 months prior to admission? : Yes Specify Current Suicidal Plan: Pt attempted to hang herself two weeks ago Access to Means: Yes Specify Access to Suicidal Means: Access to rope What has been your use of drugs/alcohol within the last 12 months?: Pt drinking alcohol daily Previous Attempts/Gestures: Yes How many times?: 1 Other Self Harm Risks:  None Triggers for Past Attempts: Other (Comment)(homeless) Intentional Self Injurious Behavior: None Family Suicide History: Unknown Recent stressful life event(s): Financial Problems, Job Loss Persecutory voices/beliefs?: No Depression: Yes Depression Symptoms: Despondent, Feeling worthless/self pity, Guilt Substance abuse history and/or treatment for substance abuse?: No Suicide prevention information given to non-admitted patients: Not applicable  Risk to Others within the past 6 months Homicidal Ideation: No Does patient have any lifetime risk of violence toward others beyond the six months prior to admission? : No Thoughts of Harm to Others: No Current Homicidal Intent: No Current Homicidal Plan: No Access to Homicidal Means: No Identified Victim: None History of harm to others?: No Assessment of Violence: None Noted Violent Behavior Description: Pt denies history of violence Does patient have access to weapons?: No Criminal Charges Pending?: No Does patient have a court date: No Is patient on probation?: No  Psychosis Hallucinations: None noted Delusions: None noted  Mental Status Report Appearance/Hygiene: In scrubs Eye Contact: Fair Motor Activity: Unremarkable Speech: Logical/coherent Level of Consciousness: Alert Mood: Euthymic Affect: Anxious Anxiety Level: Minimal Thought Processes: Coherent, Relevant Judgement: Partial Orientation: Person, Place, Time, Situation Obsessive Compulsive Thoughts/Behaviors: None  Cognitive Functioning Concentration: Normal Memory: Remote Intact, Recent Intact Is patient IDD: No Insight: Poor Impulse Control: Fair Appetite: Fair Have you had any weight changes? : No Change Sleep: No Change Total Hours of Sleep: 8 Vegetative Symptoms: None  ADLScreening [facility] Assessment Services) Patient's cognitive ability adequate to safely complete daily  activities?: Yes Patient able to express need for assistance with ADLs?:  Yes Independently performs ADLs?: Yes (appropriate for developmental age)  Prior Inpatient Therapy Prior Inpatient Therapy: No  Prior Outpatient Therapy Prior Outpatient Therapy: No Does patient have an ACCT team?: No Does patient have Intensive In-House Services?  : No Does patient have [facility] services? : No Does patient have P4CC services?: No  ADL Screening (condition at time of admission) Patient's cognitive ability adequate to safely complete daily activities?: Yes Is the patient deaf or have difficulty hearing?: No Does the patient have difficulty seeing, even when wearing glasses/contacts?: No Does the patient have difficulty concentrating, remembering, or making decisions?: No Patient able to express need for assistance with ADLs?: Yes Does the patient have difficulty dressing or bathing?: No Independently performs ADLs?: Yes (appropriate for developmental age) Does the patient have difficulty walking or climbing stairs?: No Weakness of Legs: None Weakness of Arms/Hands: None       Abuse/Neglect Assessment (Assessment to be complete while patient is alone) Abuse/Neglect Assessment Can Be Completed: Yes Physical Abuse: Denies Verbal Abuse: Denies Sexual Abuse: Denies Exploitation of patient/patient's resources: Denies Self-Neglect: Denies     Merchant navy officer (For Healthcare) Does Patient Have a Medical Advance Directive?: No Would patient like information on creating a medical advance directive?: No - Patient declined          Disposition: [facility], [facility], confirmed adult unit is currently at capacity. Gave clinical report to Nira Conn, NP who said Pt meets criteria for inpatient psychiatric treatment. TTS will contact other facilities for placement. Notified Dr. Chaney Malling and Orvil Feil, RN of recommendation.  Disposition Initial Assessment Completed for this Encounter: Yes  This service was provided via telemedicine using a 2-way,  interactive audio and video technology.  Names of all persons participating in this telemedicine service and their role in this encounter. Name: Aaryn Ames Role: Patient  Name: Shela Commons, [facility] Role: TTS counselor         Harlin Rain Patsy [place], [facility], [facility], [facility] Triage Specialist [phone]  Pamalee Leyden 03/23/2018 9:10 PM

## 2018-03-23 NOTE — ED Notes (Signed)
Patient's belongings inventoried and placed in locker # 6-Monique,RN  

## 2018-03-23 NOTE — ED Provider Notes (Signed)
[facility] EMERGENCY DEPARTMENT Provider Note   CSN: [id number] Arrival date & time: 03/23/18  1647    History   Chief Complaint Chief Complaint  Patient presents with  . Suicidal  . Alcohol Problem    HPI Susan Mcconnell is a 43 y.o. female hx of chronic alcohol use, here presenting with suicidal ideation, depression, possible withdrawal.  Patient states that she drinks 1-2 beers daily.  She states that about 2 weeks ago, she felt very depressed because she did not have money for rent so she thought about hanging herself.  Patient states that she eventually decided not to hang herself and still feels very depressed.  She denies any actual plan to kill herself or others.  Social services were involved and due to her drinking habits and depression, told her that she needs to come to the ER or else she may lose custody of her kids.  Patient denies any previous psychiatric admissions.  Patient admits to drinking alcohol and most recently was yesterday.  She denies any hallucinations or fevers or vomiting.     The history is provided by the patient.    Past Medical History:  Diagnosis Date  . Alcoholic hepatitis   . Pneumonia   . TB (tuberculosis) ~ 2017   "took the RX and was healed" (05/04/2017)    Patient Active Problem List   Diagnosis Date Noted  . Alcohol use disorder, severe, dependence (HCC)   . Hypernatremia 08/15/2017  . Suicidal ideation 08/15/2017  . Alcohol intoxication (HCC) 08/15/2017  . Sepsis due to pneumonia (HCC) 06/24/2017  . Alcoholic intoxication without complication (HCC)   . HCAP (healthcare-associated pneumonia)   . Hypotension due to hypovolemia   . Hepatitis   . Hypertriglyceridemia 05/06/2017  . Compensated metabolic acidosis 05/06/2017  . Pancytopenia (HCC)   . Thrombocytopenia (HCC) 05/04/2017  . Hypokalemia 05/04/2017  . Abnormal LFTs 05/04/2017  . Chest pain 05/04/2017  . Protein-calorie malnutrition, moderate (HCC) 05/04/2017   . Elevated liver enzymes     Past Surgical History:  Procedure Laterality Date  . OTHER SURGICAL HISTORY  2010   "birth control surgery; don't know what they did"     OB History   No obstetric history on file.      Home Medications    Prior to Admission medications   Medication Sig Start Date End Date Taking? Authorizing Provider  doxycycline (VIBRA-TABS) 100 MG tablet Take 1 tablet (100 mg total) by mouth 2 (two) times daily. 02/11/18   Elvina Sidle, MD  folic acid (FOLVITE) 1 MG tablet Take 1 tablet (1 mg total) by mouth daily. 08/18/17   Elgergawy, Leana Roe, MD  lactulose (CHRONULAC) 10 GM/15ML solution Take 45 mLs (30 g total) by mouth 2 (two) times daily. 08/17/17   Elgergawy, Leana Roe, MD  mupirocin ointment (BACTROBAN) 2 % Apply 1 application topically 3 (three) times daily. 02/11/18   Elvina Sidle, MD  thiamine 100 MG tablet Take 1 tablet (100 mg total) by mouth daily. 08/18/17   Elgergawy, Leana Roe, MD    Family History Family History  Family history unknown: Yes    Social History Social History   Tobacco Use  . Smoking status: Never Smoker  . Smokeless tobacco: Current User    Types: Chew  Substance Use Topics  . Alcohol use: Yes    Alcohol/week: 21.0 standard drinks    Types: 21 Cans of beer per week    Comment: 05/04/2017 "1 can, 3 times/day"  .  Drug use: Never     Allergies   Patient has no known allergies.   Review of Systems Review of Systems  Psychiatric/Behavioral: Positive for dysphoric mood.  All other systems reviewed and are negative.    Physical Exam Updated Vital Signs BP 120/77 (BP Location: Left Arm)   Pulse 97   Temp 98.5 F (36.9 C) (Oral)   Resp 16   SpO2 99%   Physical Exam Vitals signs and nursing note reviewed.  HENT:     Head: Normocephalic.     Nose: Nose normal.     Mouth/Throat:     Mouth: Mucous membranes are moist.  Eyes:     Pupils: Pupils are equal, round, and reactive to light.  Neck:      Musculoskeletal: Normal range of motion.  Cardiovascular:     Rate and Rhythm: Normal rate.     Pulses: Normal pulses.     Heart sounds: Normal heart sounds.  Pulmonary:     Effort: Pulmonary effort is normal.     Breath sounds: Normal breath sounds.  Abdominal:     General: Abdomen is flat.     Palpations: Abdomen is soft.  Musculoskeletal: Normal range of motion.  Skin:    General: Skin is warm.     Capillary Refill: Capillary refill takes less than 2 seconds.  Neurological:     General: No focal deficit present.     Mental Status: She is alert and oriented to person, place, and time.  Psychiatric:     Comments: Depressed       ED Treatments / Results  Labs (all labs ordered are listed, but only abnormal results are displayed) Labs Reviewed  CBC WITH DIFFERENTIAL/PLATELET  COMPREHENSIVE METABOLIC PANEL  ETHANOL  SALICYLATE LEVEL  ACETAMINOPHEN LEVEL  RAPID URINE DRUG SCREEN, HOSP PERFORMED  I-STAT BETA HCG BLOOD, ED ([facility], [facility], [facility] ONLY)    EKG None  Radiology No results found.  Procedures Procedures (including critical care time)  Medications Ordered in ED Medications - No data to display   Initial Impression / Assessment and Plan / ED Course  I have reviewed the triage vital signs and the nursing notes.  Pertinent labs & imaging results that were available during my care of the patient were reviewed by me and considered in my medical decision making (see chart for details).       Susan Mcconnell is a 43 y.o. female here with depression, alcohol use. Patient's last drink was yesterday. She is depressed but no obvious plan to commit suicide even though she contemplated hanging herself 2 weeks ago. Will get labs, ETOH, UDS. Will start on CIWA and consult TTS.   6:46 PM Labs showed slightly elevated LFTs likely from alcohol use. Abdomen nontender. ETOH 41. Medically cleared for psych eval.    Final Clinical Impressions(s) / ED Diagnoses   Final diagnoses:    None    ED Discharge Orders    None       Charlynne Pander, MD 03/23/18 2013

## 2018-03-23 NOTE — ED Triage Notes (Signed)
Pt speaks [name]. Per note with pt, pt brought in to assess for detox and psych evaluation. Pt has symptoms of alcohol withdrawal. 2 weeks ago she attempted suicide by hanging and has recurrent thoughts of suicide.

## 2018-03-23 NOTE — ED Notes (Signed)
Pt given turkey sandwich and sprite.  

## 2018-03-23 NOTE — ED Notes (Signed)
Pt denies SI/HI at this time.  

## 2018-03-24 ENCOUNTER — Encounter (HOSPITAL_COMMUNITY): Payer: Self-pay | Admitting: *Deleted

## 2018-03-24 ENCOUNTER — Other Ambulatory Visit: Payer: Self-pay

## 2018-03-24 ENCOUNTER — Inpatient Hospital Stay (HOSPITAL_COMMUNITY)
Admission: AD | Admit: 2018-03-24 | Discharge: 2018-04-03 | DRG: 885 | Disposition: A | Discharge status: Rehabilitation Facility | Payer: Federal, State, Local not specified - Other | Source: Intra-hospital | Attending: Psychiatry | Admitting: Psychiatry

## 2018-03-24 DIAGNOSIS — K7010 Alcoholic hepatitis without ascites: Secondary | ICD-10-CM | POA: Diagnosis present

## 2018-03-24 DIAGNOSIS — Y902 Blood alcohol level of 40-59 mg/100 ml: Secondary | ICD-10-CM | POA: Diagnosis present

## 2018-03-24 DIAGNOSIS — Z915 Personal history of self-harm: Secondary | ICD-10-CM | POA: Diagnosis not present

## 2018-03-24 DIAGNOSIS — Z79899 Other long term (current) drug therapy: Secondary | ICD-10-CM | POA: Diagnosis not present

## 2018-03-24 DIAGNOSIS — Z8611 Personal history of tuberculosis: Secondary | ICD-10-CM | POA: Diagnosis not present

## 2018-03-24 DIAGNOSIS — F322 Major depressive disorder, single episode, severe without psychotic features: Principal | ICD-10-CM | POA: Diagnosis present

## 2018-03-24 DIAGNOSIS — Z560 Unemployment, unspecified: Secondary | ICD-10-CM

## 2018-03-24 DIAGNOSIS — F17220 Nicotine dependence, chewing tobacco, uncomplicated: Secondary | ICD-10-CM | POA: Diagnosis present

## 2018-03-24 DIAGNOSIS — F10129 Alcohol abuse with intoxication, unspecified: Secondary | ICD-10-CM | POA: Diagnosis present

## 2018-03-24 DIAGNOSIS — R45851 Suicidal ideations: Secondary | ICD-10-CM | POA: Diagnosis present

## 2018-03-24 DIAGNOSIS — F1010 Alcohol abuse, uncomplicated: Secondary | ICD-10-CM

## 2018-03-24 DIAGNOSIS — Z23 Encounter for immunization: Secondary | ICD-10-CM

## 2018-03-24 MED ORDER — ALUM & MAG HYDROXIDE-SIMETH 200-200-20 MG/5ML PO SUSP
30.0000 mL | ORAL | Status: DC | PRN
  Administered 2018-03-25: 30 mL via ORAL
  Filled 2018-03-24: qty 30

## 2018-03-24 MED ORDER — THIAMINE HCL 100 MG/ML IJ SOLN: 100.0000 mg | Freq: Once | INTRAMUSCULAR | Status: DC

## 2018-03-24 MED ORDER — LOPERAMIDE HCL 2 MG PO CAPS
2.0000 mg | ORAL_CAPSULE | ORAL | Status: AC | PRN
  Filled 2018-03-24: qty 2

## 2018-03-24 MED ORDER — ONDANSETRON 4 MG PO TBDP: 4.0000 mg | ORAL_TABLET | Freq: Four times a day (QID) | ORAL | Status: AC | PRN

## 2018-03-24 MED ORDER — NICOTINE POLACRILEX 2 MG MT GUM: 2.0000 mg | CHEWING_GUM | OROMUCOSAL | Status: DC | PRN

## 2018-03-24 MED ORDER — ACETAMINOPHEN 325 MG PO TABS
650.0000 mg | ORAL_TABLET | Freq: Four times a day (QID) | ORAL | Status: DC | PRN
  Administered 2018-03-25: 650 mg via ORAL
  Filled 2018-03-24: qty 2

## 2018-03-24 MED ORDER — ADULT MULTIVITAMIN W/MINERALS CH
1.0000 | ORAL_TABLET | Freq: Every day | ORAL | Status: DC
  Administered 2018-03-25 – 2018-03-27 (×3): 1 via ORAL
  Filled 2018-03-24 (×5): qty 1

## 2018-03-24 MED ORDER — HYDROXYZINE HCL 25 MG PO TABS
25.0000 mg | ORAL_TABLET | Freq: Four times a day (QID) | ORAL | Status: AC | PRN
  Administered 2018-03-24: 25 mg via ORAL
  Filled 2018-03-24: qty 1

## 2018-03-24 MED ORDER — HYDROXYZINE HCL 25 MG PO TABS: 25.0000 mg | ORAL_TABLET | Freq: Three times a day (TID) | ORAL | Status: DC | PRN

## 2018-03-24 MED ORDER — LORAZEPAM 1 MG PO TABS: 1.0000 mg | ORAL_TABLET | Freq: Four times a day (QID) | ORAL | Status: AC | PRN

## 2018-03-24 MED ORDER — VITAMIN B-1 100 MG PO TABS
100.0000 mg | ORAL_TABLET | Freq: Every day | ORAL | Status: DC
  Administered 2018-03-25 – 2018-04-03 (×10): 100 mg via ORAL
  Filled 2018-03-24 (×11): qty 1
  Filled 2018-03-24: qty 7
  Filled 2018-03-24: qty 1

## 2018-03-24 MED ORDER — INFLUENZA VAC SPLIT QUAD 0.5 ML IM SUSY
0.5000 mL | PREFILLED_SYRINGE | INTRAMUSCULAR | Status: AC
  Administered 2018-03-25: 0.5 mL via INTRAMUSCULAR
  Filled 2018-03-24: qty 0.5

## 2018-03-24 MED ORDER — NICOTINE 21 MG/24HR TD PT24
21.0000 mg | MEDICATED_PATCH | Freq: Every day | TRANSDERMAL | Status: DC
  Administered 2018-03-24 – 2018-04-03 (×9): 21 mg via TRANSDERMAL
  Filled 2018-03-24 (×13): qty 1

## 2018-03-24 MED ORDER — MAGNESIUM HYDROXIDE 400 MG/5ML PO SUSP: 30.0000 mL | Freq: Every day | ORAL | Status: DC | PRN

## 2018-03-24 MED ORDER — TRAZODONE HCL 50 MG PO TABS
50.0000 mg | ORAL_TABLET | Freq: Every evening | ORAL | Status: DC | PRN
  Administered 2018-03-24: 50 mg via ORAL
  Filled 2018-03-24: qty 1

## 2018-03-24 NOTE — ED Provider Notes (Signed)
Patient presented 03/23/2018 for suicidal ideation, depression and possible withdrawal from alcohol.  Patiently medically cleared on 03/23/2018.  Patient evaluated by Davonna Belling, Counselor on 03/23/18.  Discussed with Gery Pray, NP who recommends inpatient psych treatment.  Current beds at [facility] at capacity.  Patient awaiting inpatient placement.  Will continue to monitor.  Patient sleeping on evaluation.  No complaints.  Hemodynamically stable at this time.  Given 1 mg Ativan p.o. for CIWA score of 5.  No overt signs of alcohol withdrawal at this time.  Will continue to monitor until placement.   Corita Allinson A, PA-C 03/24/18 0800    Cathren Laine, MD 03/24/18 [phone]

## 2018-03-24 NOTE — ED Notes (Signed)
Pt on phone at nurses' desk - Visitor has arrived.

## 2018-03-24 NOTE — Progress Notes (Signed)
Patient did not attend the evening speaker AA meeting.  

## 2018-03-24 NOTE — ED Notes (Signed)
Pt voiced understanding and agreement w/tx plan via [place] interpretor - accepted to [facility] - signed consent form - copy faxed to [facility], copy sent to Medical Records, and original placed in envelope for [facility] Tx.

## 2018-03-24 NOTE — BH Assessment (Signed)
TTS Reassessment:  Pt presents for reassessment, dressed in scrubs sitting in bed. Pt appears disheveled. Interpreter service was used due to pt speaks [place].  Pt reports she has 3 children, 21, 15 & 10. Her oldest child & sister are caring for the younger 2 at this time & will help with getting them to school. Pt states DSS required her to get tx for alcohol abuse. Pt reports she drinks about one can of beer a day. She states she is limited financially & at times friends will buy her a drink. Pt reports feeling stressed as reason for alcohol use. Pt denies current SI. She states last time she felt suicidal was about 2-3 weeks ago. Clear answers did not come easily due to a lot of back & forth between interpreter & pt, seeming like clarification, but then simple answers were incomplete. Interpreter eventually abruptly ended call. There was no indication was responding to internal stimuli.

## 2018-03-24 NOTE — BH Assessment (Signed)
Pt's nurse, Dionicio Stall advised by phone that pt has been accepted to [facility] Rm 303-2. Accepting Reola Calkins, NP & attending, Dr. Jama Flavors. Becky, RN, agreeable to [employer] pt's signed voluntary form prior to pt's discharge from [facility].

## 2018-03-24 NOTE — ED Notes (Signed)
Meds and ID bracelet were scanned when given - did not get entered into [facility] - IT aware.

## 2018-03-24 NOTE — ED Notes (Signed)
Lunch tray ordered 

## 2018-03-24 NOTE — Progress Notes (Signed)
Patient ID: Susan Mcconnell, female   DOB: 08-11-1975, 43 y.o.   MRN: [id number]   43 year old female admitted after it was reported that pt was suicidal with plan to hang self. Pt reported at time of admission that her oldest son did not pay her rent and that the cops locked her house. Pt reported that she had no where to go with her other two children, pt reported that she has a 22 and 41 year old. Pt reported at that time she was suicidal, but reports now that she is back in her house she is fine and just wants to go home. Pt reported that her children were at home with her sister, and that she was fine. Pt did admit that she drinks beers, and that she would drink everyday all day if she had the money but she is currently not working. Pt reported that she had no medical issues, no allergies, and no current issues or concerns. Pt was oriented to the unit, using a interpretor service for [name]. The interpretor helped with admission process, pt reported no other issues or concerns.

## 2018-03-24 NOTE — ED Notes (Signed)
Pt's friend - emergency contact Marlena Clipper [phone] - visited w/pt. Per pt's request, she is aware pt has been accepted to [facility].

## 2018-03-24 NOTE — Tx Team (Signed)
Initial Treatment Plan 03/24/2018 4:10 PM [name] [id number]:[id number]    PATIENT STRESSORS: Financial difficulties Substance abuse   PATIENT STRENGTHS: Average or above average intelligence General fund of knowledge   PATIENT IDENTIFIED PROBLEMS: Depression    Alcohol Abuse                 DISCHARGE CRITERIA:  Improved stabilization in mood, thinking, and/or behavior Need for constant or close observation no longer present  PRELIMINARY DISCHARGE PLAN: Return to previous living arrangement  PATIENT/FAMILY INVOLVEMENT: This treatment plan has been presented to and reviewed with the patient, Susan Mcconnell, and/or family member.  The patient and family have been given the opportunity to ask questions and make suggestions.  Buford Dresser, RN 03/24/2018, 4:10 PM

## 2018-03-24 NOTE — ED Notes (Signed)
ALL belongings - 1 labeled belongings bag - [place]. Pt aware.  

## 2018-03-25 DIAGNOSIS — F322 Major depressive disorder, single episode, severe without psychotic features: Principal | ICD-10-CM

## 2018-03-25 LAB — TSH: TSH: 7.041 u[IU]/mL — ABNORMAL HIGH (ref 0.350–4.500)

## 2018-03-25 MED ORDER — PRENATAL MULTIVITAMIN CH
1.0000 | ORAL_TABLET | Freq: Every day | ORAL | Status: DC
  Administered 2018-03-25 – 2018-04-03 (×9): 1 via ORAL
  Filled 2018-03-25 (×7): qty 1
  Filled 2018-03-25: qty 7
  Filled 2018-03-25 (×6): qty 1

## 2018-03-25 MED ORDER — TRAZODONE HCL 100 MG PO TABS
100.0000 mg | ORAL_TABLET | Freq: Every evening | ORAL | Status: DC | PRN
  Administered 2018-03-25 – 2018-04-02 (×9): 100 mg via ORAL
  Filled 2018-03-25: qty 7
  Filled 2018-03-25 (×6): qty 1
  Filled 2018-03-25: qty 7
  Filled 2018-03-25 (×2): qty 1

## 2018-03-25 MED ORDER — CHLORDIAZEPOXIDE HCL 25 MG PO CAPS
25.0000 mg | ORAL_CAPSULE | Freq: Three times a day (TID) | ORAL | Status: AC
  Administered 2018-03-25 – 2018-03-26 (×5): 25 mg via ORAL
  Filled 2018-03-25 (×5): qty 1

## 2018-03-25 MED ORDER — FLUOXETINE HCL 20 MG PO CAPS
20.0000 mg | ORAL_CAPSULE | Freq: Every day | ORAL | Status: DC
  Administered 2018-03-25 – 2018-04-03 (×10): 20 mg via ORAL
  Filled 2018-03-25 (×9): qty 1
  Filled 2018-03-25: qty 7
  Filled 2018-03-25 (×3): qty 1

## 2018-03-25 NOTE — Progress Notes (Signed)
CSW requested interpreter through 02/26, as patient speaks [place].  Enid Cutter, LCSW-A Clinical Social Worker

## 2018-03-25 NOTE — BHH Group Notes (Signed)
LCSW Group Therapy Notes 03/25/2018 2:44 PM  Type of Therapy and Topic: Group Therapy: Overcoming Obstacles  Participation Level: Did Not Attend  Description of Group:  In this group patients will be encouraged to explore what they see as obstacles to their own wellness and recovery. They will be guided to discuss their thoughts, feelings, and behaviors related to these obstacles. The group will process together ways to cope with barriers, with attention given to specific choices patients can make. Each patient will be challenged to identify changes they are motivated to make in order to overcome their obstacles. This group will be process-oriented, with patients participating in exploration of their own experiences as well as giving and receiving support and challenge from other group members.  Therapeutic Goals: 1. Patient will identify personal and current obstacles as they relate to admission. 2. Patient will identify barriers that currently interfere with their wellness or overcoming obstacles.  3. Patient will identify feelings, thought process and behaviors related to these barriers. 4. Patient will identify two changes they are willing to make to overcome these obstacles:   Summary of Patient Progress  Patient did not attend group; her interpreter is not at [facility] at this time.   Therapeutic Modalities:  Cognitive Behavioral Therapy Solution Focused Therapy Motivational Interviewing Relapse Prevention Therapy  Enid Cutter, MSW, [facility] 03/25/2018 2:44 PM

## 2018-03-25 NOTE — Progress Notes (Signed)
D:  Patient has been in bed most of the day sleeping.  Lunch as warmed up for patient and she sat up in bed eating.  Also ate small amount of snack.  Patient has drank fluids today. A:  Medications administered per MD orders.  R:  Patient denied SI.  Denied A/V hallucinations.  Denied pain. Patient stated she was "cold" this morning and extra blankets were put on patient.  Patient shakes her head yes and no.

## 2018-03-25 NOTE — [facility] Suicide Risk Assessment (Signed)
[facility] Admission Suicide Risk Assessment   Nursing information obtained from:  Patient Demographic factors:  Low socioeconomic status Current Mental Status:  NA Loss Factors:  Financial problems / change in socioeconomic status Historical Factors:  NA Risk Reduction Factors:  Sense of responsibility to family, Responsible for children under 41 years of age  Total Time spent with patient: 45 minutes Principal Problem: safety Concerns, drinking daily Diagnosis:  Active Problems:   MDD (major depressive disorder), severe (HCC)  Subjective Data: Alcoholism, severe depression  Continued Clinical Symptoms:  Alcohol Use Disorder Identification Test Final Score (AUDIT): 12 The "Alcohol Use Disorders Identification Test", Guidelines for Use in Primary Care, Second Edition.  World Science writer [facility]). Score between 0-7:  no or low risk or alcohol related problems. Score between 8-15:  moderate risk of alcohol related problems. Score between 16-19:  high risk of alcohol related problems. Score 20 or above:  warrants further diagnostic evaluation for alcohol dependence and treatment.   CLINICAL FACTORS:   Alcohol/Substance Abuse/Dependencies       COGNITIVE FEATURES THAT CONTRIBUTE TO RISK:  None    SUICIDE RISK:   Minimal: No identifiable suicidal ideation.  Patients presenting with no risk factors but with morbid ruminations; may be classified as minimal risk based on the severity of the depressive symptoms  PLAN OF CARE: Detox further eval  I certify that inpatient services furnished can reasonably be expected to improve the patient's condition.   Malvin Johns, MD 03/25/2018, 10:34 AM

## 2018-03-25 NOTE — Progress Notes (Signed)
D: Patient observed up and restless on unit. Intrusive with poor boundaries. Sat beside roommate as roommate attempted to spend time with visitors. Asking roommate and staff frequently for assistance. Observed to be anxious, restless, and has difficulty waiting for requests to be met. Via interpreting services through [employer], patient insistent she is having no withdrawal symptoms. Patient states, "I am done with the alcohol. No more. But I need my chewing tobacco so I can go to sleep."  Patient's affect animated, anxious with congruent mood. Denies pain. VSS.   A: Medicated per orders. Offered gum however patient stated she wanted to stay with the patch for nicotine replacement. Prn vistaril and trazadone given for sleep per her request. Medication education provided. Level III obs in place for safety. Emotional support offered. Patient encouraged to complete Suicide Safety Plan before discharge. Encouraged to attend and participate in unit programming.  Patient redirected as needed. Roommate had to be moved as she could not tolerate patient's behavior and poor boundaries.   R: Patient verbalizes understanding of POC. On reassess, patient was resting in bed and eventually was observed to be asleep. Patient denies SI/HI/AVH and remains safe on level III obs. Will continue to monitor throughout the night.

## 2018-03-25 NOTE — H&P (Signed)
Psychiatric Admission Assessment Adult  Patient Identification: Susan Mcconnell MRN:  [id number] Date of Evaluation:  03/25/2018 Chief Complaint:  MDD Alcohol Use disorder Principal Diagnosis: Suicidality in the context of daily drinking and financial stress Diagnosis:  Active Problems:   MDD (major depressive disorder), severe (HCC)  History of Present Illness:   This is the first admission for detox and safety purposes for this 43 year old [place] woman who is dependent on alcohol.  There were concerns about safety reportedly she tried to hang herself recently but was stopped by family members. She reports daily drinking and reports thoughts of harming herself due to financial stressors.  She fears she may lose her home.  She fears she may lose custody of her children. Despite her small stature previous encounters in the healthcare system have shown extremely high blood alcohol leavens a month ago was 382, 7 months ago 535, 9 months ago for 73.  She tells me she drinks "a little" and makes as gesture of smallness with her fingers however her blood alcohol level on this presentation was lower than her usual amount, it was 41 Lipase was elevated at 53 although not much AST elevated to 74 ALT 73  Her history is generally consistent from examiner to examiner she is now alert oriented to person place time and situation affect is constricted she denies wanting to harm her self and is focused on leaving by Wednesday to go to work -not sure of the accuracy of this report   According to our assessment team yesterday - Susan Mcconnell is an 43 y.o. female who presents unaccompanied to [facility] reporting alcohol abuse, depressive symptoms and suicidal ideation. Pt speaks [name] and translation was provided by [employer] via telephone. Pt says she is here because she wants to be given medication to help reduce her alcohol use. Per EDP note, social services were involved and due to her drinking  habits and depression, told her that she needs to come to the ER or else she may lose custody of her kids. During assessment Pt initially denied any recent suicidal ideation or history of suicide attempts. When asked why the EDP had documented that two weeks ago Pt had attempted suicide by hanging herself, Pt confirmed that she did attempt to hang herself two weeks ago in response to the prospect of becoming homeless and that her family had intervened to save her. Pt acknowledges she still feels very depressed. Pt denies any history of intentional self-injurious behaviors. Pt denies current homicidal ideation or history of violence. Pt denies any history of auditory or visual hallucinations.  Pt reports drinking 1-2 beers daily. Pt's medical record indicates Pt has presented to the ED several times with critically high alcohol levels, such as 535. Pt denies history of withdrawal symptoms, however Pt's medical record indicates inpatient medical detox. Pt denies other substance use and urine drug screen is negative. Pt denies any history inpatient substance abuse treatment.  Pt reports she lives with her brother and her two sons. She says she has no family or friends she considers supportive. She says she is unemployed and has no Pensions consultant. She denies legal problems. She denies access to firearms.  Pt is dressed in hospital scrubs, alert and oriented x4. Pt speaks in a clear tone, at moderate volume and normal pace. Motor behavior appears normal. Eye contact is good. Pt's mood is euthymic and affect is anxious. Thought process is coherent and relevant. There is no indication Pt is currently responding  to internal stimuli or experiencing delusional thought content.  Associated Signs/Symptoms: Depression Symptoms:  feelings of worthlessness/guilt, (Hypo) Manic Symptoms:  n/a Anxiety Symptoms: Financial worries Psychotic Symptoms:  n/a PTSD Symptoms: NA Total Time spent with patient: 45  minutes  Is the patient at risk to self? Yes.    Has the patient been a risk to self in the past 6 months? No.  Has the patient been a risk to self within the distant past? No.  Is the patient a risk to others? No.  Has the patient been a risk to others in the past 6 months? No.  Has the patient been a risk to others within the distant past? No.   Alcohol Screening: 1. How often do you have a drink containing alcohol?: 4 or more times a week 2. How many drinks containing alcohol do you have on a typical day when you are drinking?: 10 or more 3. How often do you have six or more drinks on one occasion?: Daily or almost daily AUDIT-C Score: 12 4. How often during the last year have you found that you were not able to stop drinking once you had started?: Never 5. How often during the last year have you failed to do what was normally expected from you becasue of drinking?: Never 6. How often during the last year have you needed a first drink in the morning to get yourself going after a heavy drinking session?: Never 7. How often during the last year have you had a feeling of guilt of remorse after drinking?: Never 8. How often during the last year have you been unable to remember what happened the night before because you had been drinking?: Never 9. Have you or someone else been injured as a result of your drinking?: No 10. Has a relative or friend or a doctor or another health worker been concerned about your drinking or suggested you cut down?: No Alcohol Use Disorder Identification Test Final Score (AUDIT): 12 Alcohol Brief Interventions/Follow-up: Patient Refused Substance Abuse History in the last 12 months:  Yes.   Consequences of Substance Abuse:  Previous Psychotropic Medications: No  Psychological Evaluations: No  Past Medical History:  Past Medical History:  Diagnosis Date  . Alcoholic hepatitis   . Pneumonia   . TB (tuberculosis) ~ 2017   "took the RX and was healed"  (05/04/2017)    Past Surgical History:  Procedure Laterality Date  . OTHER SURGICAL HISTORY  2010   "birth control surgery; don't know what they did"   Family History:  Family History  Family history unknown: Yes    Tobacco Screening: Have you used any form of tobacco in the last 30 days? (Cigarettes, Smokeless Tobacco, Cigars, and/or Pipes): No Social History:  Social History   Substance and Sexual Activity  Alcohol Use Yes  . Alcohol/week: 21.0 standard drinks  . Types: 21 Cans of beer per week   Comment: 05/04/2017 "1 can, 3 times/day"     Social History   Substance and Sexual Activity  Drug Use Never    Additional Social History:      Pain Medications: none Prescriptions: none Over the Counter: none History of alcohol / drug use?: No history of alcohol / drug abuse                    Allergies:  No Known Allergies Lab Results:  Results for orders placed or performed during the hospital encounter of 03/24/18 (from the past 48  hour(s))  TSH     Status: Abnormal   Collection Time: 03/25/18  7:03 AM  Result Value Ref Range   TSH 7.041 (H) 0.350 - 4.500 uIU/mL    Comment: Performed by a 3rd Generation assay with a functional sensitivity of <=0.01 uIU/mL. Performed at [facility] [place] [address] [place], [place] [address]     Blood Alcohol level:  Lab Results  Component Value Date   ETH 41 (H) 03/23/2018   ETH 382 (HH) 74/25/9563    Metabolic Disorder Labs:  Lab Results  Component Value Date   HGBA1C 5.7 (H) 05/04/2017   MPG 116.89 05/04/2017   No results found for: PROLACTIN Lab Results  Component Value Date   CHOL 349 (H) 05/06/2017   TRIG 790 (H) 05/06/2017   HDL <10 (L) 05/06/2017   CHOLHDL NOT DONE 05/06/2017   VLDL UNABLE TO CALCULATE IF TRIGLYCERIDE OVER 400 mg/dL 05/06/2017   LDLCALC UNABLE TO CALCULATE IF TRIGLYCERIDE OVER 400 mg/dL 05/06/2017   LDLCALC UNABLE TO CALCULATE IF TRIGLYCERIDE OVER 400 mg/dL 05/04/2017     Current Medications: Current Facility-Administered Medications  Medication Dose Route Frequency Provider Last Rate Last Dose  . acetaminophen (TYLENOL) tablet 650 mg  650 mg Oral Q6H PRN Money, Lowry Ram, FNP      . alum & mag hydroxide-simeth (MAALOX/MYLANTA) 200-200-20 MG/5ML suspension 30 mL  30 mL Oral Q4H PRN Money, Lowry Ram, FNP      . FLUoxetine (PROZAC) capsule 20 mg  20 mg Oral Daily Johnn Hai, MD      . hydrOXYzine (ATARAX/VISTARIL) tablet 25 mg  25 mg Oral Q6H PRN Money, Lowry Ram, FNP   25 mg at 03/24/18 2104  . Influenza vac split quadrivalent PF (FLUARIX) injection 0.5 mL  0.5 mL Intramuscular Tomorrow-1000 Cobos, Fernando A, MD      . loperamide (IMODIUM) capsule 2-4 mg  2-4 mg Oral PRN Money, Lowry Ram, FNP      . LORazepam (ATIVAN) tablet 1 mg  1 mg Oral Q6H PRN Money, Lowry Ram, FNP      . magnesium hydroxide (MILK OF MAGNESIA) suspension 30 mL  30 mL Oral Daily PRN Money, Lowry Ram, FNP      . multivitamin with minerals tablet 1 tablet  1 tablet Oral Daily Money, Lowry Ram, FNP   1 tablet at 03/25/18 0820  . nicotine (NICODERM CQ - dosed in mg/24 hours) patch 21 mg  21 mg Transdermal Daily Cobos, Myer Peer, MD   21 mg at 03/25/18 0820  . ondansetron (ZOFRAN-ODT) disintegrating tablet 4 mg  4 mg Oral Q6H PRN Money, Lowry Ram, FNP      . prenatal multivitamin tablet 1 tablet  1 tablet Oral Q1200 Johnn Hai, MD      . thiamine (B-1) injection 100 mg  100 mg Intramuscular Once Money, Darnelle Maffucci B, FNP      . thiamine (VITAMIN B-1) tablet 100 mg  100 mg Oral Daily Money, Lowry Ram, FNP   100 mg at 03/25/18 0820  . traZODone (DESYREL) tablet 50 mg  50 mg Oral QHS PRN Money, Lowry Ram, FNP   50 mg at 03/24/18 2104   PTA Medications: Medications Prior to Admission  Medication Sig Dispense Refill Last Dose  . doxycycline (VIBRA-TABS) 100 MG tablet Take 1 tablet (100 mg total) by mouth 2 (two) times daily. (Patient not taking: Reported on 03/23/2018) 20 tablet 0 Unknown at Unknown time   . folic acid (FOLVITE) 1 MG tablet  Take 1 tablet (1 mg total) by mouth daily. (Patient not taking: Reported on 03/23/2018) 30 tablet 0 Unknown at Unknown time  . lactulose (CHRONULAC) 10 GM/15ML solution Take 45 mLs (30 g total) by mouth 2 (two) times daily. (Patient not taking: Reported on 03/23/2018) 240 mL 0 Unknown at Unknown time  . mupirocin ointment (BACTROBAN) 2 % Apply 1 application topically 3 (three) times daily. (Patient not taking: Reported on 03/23/2018) 22 g 1 Unknown at Unknown time  . thiamine 100 MG tablet Take 1 tablet (100 mg total) by mouth daily. (Patient not taking: Reported on 03/23/2018) 30 tablet 0 Unknown at Unknown time    Musculoskeletal: Strength & Muscle Tone: within normal limits Gait & Station: normal Patient leans: N/A  Psychiatric Specialty Exam: Physical Exam  ROS  Blood pressure 119/88, pulse 99, temperature 97.8 F (36.6 C), temperature source Oral, resp. rate 18, height '4\' 11"'$  (1.499 m), weight 45.8 kg.Body mass index is 20.4 kg/m.  General Appearance: Casual  Eye Contact:  Minimal  Speech:  Slow  Volume:  Decreased  Mood:  Anxious and Depressed  Affect:  Congruent  Thought Process:  Goal Directed  Orientation:  Full (Time, Place, and Person)  Thought Content:  Logical  Suicidal Thoughts:  No  Homicidal Thoughts:  No  Memory:  Immediate;   Poor  Judgement:  Fair  Insight:  Fair  Psychomotor Activity:  Decreased  Concentration:  Concentration: Fair  Recall:  [place] of Knowledge:  Fair  Language:  Fair  Akathisia:  Negative  Handed:  Right  AIMS (if indicated):     Assets:  Resilience Social Support  ADL's:  Intact  Cognition:  WNL  Sleep:  Number of Hours: 5.75    Treatment Plan Summary: Daily contact with patient to assess and evaluate symptoms and progress in treatment, Medication management and Plan Met for detox and for treatment of depression  Observation Level/Precautions:  15 minute checks  Laboratory:  UDS   Psychotherapy: Cognitive rehab based  Medications: Several adjustments  Consultations: None necessary  Discharge Concerns: Long-term safety  Estimated LOS: 5-7  Other:   Axis I depression recurrent severe without psychosis, alcohol dependence and withdrawal, alcoholic hepatitis on Axis III   Physician Treatment Plan for Primary Diagnosis: <principal problem not specified> Long Term Goal(s): Improvement in symptoms so as ready for discharge  Short Term Goals: Ability to maintain clinical measurements within normal limits will improve and Compliance with prescribed medications will improve  Physician Treatment Plan for Secondary Diagnosis: Active Problems:   MDD (major depressive disorder), severe ([place])  Long Term Goal(s): Improvement in symptoms so as ready for discharge  Short Term Goals: Compliance with prescribed medications will improve and Ability to identify triggers associated with substance abuse/mental health issues will improve  I certify that inpatient services furnished can reasonably be expected to improve the patient's condition.    Johnn Hai, MD 2/24/202010:27 AM

## 2018-03-25 NOTE — Progress Notes (Signed)
Recreation Therapy Notes  Date:  2. 24. 20 Time: 0930 Location: [address] Dayroom  Group Topic: Stress Management  Goal Area(s) Addresses:  Patient will identify positive stress management techniques. Patient will identify benefits of using stress management post d/c.  Intervention: Worksheet  Activity :  Choice Meditation.  LRT introduced the stress management technique of meditation.  LRT played a meditation that focused on having a choice in changing the way they think about things.  Patients were to follow along as meditation played to engage in activity.  Education:  Stress Management, Discharge Planning.   Education Outcome: Acknowledges Education  Clinical Observations/Feedback: Pt did not attend group.     Susan Mcconnell, LRT/CTRS         [name] 03/25/2018 10:56 AM 

## 2018-03-25 NOTE — Tx Team (Signed)
Interdisciplinary Treatment and Diagnostic Plan Update  03/25/2018 Time of Session: 9:00am Susan Mcconnell MRN: [id number]  Principal Diagnosis: <principal problem not specified>  Secondary Diagnoses: Active Problems:   MDD (major depressive disorder), severe (HCC)   Current Medications:  Current Facility-Administered Medications  Medication Dose Route Frequency Provider Last Rate Last Dose  . acetaminophen (TYLENOL) tablet 650 mg  650 mg Oral Q6H PRN Money, Lowry Ram, FNP      . alum & mag hydroxide-simeth (MAALOX/MYLANTA) 200-200-20 MG/5ML suspension 30 mL  30 mL Oral Q4H PRN Money, Lowry Ram, FNP      . FLUoxetine (PROZAC) capsule 20 mg  20 mg Oral Daily Johnn Hai, MD      . hydrOXYzine (ATARAX/VISTARIL) tablet 25 mg  25 mg Oral Q6H PRN Money, Lowry Ram, FNP   25 mg at 03/24/18 2104  . Influenza vac split quadrivalent PF (FLUARIX) injection 0.5 mL  0.5 mL Intramuscular Tomorrow-1000 Cobos, Fernando A, MD      . loperamide (IMODIUM) capsule 2-4 mg  2-4 mg Oral PRN Money, Lowry Ram, FNP      . LORazepam (ATIVAN) tablet 1 mg  1 mg Oral Q6H PRN Money, Lowry Ram, FNP      . magnesium hydroxide (MILK OF MAGNESIA) suspension 30 mL  30 mL Oral Daily PRN Money, Lowry Ram, FNP      . multivitamin with minerals tablet 1 tablet  1 tablet Oral Daily Money, Lowry Ram, FNP   1 tablet at 03/25/18 0820  . nicotine (NICODERM CQ - dosed in mg/24 hours) patch 21 mg  21 mg Transdermal Daily Cobos, Myer Peer, MD   21 mg at 03/25/18 0820  . ondansetron (ZOFRAN-ODT) disintegrating tablet 4 mg  4 mg Oral Q6H PRN Money, Lowry Ram, FNP      . prenatal multivitamin tablet 1 tablet  1 tablet Oral Q1200 Johnn Hai, MD      . thiamine (B-1) injection 100 mg  100 mg Intramuscular Once Money, Darnelle Maffucci B, FNP      . thiamine (VITAMIN B-1) tablet 100 mg  100 mg Oral Daily Money, Lowry Ram, FNP   100 mg at 03/25/18 0820  . traZODone (DESYREL) tablet 50 mg  50 mg Oral QHS PRN Money, Lowry Ram, FNP   50 mg at 03/24/18 2104   PTA  Medications: Medications Prior to Admission  Medication Sig Dispense Refill Last Dose  . doxycycline (VIBRA-TABS) 100 MG tablet Take 1 tablet (100 mg total) by mouth 2 (two) times daily. (Patient not taking: Reported on 03/23/2018) 20 tablet 0 Unknown at Unknown time  . folic acid (FOLVITE) 1 MG tablet Take 1 tablet (1 mg total) by mouth daily. (Patient not taking: Reported on 03/23/2018) 30 tablet 0 Unknown at Unknown time  . lactulose (CHRONULAC) 10 GM/15ML solution Take 45 mLs (30 g total) by mouth 2 (two) times daily. (Patient not taking: Reported on 03/23/2018) 240 mL 0 Unknown at Unknown time  . mupirocin ointment (BACTROBAN) 2 % Apply 1 application topically 3 (three) times daily. (Patient not taking: Reported on 03/23/2018) 22 g 1 Unknown at Unknown time  . thiamine 100 MG tablet Take 1 tablet (100 mg total) by mouth daily. (Patient not taking: Reported on 03/23/2018) 30 tablet 0 Unknown at Unknown time    Patient Stressors: Financial difficulties Substance abuse  Patient Strengths: Average or above average intelligence General fund of knowledge  Treatment Modalities: Medication Management, Group therapy, Case management,  1 to 1 session with clinician, Psychoeducation, Recreational  therapy.   Physician Treatment Plan for Primary Diagnosis: <principal problem not specified> Long Term Goal(s):     Short Term Goals:    Medication Management: Evaluate patient's response, side effects, and tolerance of medication regimen.  Therapeutic Interventions: 1 to 1 sessions, Unit Group sessions and Medication administration.  Evaluation of Outcomes: Not Met  Physician Treatment Plan for Secondary Diagnosis: Active Problems:   MDD (major depressive disorder), severe ([place])  Long Term Goal(s):     Short Term Goals:       Medication Management: Evaluate patient's response, side effects, and tolerance of medication regimen.  Therapeutic Interventions: 1 to 1 sessions, Unit Group sessions and  Medication administration.  Evaluation of Outcomes: Not Met   RN Treatment Plan for Primary Diagnosis: <principal problem not specified> Long Term Goal(s): Knowledge of disease and therapeutic regimen to maintain health will improve  Short Term Goals: Ability to demonstrate self-control and Ability to identify and develop effective coping behaviors will improve  Medication Management: RN will administer medications as ordered by provider, will assess and evaluate patient's response and provide education to patient for prescribed medication. RN will report any adverse and/or side effects to prescribing provider.  Therapeutic Interventions: 1 on 1 counseling sessions, Psychoeducation, Medication administration, Evaluate responses to treatment, Monitor vital signs and CBGs as ordered, Perform/monitor CIWA, COWS, AIMS and Fall Risk screenings as ordered, Perform wound care treatments as ordered.  Evaluation of Outcomes: Not Met   LCSW Treatment Plan for Primary Diagnosis: <principal problem not specified> Long Term Goal(s): Safe transition to appropriate next level of care at discharge, Engage patient in therapeutic group addressing interpersonal concerns.  Short Term Goals: Engage patient in aftercare planning with referrals and resources, Increase social support, Identify triggers associated with mental health/substance abuse issues and Increase skills for wellness and recovery  Therapeutic Interventions: Assess for all discharge needs, 1 to 1 time with Social worker, Explore available resources and support systems, Assess for adequacy in community support network, Educate family and significant other(s) on suicide prevention, Complete Psychosocial Assessment, Interpersonal group therapy.  Evaluation of Outcomes: Not Met   Progress in Treatment: Attending groups: No. Participating in groups: No. Taking medication as prescribed: Yes. Toleration medication: Yes. Family/Significant other  contact made: No, will contact:  supports if consents are granted Patient understands diagnosis: Yes. Discussing patient identified problems/goals with staff: Yes. Medical problems stabilized or resolved: No. Denies suicidal/homicidal ideation: No.Unable to assess, patient needs an interpreter Issues/concerns per patient self-inventory: No.  New problem(s) identified: No, Describe:  CSW continuing to assess  New Short Term/Long Term Goal(s): detox, medication management for mood stabilization; elimination of SI thoughts; development of comprehensive mental wellness/sobriety plan.  Patient Goals:  Unable to assess, patient needs an interpreter, interpreting services requested.   Discharge Plan or Barriers:  CSW continuing to assess. Page pamphlet, Mobile Crisis information, and AA/NA information provided to patient for additional community support and resources.   Reason for Continuation of Hospitalization: Anxiety Depression Withdrawal symptoms  Estimated Length of Stay: 3-5 days  Attendees: Patient: 03/25/2018 10:21 AM  Physician:  03/25/2018 10:21 AM  Nursing:  03/25/2018 10:21 AM  RN Care Manager: 03/25/2018 10:21 AM  Social Worker: Stephanie Acre, Latanya Presser 03/25/2018 10:21 AM  Recreational Therapist:  03/25/2018 10:21 AM  Other:  03/25/2018 10:21 AM  Other:  03/25/2018 10:21 AM  Other: 03/25/2018 10:21 AM    Scribe for Treatment Team: Joellen [place], [place] 03/25/2018 10:21 AM

## 2018-03-25 NOTE — Progress Notes (Signed)
Pt did not attend AA group this evening.  

## 2018-03-26 MED ORDER — IBUPROFEN 600 MG PO TABS
600.0000 mg | ORAL_TABLET | Freq: Four times a day (QID) | ORAL | Status: DC | PRN
  Administered 2018-03-26 – 2018-04-03 (×17): 600 mg via ORAL
  Filled 2018-03-26 (×2): qty 1
  Filled 2018-03-26: qty 10
  Filled 2018-03-26 (×15): qty 1

## 2018-03-26 NOTE — Progress Notes (Signed)
Pt attend wrap up group. 

## 2018-03-26 NOTE — Progress Notes (Signed)
[facility] MD Progress Note  03/26/2018 10:08 AM Susan Mcconnell  MRN:  [id number] Subjective:    Patient seen she continues to answer questions appropriately denies wanting to harm herself, denies withdrawal symptoms, believes medications are helpful but her answers are sparse so I am waiting for the translator this time again to get more detailed answers. As best I can tell she is reporting no suicidal thoughts and no cravings tremors or withdrawal symptoms no psychosis  Principal Problem: Alcoholism, depression and suicidality Diagnosis: Active Problems:   MDD (major depressive disorder), severe (HCC)  Total Time spent with patient: 30 minutes  Past Medical History:  Past Medical History:  Diagnosis Date  . Alcoholic hepatitis   . Pneumonia   . TB (tuberculosis) ~ 2017   "took the RX and was healed" (05/04/2017)    Past Surgical History:  Procedure Laterality Date  . OTHER SURGICAL HISTORY  2010   "birth control surgery; don't know what they did"   Family History:  Family History  Family history unknown: Yes   Family Psychiatric  History: neg Social History:  Social History   Substance and Sexual Activity  Alcohol Use Yes  . Alcohol/week: 21.0 standard drinks  . Types: 21 Cans of beer per week   Comment: 05/04/2017 "1 can, 3 times/day"     Social History   Substance and Sexual Activity  Drug Use Never    Social History   Socioeconomic History  . Marital status: Divorced    Spouse name: Not on file  . Number of children: Not on file  . Years of education: Not on file  . Highest education level: Not on file  Occupational History  . Not on file  Social Needs  . Financial resource strain: Not on file  . Food insecurity:    Worry: Not on file    Inability: Not on file  . Transportation needs:    Medical: Not on file    Non-medical: Not on file  Tobacco Use  . Smoking status: Never Smoker  . Smokeless tobacco: Current User    Types: Chew  Substance and Sexual  Activity  . Alcohol use: Yes    Alcohol/week: 21.0 standard drinks    Types: 21 Cans of beer per week    Comment: 05/04/2017 "1 can, 3 times/day"  . Drug use: Never  . Sexual activity: Not Currently  Lifestyle  . Physical activity:    Days per week: Not on file    Minutes per session: Not on file  . Stress: Not on file  Relationships  . Social connections:    Talks on phone: Not on file    Gets together: Not on file    Attends religious service: Not on file    Active member of club or organization: Not on file    Attends meetings of clubs or organizations: Not on file    Relationship status: Not on file  Other Topics Concern  . Not on file  Social History Narrative  . Not on file   Additional Social History:    Pain Medications: none Prescriptions: none Over the Counter: none History of alcohol / drug use?: No history of alcohol / drug abuse                    Sleep: Fair  Appetite:  Fair  Current Medications: Current Facility-Administered Medications  Medication Dose Route Frequency Provider Last Rate Last Dose  . alum & mag hydroxide-simeth (MAALOX/MYLANTA) 200-200-20 MG/5ML  suspension 30 mL  30 mL Oral Q4H PRN Money, Gerlene Burdock, FNP   30 mL at 03/25/18 2236  . chlordiazePOXIDE (LIBRIUM) capsule 25 mg  25 mg Oral TID Malvin Johns, MD   25 mg at 03/26/18 0758  . FLUoxetine (PROZAC) capsule 20 mg  20 mg Oral Daily Malvin Johns, MD   20 mg at 03/26/18 9295  . hydrOXYzine (ATARAX/VISTARIL) tablet 25 mg  25 mg Oral Q6H PRN Money, Gerlene Burdock, FNP   25 mg at 03/24/18 2104  . ibuprofen (ADVIL,MOTRIN) tablet 600 mg  600 mg Oral Q6H PRN Nira Conn A, NP   600 mg at 03/26/18 0756  . loperamide (IMODIUM) capsule 2-4 mg  2-4 mg Oral PRN Money, Gerlene Burdock, FNP      . LORazepam (ATIVAN) tablet 1 mg  1 mg Oral Q6H PRN Money, Gerlene Burdock, FNP      . magnesium hydroxide (MILK OF MAGNESIA) suspension 30 mL  30 mL Oral Daily PRN Money, Gerlene Burdock, FNP      . multivitamin with minerals  tablet 1 tablet  1 tablet Oral Daily Money, Gerlene Burdock, FNP   1 tablet at 03/26/18 0753  . nicotine (NICODERM CQ - dosed in mg/24 hours) patch 21 mg  21 mg Transdermal Daily Cobos, Rockey Situ, MD   21 mg at 03/26/18 0754  . ondansetron (ZOFRAN-ODT) disintegrating tablet 4 mg  4 mg Oral Q6H PRN Money, Gerlene Burdock, FNP      . prenatal multivitamin tablet 1 tablet  1 tablet Oral Daily Malvin Johns, MD   1 tablet at 03/26/18 0753  . thiamine (B-1) injection 100 mg  100 mg Intramuscular Once Money, Feliz Beam B, FNP      . thiamine (VITAMIN B-1) tablet 100 mg  100 mg Oral Daily Money, Gerlene Burdock, FNP   100 mg at 03/26/18 0755  . traZODone (DESYREL) tablet 100 mg  100 mg Oral QHS PRN Jackelyn Poling, NP   100 mg at 03/25/18 2236    Lab Results:  Results for orders placed or performed during the hospital encounter of 03/24/18 (from the past 48 hour(s))  TSH     Status: Abnormal   Collection Time: 03/25/18  7:03 AM  Result Value Ref Range   TSH 7.041 (H) 0.350 - 4.500 uIU/mL    Comment: Performed by a 3rd Generation assay with a functional sensitivity of <=0.01 uIU/mL. Performed at [facility], [address] [place], [place] [address]     Blood Alcohol level:  Lab Results  Component Value Date   ETH 41 (H) 03/23/2018   ETH 382 (HH) 02/03/2018    Metabolic Disorder Labs: Lab Results  Component Value Date   HGBA1C 5.7 (H) 05/04/2017   MPG 116.89 05/04/2017   No results found for: PROLACTIN Lab Results  Component Value Date   CHOL 349 (H) 05/06/2017   TRIG 790 (H) 05/06/2017   HDL <10 (L) 05/06/2017   CHOLHDL NOT DONE 05/06/2017   VLDL UNABLE TO CALCULATE IF TRIGLYCERIDE OVER 400 mg/dL 03/70/9643   LDLCALC UNABLE TO CALCULATE IF TRIGLYCERIDE OVER 400 mg/dL 83/81/8403   LDLCALC UNABLE TO CALCULATE IF TRIGLYCERIDE OVER 400 mg/dL 75/43/6067    Physical Findings: AIMS: Facial and Oral Movements Muscles of Facial Expression: None, normal Lips and Perioral Area: None,  normal Jaw: None, normal Tongue: None, normal,Extremity Movements Upper (arms, wrists, hands, fingers): None, normal Lower (legs, knees, ankles, toes): None, normal, Trunk Movements Neck, shoulders, hips: None, normal, Overall  Severity Severity of abnormal movements (highest score from questions above): None, normal Incapacitation due to abnormal movements: None, normal Patient's awareness of abnormal movements (rate only patient's report): No Awareness, Dental Status Current problems with teeth and/or dentures?: No Does patient usually wear dentures?: No  CIWA:  CIWA-Ar Total: 0 COWS:  COWS Total Score: 3  Musculoskeletal: Strength & Muscle Tone: within normal limits Gait & Station: normal Patient leans: N/A  Psychiatric Specialty Exam: Physical Exam  ROS  Blood pressure 110/65, pulse 84, temperature 98 F (36.7 C), temperature source Oral, resp. rate 18, height 4\' 11"  (1.499 m), weight 45.8 kg.Body mass index is 20.4 kg/m.  General Appearance: Casual  Eye Contact:  Good  Speech:  Slow  Volume:  Decreased  Mood:  Dysphoric  Affect:  Blunt  Thought Process:  Goal Directed  Orientation:  Full (Time, Place, and Person)  Thought Content:  Logical  Suicidal Thoughts:  No  Homicidal Thoughts:  No  Memory:  Immediate;   Fair  Judgement:  Fair  Insight:  Fair  Psychomotor Activity:  Decreased  Concentration:  Concentration: Fair  Recall:  [employer] of Knowledge:  Fair  Language:  Fair  Akathisia:  Negative  Handed:  Right  AIMS (if indicated):     Assets:  Communication Skills Desire for Improvement  ADL's:  Intact  Cognition:  WNL  Sleep:  Number of Hours: 6     Treatment Plan Summary: Daily contact with patient to assess and evaluate symptoms and progress in treatment, Medication management and Plan Continue detox measures continue antidepressant therapy discuss with team await translator for more in-depth interview but is best I can tell there is no acute  dangerousness today to self if stays here can contract here and as best I can tell understands what that means  Malvin Johns, MD 03/26/2018, 10:08 AM

## 2018-03-26 NOTE — [facility] Suicide Risk Assessment (Signed)
[facility] INPATIENT:  Family/Significant Other Suicide Prevention Education  Suicide Prevention Education:  Patient Refusal for Family/Significant Other Suicide Prevention Education: The patient Susan Mcconnell has refused to provide written consent for family/significant other to be provided Family/Significant Other Suicide Prevention Education during admission and/or prior to discharge.  Physician notified.   SPE completed with patient with interpreter present.   Darreld Mclean 03/26/2018, 12:03 PM

## 2018-03-26 NOTE — [facility] Group Notes (Signed)
[facility] Group Notes: Nursing Psychoeducation  Date:  03/26/2018  Time:  4:00 PM  Type of Therapy:  Psychoeducational Skills  Participation Level:  Active  Participation Quality:  Appropriate and Attentive  Affect:  Appropriate  Cognitive:  Alert and Oriented  Insight:  Appropriate and Improving  Engagement in Group:  Developing/Improving, Engaged and Supportive  Modes of Intervention:  Discussion, Reality Testing, Socialization and Support  Summary of Progress/Problems: Patient reports she is anxious about losing her children. Patient discussed the best and worse case scenarios of her anxiety and states steps she could take to prevent the worst case scenerio. Patient was supportive of peers and contributed positively to group.  Marchelle Folks A Dickey Caamano 03/26/2018, 5:00 PM

## 2018-03-26 NOTE — Plan of Care (Signed)
  Problem: Activity: Goal: Interest or engagement in leisure activities will improve Outcome: Not Progressing Goal: Imbalance in normal sleep/wake cycle will improve Outcome: Not Progressing

## 2018-03-26 NOTE — Progress Notes (Signed)
[facility] CPS worker Leigh Aurora [phone]) came to [facility] to meet with patient and CSW to schedule a Family meeting prior to discharge. There is a high likelihood of the patient's children being taken into DSS custody.  CPS reports there is no running water in the home and the patient's children have gone to her sister's home to take a bath. There is also no food in the home. CPS brought the oldest son a $71 [employer] giftcard last night (02/24).  A meeting with DSS worker Leigh Aurora, DSS supervisor Andreas Blower [phone]), and a potential foster care worker will take place at [facility] tomorrow (02/26) at 10am. Patient was encouraged to have a support present, such as her sister. CSW will confirm that interpreter services are available to patient for the meeting.  Patient added [facility] CPS to her ROIs. Interpreter present during interaction to explain the purpose of the meeting and to schedule the meeting.   Dr.Farah undated.   Enid Cutter, LCSW-A Clinical Social Worker

## 2018-03-26 NOTE — Progress Notes (Signed)
D:  Patient's self inventory sheet, patient has fair sleep, sleep medication helpful.   Good appetite, normal energy level, good concentration.  Denied depression.  Rated hopeless 4, anxiety 3.  Denied withdrawals.  Denied SI.  Denied physical problems.  Physical pain, worst pain #3 in past 24 hours, head.  Pain medication helpful.  Goal is to get better.  Plans to take medicine.  No discharge plans. A:  Medications administered per MD orders.  Emotional support and encouragement given patient. R:  Denied SI and HI, contracts for safety.  Denied A/V hallucinations.  Safety maintained with 15 minute checks. Patient has had interpreter with her most of the day.  Patient presently in group with interpreter.  Patient did talk to DSS worker with interpreter present.  DSS meeting over.

## 2018-03-26 NOTE — Progress Notes (Signed)
DSS representative, interpretor and patient are meeting to discuss patient's children.

## 2018-03-26 NOTE — [facility] Group Notes (Signed)
[facility] Mental Health Association Group Therapy 03/26/2018 3:34 PM  Type of Therapy: Mental Health Association Presentation  Participation Level: Active  Participation Quality: Attentive  Affect: Appropriate  Cognitive: Oriented  Insight: Developing/Improving  Engagement in Therapy: Engaged  Modes of Intervention: Discussion, Education and Socialization   Summary of Progress/Problems: Mental Health Association (MHA) Speaker came to talk about his personal journey with mental health. The pt processed ways by which to relate to the speaker. MHA speaker provided handouts and educational information pertaining to groups and services offered by the MHA. Pt was engaged in speaker's presentation and was receptive to resources provided.   Kanesha Cadle, MSW, LCSWA 03/26/2018 3:34 PM 

## 2018-03-26 NOTE — Progress Notes (Signed)
Recreation Therapy Notes  Animal-Assisted Activity (AAA) Program Checklist/Progress Notes Patient Eligibility Criteria Checklist & Daily Group note for Rec Tx Intervention  Date: 2.25.20 Time: 1430 Location: [address] Dayroom   AAA/T Program Assumption of Risk Form signed by Patient/ or Parent Legal Guardian  YES   Patient is free of allergies or sever asthma  YES   Patient reports no fear of animals  YES   Patient reports no history of cruelty to animals  YES   Patient understands his/her participation is voluntary  YES   Patient washes hands before animal contact  YES   Patient washes hands after animal contact  YES   Education: Hand Washing, Appropriate Animal Interaction   Education Outcome: Acknowledges understanding/In group clarification offered/Needs additional education.   Clinical Observations/Feedback:  Pt did not attend activity.    Leelynn Whetsel, LRT/CTRS         Shuree Brossart A 03/26/2018 3:15 PM 

## 2018-03-26 NOTE — Progress Notes (Addendum)
D:  Marwah remained in her room much of the evening.  She did not attend evening AA group.  Assessment was completed utilizing video interpreter.  She denied SI/HI or A/V hallucinations.  She reported her day was "ok."  No withdrawal symptoms voiced this evening.  She reported that she didn't sleep very well with the trazodone last night and is complaining of some chest pain in the epigastric region.  The pain worsens with movement.  Staff with Barbara Cower NP, plan to given Maalox, increase in trazodone and complete EKG. Magdaline took hs medications without difficulty and EKG was completed and placed on the front of the chart.  She is currently resting with her eyes closed and appears to be asleep.  A:  1:1 with RN for support and encouragement.  Medications as ordered.  Q 15 minute checks maintained for safety.  Encouraged participation in group and unit activities.   R:  Moxie remains safe on the unit.  We will continue to monitor the progress towards her goals.

## 2018-03-26 NOTE — Plan of Care (Signed)
Nurse talked with patient about her anxiety, depression, coping skills and her children's care while interpreter was present.

## 2018-03-26 NOTE — Progress Notes (Signed)
D: Pt was in dayroom upon initial approach.  Pt presents with depressed affect and mood.  Pt denies SI/HI, denies hallucinations, reports pain from headache of 3/10.  Pt has been visible in milieu with few interactions with peers, likely due to language barrier.  Pt attended evening group.    A: Introduced self to pt.  Met with pt 1:1.  Actively listened to pt and offered support and encouragement.  PO fluids encouraged and provided.  PRN medication administered for pain and sleep.  Q15 minute safety checks maintained.  R: Pt is safe on the unit.  Pt is compliant with medications.  Pt verbally contracts for safety.  Will continue to monitor and assess.
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
## 2018-03-26 NOTE — Progress Notes (Signed)
Pt was talking english  307.1 to [place]. Pt pretends she do not talk english. Pt said that's her friend and she wanted to talk to her english.

## 2018-03-27 MED ORDER — LOPERAMIDE HCL 2 MG PO CAPS
2.0000 mg | ORAL_CAPSULE | ORAL | Status: AC | PRN
  Administered 2018-03-27: 4 mg via ORAL
  Administered 2018-03-29: 2 mg via ORAL
  Filled 2018-03-27: qty 1

## 2018-03-27 NOTE — Progress Notes (Signed)
Recreation Therapy Notes  Date:  2.26.20 Time: 0930 Location: [address] Dayroom  Group Topic: Stress Management  Goal Area(s) Addresses:  Patient will identify positive stress management techniques. Patient will identify benefits of using stress management post d/c.  Behavioral Response:  Engaged  Intervention:  Stress Management    Activity :  Guided Imagery.  LRT introduced the stress management technique of guided imagery.  LRT read a script that focused on relaxing under the summer clouds.  Patients were to listen in order to engage as script was read.    Education:  Stress Management, Discharge Planning.   Education Outcome: Acknowledges Education  Clinical Observations/Feedback:  Pt attended and participated in group.     Jamespaul Secrist,  LRT/CTRS         Wendell Fiebig A 03/27/2018 11:29 AM 

## 2018-03-27 NOTE — Plan of Care (Addendum)
Patient was pleasant upon approach. Denies SI HI AVH. Complains of headache, Ibuprofen given for pain. Patient's interpreter is present, and was informed of meeting with DSS/CPS. Patient's 'case worker' also called and would like to be informed of any updates as well as when the patient discharges, so she can notify the family. Her name is in the chart Marlena Clipper).  Patient is compliant with medications prescribed per provider. No side effects noted. Safety is maintained with 15 minute checks as well as environmental checks. Will continue to monitor and provide support.  Problem: Education: Goal: Knowledge of the prescribed therapeutic regimen will improve Outcome: Progressing   Problem: Activity: Goal: Interest or engagement in leisure activities will improve Outcome: Progressing Goal: Imbalance in normal sleep/wake cycle will improve Outcome: Progressing   Problem: Coping: Goal: Coping ability will improve Outcome: Progressing

## 2018-03-27 NOTE — Progress Notes (Signed)
D: Pt was in dayroom upon initial approach.  Pt presents with depressed affect and mood.  She reports her day was "good."  Pt denies SI/HI, denies hallucinations, denies pain.  Pt has been visible in milieu with few peer interactions.  Pt attended evening group.  Pt complained of diarrhea.  A: Introduced self to pt.  Met with pt 1:1.  Actively listened to pt and offered support and encouragement.  PRN medication administered for loose stool and sleep.  PO fluids encouraged and provided.  Q15 minute safety checks maintained.  R: Pt is safe on the unit.  Pt is compliant with medications.  Pt verbally contracts for safety.  Will continue to monitor and assess.   
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
## 2018-03-27 NOTE — Progress Notes (Signed)
The patient was not able to rate her day but discussed that she dealt with a difficult phone call regarding her D.S.S. case. She is unclear about her discharge plans. Her goal for tomorrow is to go back to work.

## 2018-03-28 MED ORDER — MUSCLE RUB 10-15 % EX CREA
TOPICAL_CREAM | Freq: Three times a day (TID) | CUTANEOUS | Status: DC | PRN
  Administered 2018-03-28 – 2018-04-02 (×2): via TOPICAL
  Filled 2018-03-28: qty 85

## 2018-03-28 MED ORDER — TROLAMINE SALICYLATE 10 % EX CREA: TOPICAL_CREAM | Freq: Three times a day (TID) | CUTANEOUS | Status: DC | PRN

## 2018-03-28 NOTE — Progress Notes (Addendum)
With patient's written permission, CSW called [facility] Case Worker [name] [phone]). CSW shared the outcome of yesterday's DSS meeting with [name]. [name] reports she will visit the patient's sister in person today and update the sister, Sheral Flow.  CSW spoke with patient with [name] interpreter Arti present. CSW asked patient how she was feeling and processing DSS taking custody of her two youngest children. Patient presents with a flat affect and offers little information. She does say "if my children are taken away I'm just going to live here." CSW explained that patient may be hospitalized a couple more days to ensure her safe return to the community, but patient will need to plan on returning home soon. CSW encouraged the patient to call her sister and other supports.    Update 1:55pm CSW left voicemail for [facility] CPS worker, Leigh Aurora [phone]). CSW shared that is unlikely the patient will be discharged prior to tomorrow morning and requested that the DSS custody court date be continued from 02/28 to a later date, so that patient may be present.   Enid Cutter, LCSW-A Clinical Social Worker

## 2018-03-28 NOTE — Progress Notes (Signed)
[facility] MD Progress Note  03/28/2018 7:39 AM Susan Mcconnell  MRN:  [id number] Subjective:    Patient understands the outcome of yesterday's meeting, understands the custody issue as best I can tell however does not want to discuss it remains dysphoric with poor eye contact.  No acute withdrawal symptoms.  No thoughts of harming herself or harming others. No cravings tremors or acute withdrawal Understandably dysphoric but particularly withdrawn today not engaging in any cognitive-based therapy  Principal Problem: Alcoholism/depression/neglect of family Diagnosis: Active Problems:   MDD (major depressive disorder), severe (HCC)  Total Time spent with patient: 20 minutes  Past Medical History:  Past Medical History:  Diagnosis Date  . Alcoholic hepatitis   . Pneumonia   . TB (tuberculosis) ~ 2017   "took the RX and was healed" (05/04/2017)    Past Surgical History:  Procedure Laterality Date  . OTHER SURGICAL HISTORY  2010   "birth control surgery; don't know what they did"   Family History:  Family History  Family history unknown: Yes    Social History:  Social History   Substance and Sexual Activity  Alcohol Use Yes  . Alcohol/week: 21.0 standard drinks  . Types: 21 Cans of beer per week   Comment: 05/04/2017 "1 can, 3 times/day"     Social History   Substance and Sexual Activity  Drug Use Never    Social History   Socioeconomic History  . Marital status: Divorced    Spouse name: Not on file  . Number of children: Not on file  . Years of education: Not on file  . Highest education level: Not on file  Occupational History  . Not on file  Social Needs  . Financial resource strain: Not on file  . Food insecurity:    Worry: Not on file    Inability: Not on file  . Transportation needs:    Medical: Not on file    Non-medical: Not on file  Tobacco Use  . Smoking status: Never Smoker  . Smokeless tobacco: Current User    Types: Chew  Substance and Sexual Activity   . Alcohol use: Yes    Alcohol/week: 21.0 standard drinks    Types: 21 Cans of beer per week    Comment: 05/04/2017 "1 can, 3 times/day"  . Drug use: Never  . Sexual activity: Not Currently  Lifestyle  . Physical activity:    Days per week: Not on file    Minutes per session: Not on file  . Stress: Not on file  Relationships  . Social connections:    Talks on phone: Not on file    Gets together: Not on file    Attends religious service: Not on file    Active member of club or organization: Not on file    Attends meetings of clubs or organizations: Not on file    Relationship status: Not on file  Other Topics Concern  . Not on file  Social History Narrative  . Not on file   Additional Social History:    Pain Medications: none Prescriptions: none Over the Counter: none History of alcohol / drug use?: No history of alcohol / drug abuse                    Sleep: Fair  Appetite:  Fair  Current Medications: Current Facility-Administered Medications  Medication Dose Route Frequency Provider Last Rate Last Dose  . alum & mag hydroxide-simeth (MAALOX/MYLANTA) 200-200-20 MG/5ML suspension 30 mL  30 mL Oral Q4H PRN Money, Gerlene Burdock, FNP   30 mL at 03/25/18 2236  . FLUoxetine (PROZAC) capsule 20 mg  20 mg Oral Daily Malvin Johns, MD   20 mg at 03/27/18 0806  . ibuprofen (ADVIL,MOTRIN) tablet 600 mg  600 mg Oral Q6H PRN Nira Conn A, NP   600 mg at 03/27/18 1706  . loperamide (IMODIUM) capsule 2-4 mg  2-4 mg Oral PRN Donell Sievert E, PA-C   4 mg at 03/27/18 2105  . magnesium hydroxide (MILK OF MAGNESIA) suspension 30 mL  30 mL Oral Daily PRN Money, Feliz Beam B, FNP      . nicotine (NICODERM CQ - dosed in mg/24 hours) patch 21 mg  21 mg Transdermal Daily Cobos, Rockey Situ, MD   21 mg at 03/27/18 0806  . prenatal multivitamin tablet 1 tablet  1 tablet Oral Daily Malvin Johns, MD   1 tablet at 03/26/18 0753  . thiamine (B-1) injection 100 mg  100 mg Intramuscular Once Money,  Feliz Beam B, FNP      . thiamine (VITAMIN B-1) tablet 100 mg  100 mg Oral Daily Money, Gerlene Burdock, FNP   100 mg at 03/27/18 3716  . traZODone (DESYREL) tablet 100 mg  100 mg Oral QHS PRN Nira Conn A, NP   100 mg at 03/27/18 2102    Lab Results: No results found for this or any previous visit (from the past 48 hour(s)).  Blood Alcohol level:  Lab Results  Component Value Date   ETH 41 (H) 03/23/2018   ETH 382 (HH) 02/03/2018    Metabolic Disorder Labs: Lab Results  Component Value Date   HGBA1C 5.7 (H) 05/04/2017   MPG 116.89 05/04/2017   No results found for: PROLACTIN Lab Results  Component Value Date   CHOL 349 (H) 05/06/2017   TRIG 790 (H) 05/06/2017   HDL <10 (L) 05/06/2017   CHOLHDL NOT DONE 05/06/2017   VLDL UNABLE TO CALCULATE IF TRIGLYCERIDE OVER 400 mg/dL 96/78/9381   LDLCALC UNABLE TO CALCULATE IF TRIGLYCERIDE OVER 400 mg/dL 01/75/1025   LDLCALC UNABLE TO CALCULATE IF TRIGLYCERIDE OVER 400 mg/dL 85/27/7824    Physical Findings: AIMS: Facial and Oral Movements Muscles of Facial Expression: None, normal Lips and Perioral Area: None, normal Jaw: None, normal Tongue: None, normal,Extremity Movements Upper (arms, wrists, hands, fingers): None, normal Lower (legs, knees, ankles, toes): None, normal, Trunk Movements Neck, shoulders, hips: None, normal, Overall Severity Severity of abnormal movements (highest score from questions above): None, normal Incapacitation due to abnormal movements: None, normal Patient's awareness of abnormal movements (rate only patient's report): No Awareness, Dental Status Current problems with teeth and/or dentures?: No Does patient usually wear dentures?: No  CIWA:  CIWA-Ar Total: 1 COWS:  COWS Total Score: 1  Musculoskeletal: Strength & Muscle Tone: within normal limits Gait & Station: normal Patient leans: N/A  Psychiatric Specialty Exam: Physical Exam  ROS  Blood pressure (!) 84/55, pulse 86, temperature 97.7 F (36.5 C),  temperature source Oral, resp. rate 20, height 4\' 11"  (1.499 m), weight 45.8 kg, SpO2 99 %.Body mass index is 20.4 kg/m.  General Appearance: Fairly Groomed  Eye Contact:  Poor  Speech:  Slow  Volume:  Decreased  Mood:  Depressed  Affect:  Blunt and Flat  Thought Process:  Coherent  Orientation:  Full (Time, Place, and Person)  Thought Content:  Rumination  Suicidal Thoughts:  No  Homicidal Thoughts:  No  Memory:  Immediate;   Fair  Judgement:  Fair  Insight:  Fair  Psychomotor Activity:  Decreased  Concentration:  Concentration: Fair  Recall:  [employer] of Knowledge:  Fair  Language:  Fair  Akathisia:  Negative  Handed:  Right  AIMS (if indicated):     Assets: General health  ADL's:  Intact  Cognition:  WNL  Sleep:  Number of Hours: 6.25     Treatment Plan Summary: Daily contact with patient to assess and evaluate symptoms and progress in treatment, Medication management and Plan Again, still depressed so we will continue antidepressant therapy cognitive therapy attempted, also continue to conceptualize her case in the context of some component of 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  Malvin Johns, MD 03/28/2018, 7:39 AM

## 2018-03-28 NOTE — Progress Notes (Signed)
The patient could not come up with a positive event for her day. She did however mention something with regards to her case with D.S.S. Her goal for tomorrow is to speak with the doctor and social worker about her discharge plans.

## 2018-03-28 NOTE — Progress Notes (Signed)
D:  Patient's self inventory sheet, patient sleeps good, sleep medication helpful.  Fair appetite, normal energy level, good concentration.  Rated depression 5, hopeless and anxiety 10.  Withdrawals, diarrhea, chilling.  Denied SI.  Checked dizziness.  Physical pain, #3 in past 24 hours, neck back.  No pain medicine.  Goal is "not take kids away".  Plans to "stop drinking, find job".   No discharge plans. A:  Medications administered per MD orders.  Emotional support and encouragement given patient. R:  Denied SI and HI, contracts for safety.  Denied A/V hallucinations.  Safety maintained with 15 minute checks.Marland Kitchen

## 2018-03-28 NOTE — B[facility] Group Notes (Signed)
Date/Time 03/28/2018 1:15 PM  Type of Therapy/Topic:  Group Therapy:  Feelings about Diagnosis  Participation Level:  Active   Mood: Depressed  Description of Group:    This group will allow patients to explore their thoughts and feelings about diagnoses they have received. Patients will be guided to explore their level of understanding and acceptance of these diagnoses. Facilitator will encourage patients to process their thoughts and feelings about the reactions of others to their diagnosis, and will guide patients in identifying ways to discuss their diagnosis with significant others in their lives. This group will be process-oriented, with patients participating in exploration of their own experiences as well as giving and receiving support and challenge from other group members.   Therapeutic Goals: 1. Patient will demonstrate understanding of diagnosis as evidence by identifying two or more symptoms of the disorder:  2. Patient will be able to express two feelings regarding the diagnosis 3. Patient will demonstrate ability to communicate their needs through discussion and/or role plays  Summary of Patient Progress: Susan Mcconnell attended the entire session. She stated she understands "that drinking is bad and I need to stop". [name] also understands that this is a disease. She stated that she wants to be better for her kids.    Therapeutic Modalities:   Cognitive Behavioral Therapy Brief Therapy Feelings Identification   Marian Sorrow, MSW Intern 03/28/2018 1:15 PM

## 2018-03-28 NOTE — Plan of Care (Signed)
Nurse discussed anxiety, depression, coping skills with patient. 

## 2018-03-29 MED ORDER — PRENATAL MULTIVITAMIN CH: 1.0000 | ORAL_TABLET | Freq: Every day | ORAL | Status: DC

## 2018-03-29 MED ORDER — NICOTINE 21 MG/24HR TD PT24: 21.0000 mg | MEDICATED_PATCH | Freq: Every day | TRANSDERMAL | 0 refills | Status: AC

## 2018-03-29 MED ORDER — FLUOXETINE HCL 20 MG PO CAPS: 20.0000 mg | ORAL_CAPSULE | Freq: Every day | ORAL | 0 refills | Status: DC

## 2018-03-29 MED ORDER — IBUPROFEN 600 MG PO TABS: 600.0000 mg | ORAL_TABLET | Freq: Four times a day (QID) | ORAL | 0 refills | Status: DC | PRN

## 2018-03-29 MED ORDER — TRAZODONE HCL 100 MG PO TABS: 100.0000 mg | ORAL_TABLET | Freq: Every evening | ORAL | 0 refills | Status: DC | PRN
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
## 2018-03-29 NOTE — Tx Team (Signed)
Interdisciplinary Treatment and Diagnostic Plan Update  03/29/2018 Time of Session: 9:00am Susan Mcconnell MRN: [id number]  Principal Diagnosis: <principal problem not specified>  Secondary Diagnoses: Active Problems:   MDD (major depressive disorder), severe (HCC)   Current Medications:  Current Facility-Administered Medications  Medication Dose Route Frequency Provider Last Rate Last Dose  . alum & mag hydroxide-simeth (MAALOX/MYLANTA) 200-200-20 MG/5ML suspension 30 mL  30 mL Oral Q4H PRN Money, Gerlene Burdock, FNP   30 mL at 03/25/18 2236  . FLUoxetine (PROZAC) capsule 20 mg  20 mg Oral Daily Malvin Johns, MD   20 mg at 03/29/18 0946  . ibuprofen (ADVIL,MOTRIN) tablet 600 mg  600 mg Oral Q6H PRN Nira Conn A, NP   600 mg at 03/28/18 2157  . loperamide (IMODIUM) capsule 2-4 mg  2-4 mg Oral PRN Donell Sievert E, PA-C   4 mg at 03/27/18 2105  . magnesium hydroxide (MILK OF MAGNESIA) suspension 30 mL  30 mL Oral Daily PRN Money, Gerlene Burdock, FNP      . MUSCLE RUB CREA   Topical TID PRN Cobos, Rockey Situ, MD      . nicotine (NICODERM CQ - dosed in mg/24 hours) patch 21 mg  21 mg Transdermal Daily Cobos, Rockey Situ, MD   21 mg at 03/28/18 4098  . prenatal multivitamin tablet 1 tablet  1 tablet Oral Daily Malvin Johns, MD   1 tablet at 03/29/18 [phone]  . thiamine (B-1) injection 100 mg  100 mg Intramuscular Once Money, Feliz Beam B, FNP      . thiamine (VITAMIN B-1) tablet 100 mg  100 mg Oral Daily Money, Gerlene Burdock, FNP   100 mg at 03/29/18 0946  . traZODone (DESYREL) tablet 100 mg  100 mg Oral QHS PRN Nira Conn A, NP   100 mg at 03/28/18 2156   PTA Medications: Medications Prior to Admission  Medication Sig Dispense Refill Last Dose  . doxycycline (VIBRA-TABS) 100 MG tablet Take 1 tablet (100 mg total) by mouth 2 (two) times daily. (Patient not taking: Reported on 03/23/2018) 20 tablet 0 Unknown at Unknown time  . folic acid (FOLVITE) 1 MG tablet Take 1 tablet (1 mg total) by mouth daily. (Patient not  taking: Reported on 03/23/2018) 30 tablet 0 Unknown at Unknown time  . lactulose (CHRONULAC) 10 GM/15ML solution Take 45 mLs (30 g total) by mouth 2 (two) times daily. (Patient not taking: Reported on 03/23/2018) 240 mL 0 Unknown at Unknown time  . mupirocin ointment (BACTROBAN) 2 % Apply 1 application topically 3 (three) times daily. (Patient not taking: Reported on 03/23/2018) 22 g 1 Unknown at Unknown time  . thiamine 100 MG tablet Take 1 tablet (100 mg total) by mouth daily. (Patient not taking: Reported on 03/23/2018) 30 tablet 0 Unknown at Unknown time    Patient Stressors: Financial difficulties Substance abuse  Patient Strengths: Average or above average intelligence General fund of knowledge  Treatment Modalities: Medication Management, Group therapy, Case management,  1 to 1 session with clinician, Psychoeducation, Recreational therapy.   Physician Treatment Plan for Primary Diagnosis: <principal problem not specified> Long Term Goal(s): Improvement in symptoms so as ready for discharge Improvement in symptoms so as ready for discharge   Short Term Goals: Ability to maintain clinical measurements within normal limits will improve Compliance with prescribed medications will improve Compliance with prescribed medications will improve Ability to identify triggers associated with substance abuse/mental health issues will improve  Medication Management: Evaluate patient's response, side effects, and tolerance of  medication regimen.  Therapeutic Interventions: 1 to 1 sessions, Unit Group sessions and Medication administration.  Evaluation of Outcomes: Progressing  Physician Treatment Plan for Secondary Diagnosis: Active Problems:   MDD (major depressive disorder), severe (HCC)  Long Term Goal(s): Improvement in symptoms so as ready for discharge Improvement in symptoms so as ready for discharge   Short Term Goals: Ability to maintain clinical measurements within normal limits  will improve Compliance with prescribed medications will improve Compliance with prescribed medications will improve Ability to identify triggers associated with substance abuse/mental health issues will improve     Medication Management: Evaluate patient's response, side effects, and tolerance of medication regimen.  Therapeutic Interventions: 1 to 1 sessions, Unit Group sessions and Medication administration.  Evaluation of Outcomes: Progressing   RN Treatment Plan for Primary Diagnosis: <principal problem not specified> Long Term Goal(s): Knowledge of disease and therapeutic regimen to maintain health will improve  Short Term Goals: Ability to demonstrate self-control and Ability to identify and develop effective coping behaviors will improve  Medication Management: RN will administer medications as ordered by provider, will assess and evaluate patient's response and provide education to patient for prescribed medication. RN will report any adverse and/or side effects to prescribing provider.  Therapeutic Interventions: 1 on 1 counseling sessions, Psychoeducation, Medication administration, Evaluate responses to treatment, Monitor vital signs and CBGs as ordered, Perform/monitor CIWA, COWS, AIMS and Fall Risk screenings as ordered, Perform wound care treatments as ordered.  Evaluation of Outcomes: Progressing   LCSW Treatment Plan for Primary Diagnosis: <principal problem not specified> Long Term Goal(s): Safe transition to appropriate next level of care at discharge, Engage patient in therapeutic group addressing interpersonal concerns.  Short Term Goals: Engage patient in aftercare planning with referrals and resources, Increase social support, Identify triggers associated with mental health/substance abuse issues and Increase skills for wellness and recovery  Therapeutic Interventions: Assess for all discharge needs, 1 to 1 time with Social worker, Explore available resources and  support systems, Assess for adequacy in community support network, Educate family and significant other(s) on suicide prevention, Complete Psychosocial Assessment, Interpersonal group therapy.  Evaluation of Outcomes: Progressing   Progress in Treatment: Attending groups: Yes. Participating in groups: Yes. Taking medication as prescribed: Yes. Toleration medication: Yes. Family/Significant other contact made: Yes, individual(s) contacted:  spoke with [facility] Case Worker Natasha Patient understands diagnosis: Yes. Discussing patient identified problems/goals with staff: Yes. Medical problems stabilized or resolved: No. Denies suicidal/homicidal ideation: NO Issues/concerns per patient self-inventory: No.  New problem(s) identified: Yes, Describe:  Patient's two minor children were removed from her custody by [facility] DSS on Wednesday. She apparently did not understand the process discussed in the meeting on 03/27/18. Patient expressed suicidal ideation without plan to her Case Worker Natasha on 02/27. Patient has no social supports, is not fluent in [place], and plans to return to her home without utilities at discharge.  New Short Term/Long Term Goal(s): detox, medication management for mood stabilization; elimination of SI thoughts; development of comprehensive mental wellness/sobriety plan.  Patient Goals: Wants to go home, work a job, and get her babies back  Discharge Plan or Barriers:  CSW continuing to assess. [facility].   Reason for Continuation of Hospitalization: Anxiety Depression Suicidal ideation  Estimated Length of Stay: Monday, 03/02  Attendees: Patient: Susan Mcconnell 03/29/2018 10:38 AM  Physician:  03/29/2018 10:38 AM  Nursing:  03/29/2018 10:38 AM  RN Care Manager: 03/29/2018 10:38 AM  Social Worker:  Enid Cutter, [place] 03/29/2018 10:38 AM  Recreational Therapist:  03/29/2018 10:38 AM  Other: Interpreter, Arti  03/29/2018 10:38 AM  Other:  03/29/2018 10:38 AM  Other: 03/29/2018 10:38 AM    Scribe for Treatment Team: Darreld Mclean, LCSWA 03/29/2018 10:38 AM

## 2018-03-29 NOTE — Progress Notes (Signed)
Patient has been inquiring about discharge and is eager to return home and start her new job or look for new employment. She is denying SI/HI/AVH on unit with CSW, psychiatry, and nursing.   CSW made aware from nursing that patient reportedly told her [facility] Case Worker "[name]" that she was planning to kill herself if CPS took her children.   CSW addressed this with patient with the assistance of [name] interpreter. It became apparent that the patient did not fully understand the Child and Family Team meeting with CPS on 02/26. CSW spoke with patient and interpreter for approximately one hour and explained that the children were taken into temporary [facility] DSS custody on 02/26 and the children are in foster care. Attempted to explain foster care to patient and reassured her that foster care parents are selected by DSS to make sure there is a safe home with adequate food and supervision. Patient asked several times about being able to see her children, if she can start a job today and get them back tomorrow, etc. CSW explained that CPS determines the stipulations and time frame for the return of the children, but it is a lengthy process and make take several months.  Patient still denying SI/HI. She has not spoken to her sister to update her, CSW asked patient to try and reach out to her sister and any supports she may have.  Psychiatry updated; patient expected to discharge Monday. CSW requested interpreting services throughout the weekend.   Enid Cutter, LCSW-A Clinical Social Worker
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
## 2018-03-29 NOTE — Progress Notes (Signed)
D: Pt denies SI/HI/AVH. Pt is pleasant and cooperative. Pt in dayroom much of the evening, pt interacting with peers / staff minimally due to language barrier.   A: Pt was offered support and encouragement. Pt was given scheduled medications. Pt was encourage to attend groups. Q 15 minute checks were done for safety.   R:Pt attends groups and interacts well with peers and staff. Pt is taking medication. Pt has no complaints.Pt receptive to treatment and safety maintained on unit.   Problem: Activity: Goal: Interest or engagement in leisure activities will improve Outcome: Progressing   Problem: Coping: Goal: Coping ability will improve Outcome: Progressing   Problem: Coping: Goal: Will verbalize feelings Outcome: Progressing

## 2018-03-29 NOTE — Progress Notes (Signed)
Recreation Therapy Notes  Date:  2.28.20 Time: 0930 Location: [address] Hall Dayroom  Group Topic: Stress Management  Goal Area(s) Addresses:  Patient will identify positive stress management techniques. Patient will identify benefits of using stress management post d/c.  Intervention: Stress Management  Activity :  Progressive Muscle Relaxation.  LRT introduced the stress management technique of stress management.  LRT read a script that focused on tensing and relaxing each muscle group individually.  Patients were to follow along as the script was read to engage in activity.  Education:  Stress Management, Discharge Planning.   Education Outcome: Acknowledges Education  Clinical Observations/Feedback:  Pt did not attend group.    Sarann Tregre, LRT/CTRS        Tanique Matney A 03/29/2018 10:52 AM 

## 2018-03-29 NOTE — BHH Group Notes (Signed)
LCSW Group Therapy Note  03/29/2018 3:59 PM  Type of Therapy and Topic: Group Therapy: Avoiding Self-Sabotaging and Enabling Behaviors  Participation Level: Minimal  Description of Group:  In this group, patients will learn how to identify obstacles, self-sabotaging and enabling behaviors, as well as: what are they, why do we do them and what needs these behaviors meet. Discuss unhealthy relationships and how to have positive healthy boundaries with those that sabotage and enable. Explore aspects of self-sabotage and enabling in yourself and how to limit these self-destructive behaviors in everyday life.  Therapeutic Goals: 1. Patient will identify one obstacle that relates to self-sabotage and enabling behaviors 2. Patient will identify one personal self-sabotaging or enabling behavior they did prior to admission 3. Patient will state a plan to change the above identified behavior 4. Patient will demonstrate ability to communicate their needs through discussion and/or role play.   Summary of Patient Progress:  Patient sat quietly and attempted to follow along in group; however, her interpreter was not present making participation difficult.    Therapeutic Modalities:  Cognitive Behavioral Therapy Person-Centered Therapy Motivational Interviewing  Enid Cutter, MSW, [employer] Clinical Social Worker

## 2018-03-29 NOTE — Plan of Care (Signed)
D: Pt denies SI/HI/AVH. Pt is pleasant and cooperative. Pt visible in dayroom, pt focused on medications.   A: Pt was offered support and encouragement. Pt was given scheduled medications. Pt was encourage to attend groups. Q 15 minute checks were done for safety.   R:Pt attends groups and interacts well with peers and staff. Pt is taking medication. Pt has no complaints.Pt receptive to treatment and safety maintained on unit.   Problem: Activity: Goal: Interest or engagement in leisure activities will improve Outcome: Progressing    Problem: Activity: Goal: Imbalance in normal sleep/wake cycle will improve Outcome: Progressing

## 2018-03-30 NOTE — Progress Notes (Signed)
D: Pt denies SI/HI/AVH. Pt is pleasant and cooperative. Pt visible in dayroom this evening.   A: Pt was offered support and encouragement. Pt was given scheduled medications. Pt was encourage to attend groups. Q 15 minute checks were done for safety.   R:Pt attends groups and interacts well with peers and staff. Pt is taking medication. Pt has no complaints.Pt receptive to treatment and safety maintained on unit.

## 2018-03-30 NOTE — Progress Notes (Addendum)
[facility] MD Progress Note  03/30/2018 11:50 AM Susan Mcconnell  MRN:  [id number]   Subjective: As reported via [name] translator " I do not have any one or anything to go home too."  Evaluation: Patient observed sitting in day room interacting with translator.  Patient reports depression related to her children being taking.  Rates her children at 41 year old and 44 y.o and 41 years old.  Was reported she was scheduled for job orientation the day of her admission.  Patient continues to have questions related to DSS and or FBI?  Patient encouraged to follow-up with social worker.   Patient denies alcohol cravings for detox withdrawal symptoms.  Reports taking Prozac as prescribed and tolerating it well.  Reports poor appetite (cultural) NP requests rice reordered for patient for lunch and dinner.  Patient may have family bring food in.  Reports she is resting well throughout the night.  Support encouragement reassurance was provided.  Principal Problem: Risk of self-harm in the context of multiple diagnoses Diagnosis: Active Problems:   MDD (major depressive disorder), severe (HCC)  Total Time spent with patient: 45 minutes   Past Medical History:  Past Medical History:  Diagnosis Date  . Alcoholic hepatitis   . Pneumonia   . TB (tuberculosis) ~ 2017   "took the RX and was healed" (05/04/2017)    Past Surgical History:  Procedure Laterality Date  . OTHER SURGICAL HISTORY  2010   "birth control surgery; don't know what they did"   Family History:  Family History  Family history unknown: Yes   Family Psychiatric  History: neg Social History:  Social History   Substance and Sexual Activity  Alcohol Use Yes  . Alcohol/week: 21.0 standard drinks  . Types: 21 Cans of beer per week   Comment: 05/04/2017 "1 can, 3 times/day"     Social History   Substance and Sexual Activity  Drug Use Never    Social History   Socioeconomic History  . Marital status: Divorced    Spouse name: Not on  file  . Number of children: Not on file  . Years of education: Not on file  . Highest education level: Not on file  Occupational History  . Not on file  Social Needs  . Financial resource strain: Not on file  . Food insecurity:    Worry: Not on file    Inability: Not on file  . Transportation needs:    Medical: Not on file    Non-medical: Not on file  Tobacco Use  . Smoking status: Never Smoker  . Smokeless tobacco: Current User    Types: Chew  Substance and Sexual Activity  . Alcohol use: Yes    Alcohol/week: 21.0 standard drinks    Types: 21 Cans of beer per week    Comment: 05/04/2017 "1 can, 3 times/day"  . Drug use: Never  . Sexual activity: Not Currently  Lifestyle  . Physical activity:    Days per week: Not on file    Minutes per session: Not on file  . Stress: Not on file  Relationships  . Social connections:    Talks on phone: Not on file    Gets together: Not on file    Attends religious service: Not on file    Active member of club or organization: Not on file    Attends meetings of clubs or organizations: Not on file    Relationship status: Not on file  Other Topics Concern  . Not  on file  Social History Narrative  . Not on file   Additional Social History:    Pain Medications: none Prescriptions: none Over the Counter: none History of alcohol / drug use?: No history of alcohol / drug abuse                    Sleep: Good  Appetite:  Good  Current Medications: Current Facility-Administered Medications  Medication Dose Route Frequency Provider Last Rate Last Dose  . alum & mag hydroxide-simeth (MAALOX/MYLANTA) 200-200-20 MG/5ML suspension 30 mL  30 mL Oral Q4H PRN Money, Gerlene Burdock, FNP   30 mL at 03/25/18 2236  . FLUoxetine (PROZAC) capsule 20 mg  20 mg Oral Daily Malvin Johns, MD   20 mg at 03/30/18 0912  . ibuprofen (ADVIL,MOTRIN) tablet 600 mg  600 mg Oral Q6H PRN Nira Conn A, NP   600 mg at 03/29/18 2132  . loperamide (IMODIUM)  capsule 2-4 mg  2-4 mg Oral PRN Donell Sievert E, PA-C   2 mg at 03/29/18 2133  . magnesium hydroxide (MILK OF MAGNESIA) suspension 30 mL  30 mL Oral Daily PRN Money, Gerlene Burdock, FNP      . MUSCLE RUB CREA   Topical TID PRN Cobos, Rockey Situ, MD      . nicotine (NICODERM CQ - dosed in mg/24 hours) patch 21 mg  21 mg Transdermal Daily Cobos, Rockey Situ, MD   21 mg at 03/30/18 0917  . prenatal multivitamin tablet 1 tablet  1 tablet Oral Daily Malvin Johns, MD   1 tablet at 03/30/18 0912  . thiamine (B-1) injection 100 mg  100 mg Intramuscular Once Money, Feliz Beam B, FNP      . thiamine (VITAMIN B-1) tablet 100 mg  100 mg Oral Daily Money, Gerlene Burdock, FNP   100 mg at 03/30/18 0912  . traZODone (DESYREL) tablet 100 mg  100 mg Oral QHS PRN Nira Conn A, NP   100 mg at 03/29/18 2132    Lab Results: No results found for this or any previous visit (from the past 48 hour(s)).  Blood Alcohol level:  Lab Results  Component Value Date   ETH 41 (H) 03/23/2018   ETH 382 (HH) 02/03/2018    Metabolic Disorder Labs: Lab Results  Component Value Date   HGBA1C 5.7 (H) 05/04/2017   MPG 116.89 05/04/2017   No results found for: PROLACTIN Lab Results  Component Value Date   CHOL 349 (H) 05/06/2017   TRIG 790 (H) 05/06/2017   HDL <10 (L) 05/06/2017   CHOLHDL NOT DONE 05/06/2017   VLDL UNABLE TO CALCULATE IF TRIGLYCERIDE OVER 400 mg/dL 44/01/270   LDLCALC UNABLE TO CALCULATE IF TRIGLYCERIDE OVER 400 mg/dL 53/66/4403   LDLCALC UNABLE TO CALCULATE IF TRIGLYCERIDE OVER 400 mg/dL 47/42/5956    Physical Findings: AIMS: Facial and Oral Movements Muscles of Facial Expression: None, normal Lips and Perioral Area: None, normal Jaw: None, normal Tongue: None, normal,Extremity Movements Upper (arms, wrists, hands, fingers): None, normal Lower (legs, knees, ankles, toes): None, normal, Trunk Movements Neck, shoulders, hips: None, normal, Overall Severity Severity of abnormal movements (highest score from  questions above): None, normal Incapacitation due to abnormal movements: None, normal Patient's awareness of abnormal movements (rate only patient's report): No Awareness, Dental Status Current problems with teeth and/or dentures?: No Does patient usually wear dentures?: No  CIWA:  CIWA-Ar Total: 1 COWS:  COWS Total Score: 1  Musculoskeletal: Strength & Muscle Tone: within normal limits Gait &  Station: normal Patient leans: N/A  Psychiatric Specialty Exam: Physical Exam  Constitutional: She appears well-developed.    Review of Systems  Psychiatric/Behavioral: Positive for depression. Negative for suicidal ideas. The patient is nervous/anxious. The patient does not have insomnia.   All other systems reviewed and are negative.   Blood pressure 96/61, pulse 76, temperature 97.8 F (36.6 C), temperature source Oral, resp. rate 16, height  (1.499 m), weight 45.8 kg, SpO2 99 %.Body mass index is 20.4 kg/m.  General Appearance: Casual  Eye Contact:  Fair  Speech:  Clear and Coherent  Volume:  Decreased  Mood:  Anxious and Depressed  Affect:  Appropriate and Congruent  Thought Process:  Goal Directed  Orientation:  Full (Time, Place, and Person)  Thought Content:  Tangential  Suicidal Thoughts:  No  Homicidal Thoughts:  No  Memory:  Immediate;   Fair  Judgement:  Fair  Insight:  Fair  Psychomotor Activity:  Decreased  Concentration:  Concentration: Fair  Recall:  [employer] of Knowledge:  Fair  Language:  Fair  Akathisia:  Negative  Handed:  Right  AIMS (if indicated):     Assets:  Leisure Time Resilience  ADL's:  Intact  Cognition:  WNL  Sleep:  Number of Hours: 6.75     Treatment Plan Summary: Daily contact with patient to assess and evaluate symptoms and progress in treatment and Medication management   Continue with current treatment plan on 03/30/2018 as listed below except were noted  Major depressive disorder:  Continue Prozac 20 mg p.o.  daily Continue trazodone 100 mg p.o. nightly   Nursing  Care order: family may bring outside food. - NP requested rice for lunch and dinner from the cafeteria CSW to continue working on discharge disposition Patient encouraged to participate within the therapeutic milieu   Oneta Rack, NP 03/30/2018, 11:50 AM

## 2018-03-30 NOTE — Progress Notes (Signed)
Adult Psychoeducational Group Note  Date:  03/30/2018 Time:  9:37 PM  Group Topic/Focus:  Wrap-Up Group:   The focus of this group is to help patients review their daily goal of treatment and discuss progress on daily workbooks.  Participation Level:  Active  Participation Quality:  Appropriate  Affect:  Appropriate  Cognitive:  Appropriate  Insight: Appropriate  Engagement in Group:  Engaged  Modes of Intervention:  Discussion  Additional Comments:  Pt stated she is sad because her children have been removed from her custody by DSS.  Pt stated she had a bad day after speaking with the DSS worker.  Kristine Linea 03/30/2018, 9:37 PM

## 2018-03-30 NOTE — Progress Notes (Signed)
D Pt is seen OOB [facility] on the 300 hall today. Her interpreter is at her side.Francis Dowse takes her scheduled meds as ordered. She is flat, dperessed, does not intermingle with other patients and /. Or staff, but seen talking with interpreter.     A SHe answered the questions on her daily assessment , denying SI today and rating her depression, hopelessness and anxiety " 0/3/0", respectively. Writer unable to ascertain if pt actually understands her children are not in her home. Cont to support.     R Safety in place.

## 2018-03-30 NOTE — BHH Counselor (Signed)
Clinical Social Work Note  Pt's nurse reported getting a phone call from sister [name] [phone] stating that family has secured patient a bed "at [facility]" for when she discharges from this hospital.  CSW called to ask for details to assist with discharge planning.  There was no answer at this time, so HIPAA-compliant voicemail was left.  Ambrose Mantle, LCSW 03/30/2018, 3:38 PM

## 2018-03-30 NOTE — BHH Group Notes (Signed)
LCSW Group Therapy Note  03/30/2018   10:00-11:00am   Type of Therapy and Topic:  Group Therapy: Anger Cues and Responses  Participation Level:  Active   Description of Group:   In this group, patients learned how to recognize the physical, cognitive, emotional, and behavioral responses they have to anger-provoking situations.  They identified a recent time they became angry and how they reacted.  They analyzed how their reaction was possibly beneficial and how it was possibly unhelpful.  The group discussed a variety of healthier coping skills that could help with such a situation in the future.  Deep breathing was practiced briefly.  Therapeutic Goals: 1. Patients will remember their last incident of anger and how they felt emotionally and physically, what their thoughts were at the time, and how they behaved. 2. Patients will identify how their behavior at that time worked for them, as well as how it worked against them. 3. Patients will explore possible new behaviors to use in future anger situations. 4. Patients will learn that anger itself is normal and cannot be eliminated, and that healthier reactions can assist with resolving conflict rather than worsening situations.  Summary of Patient Progress:  The patient shared that her most recent time of anger was this morning and said her nurse tried to take her blood pressure without her having an interpreter present, and became visibly upset with her lack of understanding.  This irritated her and she went back to her room to lay down as a result.  Also, yesterday she was informed by the Department of Social Services that although they had told her they would not remove her children if she came to the hospital for treatment, they have in fact removed her children and put them in foster care.  She is now being told that it will be at least 2-3 months before she can see her children.  She feels betrayed and angry and stated she has been crying quite  a bit over the situation.  Therapeutic Modalities:   Cognitive Behavioral Therapy  Lynnell Chad

## 2018-03-31 NOTE — Progress Notes (Signed)
D Pt is observed OOB [facility] on the 300 hall today. Her interpreter is with her the majority of the day, She endorses a flat, depressed affect. She has difficulty processing how  And why DSS removed her children from her care in their home.     A With the interpreter's help, she answered her assessment questions on her daily inventory. She denied having SI and she rated her depression, hopelessness and anxiety " 10/5/5", respectively. She is not sure where she will go at discharge.     R Safety is in place.

## 2018-03-31 NOTE — Progress Notes (Signed)
[facility] MD Progress Note  03/31/2018 8:31 AM Susan Mcconnell  MRN:  [id number] Subjective:    Patient seen remains somewhat isolative today she currently denies wanting to harm herself but states she is fearful of going home because there is no one there she is lost custody of her children, she apparently had no water or electricity for period of time were not sure of the current status of that. Mood is flat affect constricted denies thoughts of harming self but has numerous risk factors for self-harm. Cognitive therapy today minimal participation affect constricted as mentioned  Principal Problem: Alcoholism/depression Diagnosis: Active Problems:   MDD (major depressive disorder), severe (HCC)  Total Time spent with patient: 20 minutes   Past Medical History:  Past Medical History:  Diagnosis Date  . Alcoholic hepatitis   . Pneumonia   . TB (tuberculosis) ~ 2017   "took the RX and was healed" (05/04/2017)    Past Surgical History:  Procedure Laterality Date  . OTHER SURGICAL HISTORY  2010   "birth control surgery; don't know what they did"   Family History:  Family History  Family history unknown: Yes    Social History:  Social History   Substance and Sexual Activity  Alcohol Use Yes  . Alcohol/week: 21.0 standard drinks  . Types: 21 Cans of beer per week   Comment: 05/04/2017 "1 can, 3 times/day"     Social History   Substance and Sexual Activity  Drug Use Never    Social History   Socioeconomic History  . Marital status: Divorced    Spouse name: Not on file  . Number of children: Not on file  . Years of education: Not on file  . Highest education level: Not on file  Occupational History  . Not on file  Social Needs  . Financial resource strain: Not on file  . Food insecurity:    Worry: Not on file    Inability: Not on file  . Transportation needs:    Medical: Not on file    Non-medical: Not on file  Tobacco Use  . Smoking status: Never Smoker  . Smokeless  tobacco: Current User    Types: Chew  Substance and Sexual Activity  . Alcohol use: Yes    Alcohol/week: 21.0 standard drinks    Types: 21 Cans of beer per week    Comment: 05/04/2017 "1 can, 3 times/day"  . Drug use: Never  . Sexual activity: Not Currently  Lifestyle  . Physical activity:    Days per week: Not on file    Minutes per session: Not on file  . Stress: Not on file  Relationships  . Social connections:    Talks on phone: Not on file    Gets together: Not on file    Attends religious service: Not on file    Active member of club or organization: Not on file    Attends meetings of clubs or organizations: Not on file    Relationship status: Not on file  Other Topics Concern  . Not on file  Social History Narrative  . Not on file   Additional Social History:    Pain Medications: none Prescriptions: none Over the Counter: none History of alcohol / drug use?: No history of alcohol / drug abuse                    Sleep: Good  Appetite:  Good  Current Medications: Current Facility-Administered Medications  Medication Dose Route Frequency  Provider Last Rate Last Dose  . alum & mag hydroxide-simeth (MAALOX/MYLANTA) 200-200-20 MG/5ML suspension 30 mL  30 mL Oral Q4H PRN Money, Gerlene Burdock, FNP   30 mL at 03/25/18 2236  . FLUoxetine (PROZAC) capsule 20 mg  20 mg Oral Daily Malvin Johns, MD   20 mg at 03/30/18 0912  . ibuprofen (ADVIL,MOTRIN) tablet 600 mg  600 mg Oral Q6H PRN Nira Conn A, NP   600 mg at 03/30/18 2132  . magnesium hydroxide (MILK OF MAGNESIA) suspension 30 mL  30 mL Oral Daily PRN Money, Gerlene Burdock, FNP      . MUSCLE RUB CREA   Topical TID PRN Cobos, Rockey Situ, MD      . nicotine (NICODERM CQ - dosed in mg/24 hours) patch 21 mg  21 mg Transdermal Daily Cobos, Rockey Situ, MD   21 mg at 03/30/18 0917  . prenatal multivitamin tablet 1 tablet  1 tablet Oral Daily Malvin Johns, MD   1 tablet at 03/30/18 0912  . thiamine (B-1) injection 100 mg  100 mg  Intramuscular Once Money, Feliz Beam B, FNP      . thiamine (VITAMIN B-1) tablet 100 mg  100 mg Oral Daily Money, Gerlene Burdock, FNP   100 mg at 03/30/18 0912  . traZODone (DESYREL) tablet 100 mg  100 mg Oral QHS PRN Nira Conn A, NP   100 mg at 03/30/18 2132    Lab Results: No results found for this or any previous visit (from the past 48 hour(s)).  Blood Alcohol level:  Lab Results  Component Value Date   ETH 41 (H) 03/23/2018   ETH 382 (HH) 02/03/2018    Metabolic Disorder Labs: Lab Results  Component Value Date   HGBA1C 5.7 (H) 05/04/2017   MPG 116.89 05/04/2017   No results found for: PROLACTIN Lab Results  Component Value Date   CHOL 349 (H) 05/06/2017   TRIG 790 (H) 05/06/2017   HDL <10 (L) 05/06/2017   CHOLHDL NOT DONE 05/06/2017   VLDL UNABLE TO CALCULATE IF TRIGLYCERIDE OVER 400 mg/dL 07/61/5183   LDLCALC UNABLE TO CALCULATE IF TRIGLYCERIDE OVER 400 mg/dL 43/73/5789   LDLCALC UNABLE TO CALCULATE IF TRIGLYCERIDE OVER 400 mg/dL 78/47/8412    Physical Findings: AIMS: Facial and Oral Movements Muscles of Facial Expression: None, normal Lips and Perioral Area: None, normal Jaw: None, normal Tongue: None, normal,Extremity Movements Upper (arms, wrists, hands, fingers): None, normal Lower (legs, knees, ankles, toes): None, normal, Trunk Movements Neck, shoulders, hips: None, normal, Overall Severity Severity of abnormal movements (highest score from questions above): None, normal Incapacitation due to abnormal movements: None, normal Patient's awareness of abnormal movements (rate only patient's report): No Awareness, Dental Status Current problems with teeth and/or dentures?: No Does patient usually wear dentures?: No  CIWA:  CIWA-Ar Total: 1 COWS:  COWS Total Score: 1  Musculoskeletal: Strength & Muscle Tone: within normal limits Gait & Station: normal Patient leans: N/A  Psychiatric Specialty Exam: Physical Exam  ROS  Blood pressure (!) 84/53, pulse 100,  temperature (!) 97.2 F (36.2 C), temperature source Oral, resp. rate 20, height 4\' 11"  (1.499 m), weight 45.8 kg, SpO2 99 %.Body mass index is 20.4 kg/m.  General Appearance: Casual  Eye Contact:  Fair  Speech:  Slow  Volume:  Decreased  Mood:  Depressed  Affect:  Blunt  Thought Process:  Coherent  Orientation:  Full (Time, Place, and Person)  Thought Content:  Logical  Suicidal Thoughts:  No  Homicidal Thoughts:  No  Memory:  Immediate;   Fair  Judgement:  Fair  Insight:  Fair  Psychomotor Activity:  Decreased  Concentration:  Concentration: Fair  Recall:  [employer] of Knowledge:  Fair  Language:  Fair  Akathisia:  Negative  Handed:  Right  AIMS (if indicated):     Assets:  Physical Health Resilience  ADL's:  Intact  Cognition:  WNL  Sleep:  Number of Hours: 6     Treatment Plan Summary: Daily contact with patient to assess and evaluate symptoms and progress in treatment, Medication management and Plan No change in current meds or precautions again discussed with case workers tomorrow continue cognitive-based therapies  Malvin Johns, MD 03/31/2018, 8:31 AM

## 2018-03-31 NOTE — BHH Counselor (Signed)
Clinical Social Work Note  CSW received phone call from patient's worker Montez Morita [phone], who reported extreme concern that patient is capable of actually following through on suicide threat.  She states that patient has been hospitalized before, but this is the first time she will be going home to not having her children in the house.  Worker stated she has worked with patient for 2 years and "I know the home environment.  She has always said she would kill herself if they took her kids.  This is not her first time released but this time she is going home to no kids."  Worker has been in touch with [facility] and they are willing to entertain a referral.  Somebody from the [facility] could take her if she was accepted there.  CSW explained it is 90 miles away.  Worker stated that although [facility] has not indicated patient will be required to go to rehab, it is part of the reunification plan that she does go to rehab.  CSW explained that patient will be approached today about her willingness to sign a release to enable a referral to [facility] to be initiated.  It was further explained that while her social worker can advocate for her to be kept in the hospital past her current discharged plan date of tomorrow in order to secure this placement, it will depend entirely on her being willing to be referred and her mental state/readiness for discharge.  CSW agreed to keep in touch with worker about developments.  Ambrose Mantle, LCSW 03/31/2018, 9:32 AM

## 2018-03-31 NOTE — BHH Counselor (Signed)
Clinical Social Work Note  With written consent, CSW did referral to [facility] and faxed it, along with required paperwork and the authorization number from the [facility].  Telephone call to [facility] confirmed they received this referral.  They were asked to inform weekday CSW about possible admission, and were given her direct #.  Ambrose Mantle, LCSW 03/31/2018, 5:06 PM

## 2018-03-31 NOTE — B[facility] Group Notes (Signed)
B[facility] LCSW Group Therapy Note  03/31/2018   10:00-11:00AM  Type of Therapy and Topic:  Group Therapy:  Unhealthy versus Healthy Supports, Which Am I?  Participation Level:  Active   Description of Group:  Patients in this group were introduced to the concept that additional supports including self-support are an essential part of recovery.  Initially a discussion was held about the differences between healthy versus unhealthy supports.  Patients were asked to share what unhealthy supports in their lives need to be addressed, as well as what additional healthy supports could be added for greater help in reaching their goals.   A song entitled "My Own Hero" was played and a group discussion ensued in which patients stated they could relate to the song and it inspired them to realize they have be willing to help themselves in order to succeed, because other people cannot achieve sobriety or stability for them.  We discussed adding a variety of healthy supports to address the various needs in patient lives, including becoming more self-supportive.  Therapeutic Goals: 1)  Highlight the differences between healthy and unhealthy supports 2)  Suggest the importance of being a part of one's own support system 2)  Discuss reasons people in one's life may eventually be unable to be continually supportive  3)  Identify the patient's current support system and   4)  elicit commitments to add healthy supports and to become more conscious of being self-supportive   Summary of Patient Progress:  The patient expressed that the unhealthy support which needs to be addressed includes herself when she is drinking.  Healthy supports which could be added for increased stability and happiness include a job and somebody to tell her to stop drinking.  We had been talking individually about going to rehab, so CSW talked about the ways in which rehab helps her to be able to tell herself either to stop drinking or to reach out for  assistance to avoid it.  Therapeutic Modalities:   Motivational Interviewing Activity  Lynnell Chad

## 2018-03-31 NOTE — Progress Notes (Signed)
D: Pt denies SI/HI/AVh. Pt is pleasant and cooperative. Pt visible in dayroom, pt continues to be sad about DSS removing he kids.  A: Pt was offered support and encouragement. Pt was given scheduled medications. Pt was encourage to attend groups. Q 15 minute checks were done for safety.  R:Pt attends groups and interacts well with peers and staff. Pt is taking medication. Pt has no complaints.Pt receptive to treatment and safety maintained on unit.  Problem: Activity: Goal: Interest or engagement in leisure activities will improve Outcome: Progressing   Problem: Coping: Goal: Coping ability will improve Outcome: Progressing

## 2018-03-31 NOTE — [facility] Group Notes (Signed)
[facility] Group Notes:  (Nursing/MHT/Case Management/Adjunct)  Date:  03/31/2018  Time:  6:23 PM  Type of Therapy:  Nurse Education  Participation Level:  Active  Participation Quality:  Appropriate and Attentive  Affect:  Appropriate  Cognitive:  Alert and Appropriate  Insight:  Good  Engagement in Group:  Engaged  Modes of Intervention:  Activity and Discussion  Summary of Progress/Problems: In this group, the RN reviewed healthy coping strategies. The patients were taught why they struggle with symptoms of depression and how to work through them. The patient was cooperative and participated.  Kirstie Mirza 03/31/2018, 6:23 PM

## 2018-03-31 NOTE — Progress Notes (Signed)
Patient attended AA group meeting.  

## 2018-04-01 MED ORDER — LOPERAMIDE HCL 2 MG PO CAPS: 2.0000 mg | ORAL_CAPSULE | ORAL | Status: DC | PRN

## 2018-04-01 NOTE — Progress Notes (Signed)
Recreation Therapy Notes  Date:  3.2.20 Time: 0930 Location: 300 [address] Dayroom  Group Topic: Stress Management  Goal Area(s) Addresses:  Patient will identify positive stress management techniques. Patient will identify benefits of using stress management post d/c.  Behavioral Response:  Engaged    Intervention: Stress Management  Activity :  Meditation.  LRT introduced the stress management technique of meditation.  LRT played a meditation that focused on having a productive and meaningful day.  Patients were to listen and follow along as meditation played.  Education:  Stress Management, Discharge Planning.   Education Outcome: Acknowledges Education  Clinical Observations/Feedback: Pt attend and participated in group.     Caroll Rancher, LRT/CTRS         Caroll Rancher A 04/01/2018 11:19 AM

## 2018-04-01 NOTE — Progress Notes (Addendum)
[facility]  04/01/2018 9:09 AM Rada HayDhan Baum  MRN:  [id number] Subjective:    Patient seen she is more withdrawn she is a bit regressed she denies that she is particular depressed however her situation has numerous stressors she does not have thoughts of harming her self again minimally talkative minimally engaged with looking into rehab options but no cravings tremors or withdrawal symptoms no seizure activity or psychosis  Principal Problem: Altered factorial-history of traumatic brain injuries with some residual deficits likely, alcoholism status post detox, depression, and added stressor of loss of children as far as custody, due to a house in disrepair without electricity so forth  Diagnosis: Active Problems:   MDD (major depressive disorder), severe (HCC)  Total Time spent with patient: 30 minutes    Past Medical History:  Past Medical History:  Diagnosis Date  . Alcoholic hepatitis   . Pneumonia   . TB (tuberculosis) ~ 2017   "took the RX and was healed" (05/04/2017)    Past Surgical History:  Procedure Laterality Date  . OTHER SURGICAL HISTORY  2010   "birth control surgery; don't know what they did"   Family History:  Family History  Family history unknown: Yes   Family Psychiatric  History: neg Social History:  Social History   Substance and Sexual Activity  Alcohol Use Yes  . Alcohol/week: 21.0 standard drinks  . Types: 21 Cans of beer per week   Comment: 05/04/2017 "1 can, 3 times/day"     Social History   Substance and Sexual Activity  Drug Use Never    Social History   Socioeconomic History  . Marital status: Divorced    Spouse name: Not on file  . Number of children: Not on file  . Years of education: Not on file  . Highest education level: Not on file  Occupational History  . Not on file  Social Needs  . Financial resource strain: Not on file  . Food insecurity:    Worry: Not on file    Inability: Not on file  . Transportation needs:     Medical: Not on file    Non-medical: Not on file  Tobacco Use  . Smoking status: Never Smoker  . Smokeless tobacco: Current User    Types: Chew  Substance and Sexual Activity  . Alcohol use: Yes    Alcohol/week: 21.0 standard drinks    Types: 21 Cans of beer per week    Comment: 05/04/2017 "1 can, 3 times/day"  . Drug use: Never  . Sexual activity: Not Currently  Lifestyle  . Physical activity:    Days per week: Not on file    Minutes per session: Not on file  . Stress: Not on file  Relationships  . Social connections:    Talks on phone: Not on file    Gets together: Not on file    Attends religious service: Not on file    Active member of club or organization: Not on file    Attends meetings of clubs or organizations: Not on file    Relationship status: Not on file  Other Topics Concern  . Not on file  Social History Narrative  . Not on file   Additional Social History:    Pain Medications: none Prescriptions: none Over the Counter: none History of alcohol / drug use?: No history of alcohol / drug abuse  Sleep: Good  Appetite:  Good  Current Medications: Current Facility-Administered Medications  Medication Dose Route Frequency Provider Last Rate Last Dose  . alum & mag hydroxide-simeth (MAALOX/MYLANTA) 200-200-20 MG/5ML suspension 30 mL  30 mL Oral Q4H PRN Money, Gerlene Burdock, FNP   30 mL at 03/25/18 2236  . FLUoxetine (PROZAC) capsule 20 mg  20 mg Oral Daily Malvin Johns, MD   20 mg at 03/31/18 0907  . ibuprofen (ADVIL,MOTRIN) tablet 600 mg  600 mg Oral Q6H PRN Nira Conn A, NP   600 mg at 04/01/18 5597  . magnesium hydroxide (MILK OF MAGNESIA) suspension 30 mL  30 mL Oral Daily PRN Money, Gerlene Burdock, FNP      . MUSCLE RUB CREA   Topical TID PRN Cobos, Rockey Situ, MD      . nicotine (NICODERM CQ - dosed in mg/24 hours) patch 21 mg  21 mg Transdermal Daily Cobos, Rockey Situ, MD   21 mg at 03/31/18 0941  . prenatal multivitamin tablet 1  tablet  1 tablet Oral Daily Malvin Johns, MD   1 tablet at 03/31/18 0907  . thiamine (B-1) injection 100 mg  100 mg Intramuscular Once Money, Feliz Beam B, FNP      . thiamine (VITAMIN B-1) tablet 100 mg  100 mg Oral Daily Money, Gerlene Burdock, FNP   100 mg at 03/31/18 0907  . traZODone (DESYREL) tablet 100 mg  100 mg Oral QHS PRN Nira Conn A, NP   100 mg at 03/31/18 2155    Lab Results: No results found for this or any previous visit (from the past 48 hour(s)).  Blood Alcohol level:  Lab Results  Component Value Date   ETH 41 (H) 03/23/2018   ETH 382 (HH) 02/03/2018    Metabolic Disorder Labs: Lab Results  Component Value Date   HGBA1C 5.7 (H) 05/04/2017   MPG 116.89 05/04/2017   No results found for: PROLACTIN Lab Results  Component Value Date   CHOL 349 (H) 05/06/2017   TRIG 790 (H) 05/06/2017   HDL <10 (L) 05/06/2017   CHOLHDL NOT DONE 05/06/2017   VLDL UNABLE TO CALCULATE IF TRIGLYCERIDE OVER 400 mg/dL 41/63/8453   LDLCALC UNABLE TO CALCULATE IF TRIGLYCERIDE OVER 400 mg/dL 64/68/0321   LDLCALC UNABLE TO CALCULATE IF TRIGLYCERIDE OVER 400 mg/dL 22/48/2500    Physical Findings: AIMS: Facial and Oral Movements Muscles of Facial Expression: None, normal Lips and Perioral Area: None, normal Jaw: None, normal Tongue: None, normal,Extremity Movements Upper (arms, wrists, hands, fingers): None, normal Lower (legs, knees, ankles, toes): None, normal, Trunk Movements Neck, shoulders, hips: None, normal, Overall Severity Severity of abnormal movements (highest score from questions above): None, normal Incapacitation due to abnormal movements: None, normal Patient's awareness of abnormal movements (rate only patient's report): No Awareness, Dental Status Current problems with teeth and/or dentures?: No Does patient usually wear dentures?: No  CIWA:  CIWA-Ar Total: 1 COWS:  COWS Total Score: 1  Musculoskeletal: Strength & Muscle Tone: decreased Gait & Station: normal Patient  leans: N/A  Psychiatric Specialty Exam: Physical Exam  ROS  Blood pressure (!) 83/55, pulse 97, temperature (!) 97.2 F (36.2 C), temperature source Oral, resp. rate 20, height 4\' 11"  (1.499 m), weight 45.8 kg, SpO2 99 %.Body mass index is 20.4 kg/m.  General Appearance: Casual and Disheveled  Eye Contact:  Poor  Speech:  Slow  Volume:  Decreased  Mood:  Depressed  Affect:  Blunt  Thought Process:  Goal Directed  Orientation:  Full (  Time, Place, and Person)  Thought Content:  poverty   Suicidal Thoughts:  denies  Homicidal Thoughts:  No  Memory:  Immediate;   Fair  Judgement:  Fair  Insight:  Fair  Psychomotor Activity:  Decreased  Concentration:  Concentration: Fair  Recall:  [employer] of Knowledge:  Fair  Language:  Fair  Akathisia:  Negative  Handed:  Right  AIMS (if indicated):     Assets:  Resilience  ADL's:  Intact  Cognition:  WNL  Sleep:  Number of Hours: 6.5   Treatment Plan Summary: Daily contact with patient to assess and evaluate symptoms and progress in treatment, Medication management and Plan Continue current precautions continue to seek rehab measures continue antidepressant therapy cognitive therapy   Spoke with the patient again with the aid of a translator so that everything could be clarified, she gave appropriate answers she is alert and oriented without thoughts of harming her self, and does agree still to rehab.  Can contract fully.  No cravings for alcohol  Dedee Liss, MD 04/01/2018, 9:09 AM

## 2018-04-01 NOTE — Progress Notes (Signed)
D: Pt denies SI/HI/AVH. Pt is pleasant and cooperative. Pt stated she was going to rehab tomorrow. Pt visible on the unit this evening.   A: Pt was offered support and encouragement. Pt was given scheduled medications. Pt was encourage to attend groups. Q 15 minute checks were done for safety.   R:Pt attends groups and interacts  with peers and staff. Pt is taking medication. Pt has no complaints.Pt receptive to treatment and safety maintained on unit.

## 2018-04-01 NOTE — Progress Notes (Addendum)
Patient has been accepted to [facility], she may arrive on Wednesday, 04/03/2018 at 10am.   -CSW attempted to inform case worker, [name] [phone]). Left a detailed voicemail. CSW spoke with case worker earlier today, who reports she can transport the patient to [facility].   -CSW requested interpreting services for the patient tomorrow (03/03).  Enid Cutter, LCSW-A Clinical Social Worker

## 2018-04-01 NOTE — Plan of Care (Signed)
Progress note  D: pt found in bed: would not awaken for this writer for her assessment. Pt denies any physical pain, but states she will let me know when her left knee is bothering her. Pt has been in groups today with her interpreter. Pt denies si/hi/ah/vh and verbally agrees to approach staff if these become apparent or before harming herself/others while at [facility].  A: pt provided support and encouragement. Pt provided support and encouragement. Q53m safety checks implemented and continued.  R: pt safe on the unit. Will continue to monitor.   Pt progressing in the following metrics  Problem: Education: Goal: Utilization of techniques to improve thought processes will improve Outcome: Progressing   Problem: Health Behavior/Discharge Planning: Goal: Ability to make decisions will improve Outcome: Progressing Goal: Compliance with therapeutic regimen will improve Outcome: Progressing   Problem: Role Relationship: Goal: Will demonstrate positive changes in social behaviors and relationships Outcome: Progressing

## 2018-04-01 NOTE — [facility] Group Notes (Signed)
Spiritual Care Group Therapy   Type of Therapy and Topic: Chaplain group.Chaplain engaged group in discussion regarding grief, loss, and supports.   Participation level: Did not attend   Modes of Intervention: Discussion, Education and Socialization   Summary of Progress/Problems: Patient did not attend  Enid Cutter, LCSW-A Clinical Social Worker

## 2018-04-02 MED ORDER — TRAZODONE HCL 100 MG PO TABS: 100.0000 mg | ORAL_TABLET | Freq: Every evening | ORAL | 0 refills | Status: AC | PRN

## 2018-04-02 MED ORDER — FLUOXETINE HCL 20 MG PO CAPS: 20.0000 mg | ORAL_CAPSULE | Freq: Every day | ORAL | 0 refills | Status: AC

## 2018-04-02 MED ORDER — PRENATAL MULTIVITAMIN CH: 1.0000 | ORAL_TABLET | Freq: Every day | ORAL | 2 refills | Status: AC

## 2018-04-02 NOTE — Progress Notes (Signed)
D: Pt denies SI/HI/AVH. Pt is pleasant and cooperative. Pt visible in dayroom . Pt stated she was ready to go to rehab A: Pt was offered support and encouragement. Pt was encourage to attend groups. Q 15 minute checks were done for safety.  R:Pt attends groups and interacts well with peers and staff. Pt is taking medication. Pt receptive to treatment and safety maintained on unit.  Problem: Activity: Goal: Interest or engagement in leisure activities will improve Outcome: Progressing   Problem: Coping: Goal: Coping ability will improve Outcome: Progressing

## 2018-04-02 NOTE — [facility] Group Notes (Signed)
Adult Psychoeducational Group Note  Date:  04/02/2018 Time:  8:53 PM  Group Topic/Focus:  Wrap-Up Group:   The focus of this group is to help patients review their daily goal of treatment and discuss progress on daily workbooks.  Participation Level:  Active  Participation Quality:  Appropriate and Attentive  Affect:  Appropriate  Cognitive:  Alert and Appropriate  Insight: Appropriate and Good  Engagement in Group:  Engaged  Modes of Intervention:  Discussion and Education  Additional Comments:  Pt attended and participated in wrap up group this evening. Pt had a good day, but did not elaborate. Pt is preparing to leave tomorrow.   Chrisandra Netters 04/02/2018, 8:53 PM

## 2018-04-02 NOTE — Progress Notes (Signed)
CSW spoke to pt through interpretor about discharge to [facility] tomorrow.  Pt planning to go, says "[name]" is going to bring her.  CSW checked notes, contacted pt casework [name], who returned call later and confirmed that she is planning to pick pt up at 8am tomorrow for transport to [facility]. Garner Nash, MSW, LCSW Clinical Social Worker 04/02/2018 12:41 PM

## 2018-04-02 NOTE — [facility] Suicide Risk Assessment (Signed)
[facility] Discharge Suicide Risk Assessment   Principal Problem: Alcoholism/depression Discharge Diagnoses: Active Problems:   MDD (major depressive disorder), severe (HCC)   Total Time spent with patient: 45 minutes  He is alert oriented and cooperative denies suicidal thoughts no psychosis no cravings tremors or withdrawal symptoms  mental Status Per Nursing Assessment::   On Admission:  NA  Demographic Factors:  Living alone and Unemployed  Loss Factors: Loss of significant relationship  Historical Factors: NA  Risk Reduction Factors:   Positive therapeutic relationship  Continued Clinical Symptoms:  Alcohol/Substance Abuse/Dependencies  Cognitive Features That Contribute To Risk:  None    Suicide Risk:  Minimal: No identifiable suicidal ideation.  Patients presenting with no risk factors but with morbid ruminations; may be classified as minimal risk based on the severity of the depressive symptoms  Follow-up Information    [facility] Follow up.   Specialty:  Professional Counselor Why:  Please follow up for services during walk-in hours, Monday-Friday 8:30a. - 2:00p.  Please bring your photo ID, current medications, and discharge paperwork from this hospitalization.  Contact information: [facility] [address] [place] [address] [phone]        [facility] Follow up.   Why:  Referral faxed 03/01. Follow up Contact information: [address] [place] [address] [phone]           Plan Of Care/Follow-up recommendations:  Activity:  full  Sheretta Grumbine, MD 04/02/2018, 11:09 AM

## 2018-04-02 NOTE — Progress Notes (Signed)
Pt complaining of L-knee pain 10/10 . Pt given ibuprofen per MAR and cold pack and elevated leg. Pt wanted to get an X-ray of her knee.

## 2018-04-02 NOTE — Care Plan (Signed)
In anticipation of early morning discharge suicide risk assessment completed today, discharge orders entered for tomorrow morning, prescriptions printed

## 2018-04-02 NOTE — Progress Notes (Signed)
[facility] MD Progress Note  04/02/2018 7:50 AM Susan Mcconnell  MRN:  [id number] Subjective:    Patient continues to deny suicidal thoughts she has no withdrawal symptoms.  Affect is still constricted she acknowledges depressive symptoms being separated from her children so forth. Does agree to rehab.  Complaining of left lower extremity pain, has a cold pack no obvious injury noted  Principal Problem: Alcoholism/depression/psychosocial stressors Diagnosis: Active Problems:   MDD (major depressive disorder), severe (HCC)  Total Time spent with patient: 20 minutes  Past Medical History:  Past Medical History:  Diagnosis Date  . Alcoholic hepatitis   . Pneumonia   . TB (tuberculosis) ~ 2017   "took the RX and was healed" (05/04/2017)    Past Surgical History:  Procedure Laterality Date  . OTHER SURGICAL HISTORY  2010   "birth control surgery; don't know what they did"   Family History:  Family History  Family history unknown: Yes   Family Psychiatric  History: neg Social History:  Social History   Substance and Sexual Activity  Alcohol Use Yes  . Alcohol/week: 21.0 standard drinks  . Types: 21 Cans of beer per week   Comment: 05/04/2017 "1 can, 3 times/day"     Social History   Substance and Sexual Activity  Drug Use Never    Social History   Socioeconomic History  . Marital status: Divorced    Spouse name: Not on file  . Number of children: Not on file  . Years of education: Not on file  . Highest education level: Not on file  Occupational History  . Not on file  Social Needs  . Financial resource strain: Not on file  . Food insecurity:    Worry: Not on file    Inability: Not on file  . Transportation needs:    Medical: Not on file    Non-medical: Not on file  Tobacco Use  . Smoking status: Never Smoker  . Smokeless tobacco: Current User    Types: Chew  Substance and Sexual Activity  . Alcohol use: Yes    Alcohol/week: 21.0 standard drinks    Types: 21 Cans of  beer per week    Comment: 05/04/2017 "1 can, 3 times/day"  . Drug use: Never  . Sexual activity: Not Currently  Lifestyle  . Physical activity:    Days per week: Not on file    Minutes per session: Not on file  . Stress: Not on file  Relationships  . Social connections:    Talks on phone: Not on file    Gets together: Not on file    Attends religious service: Not on file    Active member of club or organization: Not on file    Attends meetings of clubs or organizations: Not on file    Relationship status: Not on file  Other Topics Concern  . Not on file  Social History Narrative  . Not on file   Additional Social History:    Pain Medications: none Prescriptions: none Over the Counter: none History of alcohol / drug use?: No history of alcohol / drug abuse                    Sleep: Good  Appetite:  Good  Current Medications: Current Facility-Administered Medications  Medication Dose Route Frequency Provider Last Rate Last Dose  . alum & mag hydroxide-simeth (MAALOX/MYLANTA) 200-200-20 MG/5ML suspension 30 mL  30 mL Oral Q4H PRN Money, Gerlene Burdock, FNP   30  mL at 03/25/18 2236  . FLUoxetine (PROZAC) capsule 20 mg  20 mg Oral Daily Malvin JohnsFarah, Tamberlyn Midgley, MD   20 mg at 04/01/18 1124  . ibuprofen (ADVIL,MOTRIN) tablet 600 mg  600 mg Oral Q6H PRN Nira ConnBerry, Jason A, NP   600 mg at 04/02/18 0406  . loperamide (IMODIUM) capsule 2-4 mg  2-4 mg Oral PRN Nira ConnBerry, Jason A, NP      . magnesium hydroxide (MILK OF MAGNESIA) suspension 30 mL  30 mL Oral Daily PRN Money, Gerlene Burdockravis B, FNP      . MUSCLE RUB CREA   Topical TID PRN Cobos, Rockey SituFernando A, MD      . nicotine (NICODERM CQ - dosed in mg/24 hours) patch 21 mg  21 mg Transdermal Daily Cobos, Rockey SituFernando A, MD   21 mg at 04/01/18 1124  . prenatal multivitamin tablet 1 tablet  1 tablet Oral Daily Malvin JohnsFarah, Joslynne Klatt, MD   1 tablet at 04/01/18 1124  . thiamine (B-1) injection 100 mg  100 mg Intramuscular Once Money, Feliz Beamravis B, FNP      . thiamine (VITAMIN B-1)  tablet 100 mg  100 mg Oral Daily Money, Travis B, FNP   100 mg at 04/01/18 1124  . traZODone (DESYREL) tablet 100 mg  100 mg Oral QHS PRN Nira ConnBerry, Jason A, NP   100 mg at 04/01/18 2134    Lab Results: No results found for this or any previous visit (from the past 48 hour(s)).  Blood Alcohol level:  Lab Results  Component Value Date   ETH 41 (H) 03/23/2018   ETH 382 (HH) 02/03/2018    Metabolic Disorder Labs: Lab Results  Component Value Date   HGBA1C 5.7 (H) 05/04/2017   MPG 116.89 05/04/2017   No results found for: PROLACTIN Lab Results  Component Value Date   CHOL 349 (H) 05/06/2017   TRIG 790 (H) 05/06/2017   HDL <10 (L) 05/06/2017   CHOLHDL NOT DONE 05/06/2017   VLDL UNABLE TO CALCULATE IF TRIGLYCERIDE OVER 400 mg/dL 69/62/952804/07/2017   LDLCALC UNABLE TO CALCULATE IF TRIGLYCERIDE OVER 400 mg/dL 41/32/440104/07/2017   LDLCALC UNABLE TO CALCULATE IF TRIGLYCERIDE OVER 400 mg/dL 02/72/536604/05/2017    Physical Findings: AIMS: Facial and Oral Movements Muscles of Facial Expression: None, normal Lips and Perioral Area: None, normal Jaw: None, normal Tongue: None, normal,Extremity Movements Upper (arms, wrists, hands, fingers): None, normal Lower (legs, knees, ankles, toes): None, normal, Trunk Movements Neck, shoulders, hips: None, normal, Overall Severity Severity of abnormal movements (highest score from questions above): None, normal Incapacitation due to abnormal movements: None, normal Patient's awareness of abnormal movements (rate only patient's report): No Awareness, Dental Status Current problems with teeth and/or dentures?: No Does patient usually wear dentures?: No  CIWA:  CIWA-Ar Total: 1 COWS:  COWS Total Score: 1  Musculoskeletal: Strength & Muscle Tone: within normal limits Gait & Station: normal Patient leans: N/A  Psychiatric Specialty Exam: Physical Exam  ROS  Blood pressure (!) 86/53, pulse 71, temperature 97.8 F (36.6 C), temperature source Oral, resp. rate 16, height  4\' 11"  (1.499 m), weight 45.8 kg, SpO2 99 %.Body mass index is 20.4 kg/m.  General Appearance: Casual  Eye Contact:  Minimal  Speech:  Slow  Volume:  Decreased  Mood:  Anxious  Affect:  Appropriate  Thought Process:  Goal Directed  Orientation:  Full (Time, Place, and Person)  Thought Content:  Logical  Suicidal Thoughts:  No  Homicidal Thoughts:  No  Memory:  Immediate;   Good  Judgement:  Good  Insight:  Fair  Psychomotor Activity:  Decreased  Concentration:  Concentration: Fair  Recall:  [employer] of Knowledge:  Fair  Language:  Fair  Akathisia:  Negative  Handed:  Right  AIMS (if indicated):     Assets:  Resilience Social Support  ADL's:  Intact  Cognition:  WNL  Sleep:  Number of Hours: 5.75     Treatment Plan Summary: Daily contact with patient to assess and evaluate symptoms and progress in treatment, Medication management and Plan Continue current meds and precautions probable discharge to rehab today or tomorrow we will discuss at team  [facility], MD 04/02/2018, 7:50 AM

## 2018-04-02 NOTE — Plan of Care (Signed)
  Problem: Coping: Goal: Coping ability will improve Outcome: Progressing   D: Pt alert and oriented on the unit. Interpreter on the unit with pt. Pt denies SI/HI, A/VH. Pt's affect was flat and mood depressed. Pt participated during unit groups and is pleasant and cooperative.  A: Education, support and encouragement provided, q15 minute safety checks remain in effect. Medications administered per MD orders. R: No reactions/side effects to medicine noted. Pt verbally contracts for safety. Pt remains safe on and off the unit.

## 2018-04-03 DIAGNOSIS — F1010 Alcohol abuse, uncomplicated: Secondary | ICD-10-CM

## 2018-04-03 NOTE — Progress Notes (Signed)
Patient ID: Susan Mcconnell, female   DOB: [birth year], 43 y.o.   MRN: [id number]  D: Pt alert and oriented on the unit.   A: Education, support, and encouragement provided. Discharge summary, medications and follow up appointments reviewed with pt and her case worker. Suicide prevention resources provided, including "My 3 App." Pt's belongings in locker # 47 returned and belongings sheet signed.  R: Pt denies SI/HI, A/VH, pain, or any concerns at this time. Pt ambulatory on and off unit. Pt discharged to lobby.
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
## 2018-04-03 NOTE — Progress Notes (Signed)
  [facility] Adult Case Management Discharge Plan :  Will you be returning to the same living situation after discharge:  No. Going to [facility] At discharge, do you have transportation home?: Yes,  case worker transporting patient  Do you have the ability to pay for your medications: No. Going to residential treatment.  Release of information consent forms completed and in the chart. Patient to Follow up at: Follow-up Information    [facility] Follow up.   Specialty:  Professional Counselor Why:  Please follow up for services during walk-in hours, Monday-Friday 8:30a. - 2:00p.  Please bring your photo ID, current medications, and discharge paperwork from this hospitalization.  Contact information: [facility] [address] [place] [address] [phone]        [facility] Follow up.   Why:  Referral faxed 03/01. Follow up Contact information: [address] [place] [address] [phone]           Next level of care provider has access to [facility] Link:no  Safety Planning and Suicide Prevention discussed: Yes,  with patient  Have you used any form of tobacco in the last 30 days? (Cigarettes, Smokeless Tobacco, Cigars, and/or Pipes): No  Has patient been referred to the Quitline?: N/A patient is not a smoker  Patient has been referred for addiction treatment: Yes  Darreld Mclean, LCSWA 04/03/2018, 8:08 AM

## 2018-05-22 ENCOUNTER — Emergency Department (HOSPITAL_COMMUNITY): Payer: Self-pay

## 2018-05-22 ENCOUNTER — Other Ambulatory Visit: Payer: Self-pay

## 2018-05-22 ENCOUNTER — Emergency Department (HOSPITAL_COMMUNITY)
Admission: EM | Admit: 2018-05-22 | Discharge: 2018-05-22 | Disposition: A | Discharge status: Home / Self Care | Payer: Self-pay | Attending: Emergency Medicine | Admitting: Emergency Medicine

## 2018-05-22 DIAGNOSIS — Z79899 Other long term (current) drug therapy: Secondary | ICD-10-CM | POA: Insufficient documentation

## 2018-05-22 DIAGNOSIS — S92325A Nondisplaced fracture of second metatarsal bone, left foot, initial encounter for closed fracture: Secondary | ICD-10-CM | POA: Insufficient documentation

## 2018-05-22 DIAGNOSIS — Y9389 Activity, other specified: Secondary | ICD-10-CM | POA: Insufficient documentation

## 2018-05-22 DIAGNOSIS — Y999 Unspecified external cause status: Secondary | ICD-10-CM | POA: Insufficient documentation

## 2018-05-22 DIAGNOSIS — F17220 Nicotine dependence, chewing tobacco, uncomplicated: Secondary | ICD-10-CM | POA: Insufficient documentation

## 2018-05-22 DIAGNOSIS — Y929 Unspecified place or not applicable: Secondary | ICD-10-CM | POA: Insufficient documentation

## 2018-05-22 DIAGNOSIS — X58XXXA Exposure to other specified factors, initial encounter: Secondary | ICD-10-CM | POA: Insufficient documentation

## 2018-05-22 MED ORDER — IBUPROFEN 400 MG PO TABS: 400.0000 mg | ORAL_TABLET | Freq: Four times a day (QID) | ORAL | 0 refills | Status: AC | PRN

## 2018-05-22 NOTE — ED Notes (Signed)
Pt discharged via wheelchair to ED entrance.  Pt had crutches and personal belongings in her possession.  Security to provide [employer] to taxi upon arrival.

## 2018-05-22 NOTE — ED Provider Notes (Addendum)
[facility]   CSN: [id number] Arrival date & time: 05/22/18  0830    History   Chief Complaint Chief Complaint  Patient presents with  . Foot Injury    left   Translator used throughout this evaluation.    HPI Susan Mcconnell is a 43 y.o. female.     HPI  Patient is a 43 year old female with a history of alcoholic hepatitis, pneumonia, TB, alcohol abuse, MDD, who presents to the emergency department today for evaluation of left foot pain.  Patient states she was drinking alcohol last night.  She does not think she fell.  She woke up this morning with left foot pain that is severe in nature.  States she cannot walk because the pain is so severe.  Exacerbating factors include ambulation and movement and palpation.  No other reported injuries.  She continually asks if she can go to work today.   Past Medical History:  Diagnosis Date  . Alcoholic hepatitis   . Pneumonia   . TB (tuberculosis) ~ 2017   "took the RX and was healed" (05/04/2017)    Patient Active Problem List   Diagnosis Date Noted  . Alcohol abuse 04/03/2018  . MDD (major depressive disorder), severe (H[facility]) 03/24/2018  . Alcohol use disorder, severe, dependence ([facility])   . Hypernatremia 08/15/2017  . Suicidal ideation 08/15/2017  . Alcohol intoxication (H[facility]) 08/15/2017  . Sepsis due to pneumonia (H[facility]) 06/24/2017  . Alcoholic intoxication without complication (HCC)   . HCAP (healthcare-associated pneumonia)   . Hypotension due to hypovolemia   . Hepatitis   . Hypertriglyceridemia 05/06/2017  . Compensated metabolic acidosis 05/06/2017  . Pancytopenia (HCC)   . Thrombocytopenia (HCC) 05/04/2017  . Hypokalemia 05/04/2017  . Abnormal LFTs 05/04/2017  . Chest pain 05/04/2017  . Protein-calorie malnutrition, moderate (HCC) 05/04/2017  . Elevated liver enzymes     Past Surgical History:  Procedure Laterality Date  . OTHER SURGICAL HISTORY  2010   "birth control  surgery; don't know what they did"     OB History   No obstetric history on file.      Home Medications    Prior to Admission medications   Medication Sig Start Date End Date Taking? Authorizing Provider  FLUoxetine (PROZAC) 20 MG capsule Take 1 capsule (20 mg total) by mouth daily. For mood 04/02/18   Malvin Johns, MD  ibuprofen (ADVIL) 400 MG tablet Take 1 tablet (400 mg total) by mouth every 6 (six) hours as needed for up to 7 days. 05/22/18 05/29/18  Haillee Johann S, PA-C  nicotine (NICODERM CQ - DOSED IN MG/24 HOURS) 21 mg/24hr patch Place 1 patch (21 mg total) onto the skin daily. For smoking cessation 03/29/18   Aldean Baker, NP  Prenatal Vit-Fe Fumarate-FA (PRENATAL MULTIVITAMIN) TABS tablet Take 1 tablet by mouth daily. 04/02/18   Malvin Johns, MD  thiamine 100 MG tablet Take 1 tablet (100 mg total) by mouth daily. Patient not taking: Reported on 03/23/2018 08/18/17   Elgergawy, Leana Roe, MD  traZODone (DESYREL) 100 MG tablet Take 1 tablet (100 mg total) by mouth at bedtime as needed for sleep. 04/02/18   Malvin Johns, MD    Family History Family History  Family history unknown: Yes    Social History Social History   Tobacco Use  . Smoking status: Never Smoker  . Smokeless tobacco: Current User    Types: Chew  Substance Use Topics  . Alcohol use: Yes  Alcohol/week: 21.0 standard drinks    Types: 21 Cans of beer per week    Comment: 05/04/2017 "1 can, 3 times/day"  . Drug use: Never     Allergies   Patient has no known allergies.   Review of Systems Review of Systems  Constitutional: Negative for fever.  HENT: Negative for sore throat.   Respiratory: Negative for shortness of breath.   Cardiovascular: Negative for chest pain.  Gastrointestinal: Negative for abdominal pain.  Genitourinary: Negative for flank pain.  Musculoskeletal: Negative for back pain and neck pain.       Left foot pain  Neurological: Negative for weakness and headaches.     Physical  Exam Updated Vital Signs BP 100/71   Pulse 70   Temp 97.6 F (36.4 C) (Oral)   Resp 16   SpO2 100%   Physical Exam Vitals signs and nursing note reviewed.  Constitutional:      General: She is not in acute distress.    Appearance: She is well-developed.  HENT:     Head: Normocephalic and atraumatic.  Eyes:     Conjunctiva/sclera: Conjunctivae normal.  Neck:     Musculoskeletal: Neck supple.  Cardiovascular:     Rate and Rhythm: Normal rate and regular rhythm.     Heart sounds: No murmur.  Pulmonary:     Effort: Pulmonary effort is normal. No respiratory distress.     Breath sounds: Normal breath sounds.  Abdominal:     Palpations: Abdomen is soft.     Tenderness: There is no abdominal tenderness.  Musculoskeletal:     Comments: TTP over the dorsum of the left mid foot. There is a small are of erythema and ecchymosis over this area. Minimal TTP within the ankle. Normal cap refill.    Skin:    General: Skin is warm and dry.  Neurological:     Mental Status: She is alert.     Comments: Alert, moving all extremities appropriately. Answering questions appropriately.      ED Treatments / Results  Labs (all labs ordered are listed, but only abnormal results are displayed) Labs Reviewed - No data to display  EKG None  Radiology Dg Ankle Complete Left  Result Date: 05/22/2018 CLINICAL DATA:  Pain EXAM: LEFT ANKLE COMPLETE - 3+ VIEW COMPARISON:  Left tibia and fibula February 03, 2018 FINDINGS: Frontal, oblique, and lateral views obtained. There is no demonstrable fracture or joint effusion. No appreciable joint space narrowing or erosion. Ankle mortise appears intact. IMPRESSION: No ankle region fracture evident. No appreciable arthropathy. Ankle mortise appears intact. Electronically Signed   By: Bretta Bang III M.D.   On: 05/22/2018 09:42   Dg Foot Complete Left  Result Date: 05/22/2018 CLINICAL DATA:  Pain EXAM: LEFT FOOT - COMPLETE 3+ VIEW COMPARISON:  None.  FINDINGS: Frontal, oblique, and lateral views obtained. There is a nondisplaced fracture of the proximal metaphysis of the second metatarsal. There is no other appreciable fracture. No dislocation. Joint spaces appear normal. No erosive change. IMPRESSION: Nondisplaced fracture proximal second metatarsal. No other fracture. No dislocation. No evident arthropathy. Electronically Signed   By: Bretta Bang III M.D.   On: 05/22/2018 09:40    Procedures Procedures (including critical care time) SPLINT APPLICATION Date/Time: 11:34 AM Authorized by: Karrie Meres Consent: Verbal consent obtained. Risks and benefits: risks, benefits and alternatives were discussed Consent given by: patient Splint applied by: orthopedic technician Location details: LLE Splint type: posterior ankle splint Post-procedure: The splinted body part was  neurovascularly unchanged following the procedure. Patient tolerance: Patient tolerated the procedure well with no immediate complications.  Medications Ordered in ED Medications - No data to display   Initial Impression / Assessment and Plan / ED Course  I have reviewed the triage vital signs and the nursing notes.  Pertinent labs & imaging results that were available during my care of the patient were reviewed by me and considered in my medical decision making (see chart for details).     Final Clinical Impressions(s) / ED Diagnoses   Final diagnoses:  Closed nondisplaced fracture of second metatarsal bone of left foot, initial encounter   Pt presenting to the ED c/o left foot pain starting this AM. She denies any known injury but does admit to ETOH use last night so may have sustained an injury that she does not recall. She has TTP over the dorsum of the left mid foot. There is a small are of erythema and ecchymosis over this area. Minimal TTP within the ankle. Normal cap refill.   Xray left ankle negative. Xray left foot with nondisplaced fracture  proximal second metatarsal. No other fracture. Pt placed in posterior ankle splint and given crutches. Will give rx for motrin. Avoiding tylenol or narcotics in this patient who uses ETOH chronically. Advised to return to the ED for new or worsening symptoms.  She voices understanding of the plan and reasons to return.  All questions answered.  Patient stable for discharge.  ED Discharge Orders         Ordered    ibuprofen (ADVIL) 400 MG tablet  Every 6 hours PRN     05/22/18 [id number]           Zamari Bonsall S, PA-C 05/22/18 1134    Dhana Totton, Jacobusortni S, PA-C 05/22/18 1134    Lorre NickAllen, Anthony, MD 05/27/18 1258

## 2018-05-22 NOTE — ED Notes (Signed)
Ice pack applied to left foot and ankle.   Pt provided with water and applesauce, encouraged to ingest.

## 2018-05-22 NOTE — ED Notes (Addendum)
[facility] interpreter used during triage and assessment.  Pt reports no recent injuries, falls, or trauma to foot.  Pt gesturing at left foot and grimacing.  Pt continuously asking if she can go to work tomorrow, prior to any sort of diagnostic testing.  Pt informed that her foot would need to be evaluated to determine why she is unable to stand on it.  PA Cortni at bedside.

## 2018-05-22 NOTE — ED Notes (Signed)
UNSUCCESSFUL LAB COLLECTION ATTEMPT 

## 2018-05-22 NOTE — ED Notes (Signed)
Discharge paperwork reviewed with pt using [facility] interpreter.  Pt voiced concern about transportation home.  This RN attempted to contact son, Meuy Binetti, at [phone].  Call was not able to be completed.  SW to be contacted about potential cab voucher as pt is d/c with crutches and is non [place] speaking.

## 2018-05-22 NOTE — ED Notes (Signed)
Bed: BU03 Expected date:  Expected time:  Means of arrival:  Comments: EMS/Foot problem

## 2018-05-22 NOTE — ED Notes (Signed)
Cab voucher received from SW.  Attempted to call [employer] cab x1, no answer.  Will try again. Pt informed of cab voucher using [facility] interpreter.

## 2018-05-22 NOTE — Discharge Instructions (Addendum)
Take prescription as directed. Be aware that there is a risk of having bleeding from your GI tract when taking this medication. If you start vomiting blood, have dark stools or bloody stools please return to the emergency room immediately.   Please keep the leg elevated to help reduce swelling.    Please use crutches and do not put any weight on your leg as it will cause your bones not heal properly.  You were given a referral to an orthopedic doctor.  You will need to call the office to make an appointment for follow-up.  Please return to the emergency department for any new or worsening symptoms.   ------------------------------------------------------------------------------------------   ???????? ?????? ?????????????? ????????? ???? ???????? ?? ?? ???? ???? ?????? GI ????? ????????? ???? ???? ?????? ??? ????? ??? ?????? ???? ????????? ???, ???????? ?? ?? ???? ?? ?, ????? ?????? ???????? ?????? ???????????  ????? ????????? ?????????? ???? ?? ???? ????? ?????  ????? ?????? ?????? ????????? ? ??????? ???????? ???? ??? ??????????? ?????? ???? ??????? ???????? ?????? ???? ????????  ???????? ?????????? ????????? ????? ?????? ??????? ???????? ???? ????????? ???? ???? ?? ????? ?????  ???? ??? ???? ?? ???????? ?????????? ???? ????? ???????? ??????? ???????????   --------------------------------------------------------------------------  Nird??ana anus?ra priskrip?ana linuh?s. Sac?ta rahanuh?s ki y? au?adhi lid? tap?'?k? GI pathab??a raktasr?va hun? khatar? huncha. Yadi tap?'?? ragata b?nt? garna th?lnubhay? bhan?, am?dhy?r? mala v? kh?n? mala cha, kr?pay? tatk?la ?patak?l?na k??h?m? pharkanuh?s.  Kr?pay? khu???har? ba?h?'unuh?s s?jana kama garna maddata garna.  Kr?pay? bais?kh? pray?ga garnuh?s ra tap?'??k? khu???m? kunai bh?ra nalag?'unuh?s kinabhan? yasal? tap?'??k? ha??'??har? ?h?kasam?ga nik? p?rdaina.  Tap?'?nl?'? ?rth?p??ika ??k?aral?'? r?pharala di'iy?. Tap?'?nl? anugamanak? l?gi  ap?'im?n?a garna aphisa kala garnu parcha.  Kunai pani nay?m? v? bigram?d? lak?a?ahar?k? l?gi kr?pay? ?patak?l?na vibh?gam? pharkanuh?s.

## 2018-05-22 NOTE — ED Triage Notes (Signed)
Pt BIBA from home c/o left foot pain.  Pt largely non [place] speaking, speaks [place].

## 2018-06-08 ENCOUNTER — Emergency Department (HOSPITAL_COMMUNITY): Payer: Self-pay

## 2018-06-08 ENCOUNTER — Other Ambulatory Visit: Payer: Self-pay

## 2018-06-08 ENCOUNTER — Emergency Department (HOSPITAL_COMMUNITY)
Admission: EM | Admit: 2018-06-08 | Discharge: 2018-06-08 | Disposition: A | Discharge status: Home / Self Care | Payer: Self-pay | Attending: Emergency Medicine | Admitting: Emergency Medicine

## 2018-06-08 ENCOUNTER — Encounter (HOSPITAL_COMMUNITY): Payer: Self-pay | Admitting: Emergency Medicine

## 2018-06-08 DIAGNOSIS — Z79899 Other long term (current) drug therapy: Secondary | ICD-10-CM | POA: Insufficient documentation

## 2018-06-08 DIAGNOSIS — Y999 Unspecified external cause status: Secondary | ICD-10-CM | POA: Insufficient documentation

## 2018-06-08 DIAGNOSIS — W19XXXA Unspecified fall, initial encounter: Secondary | ICD-10-CM

## 2018-06-08 DIAGNOSIS — Y929 Unspecified place or not applicable: Secondary | ICD-10-CM | POA: Insufficient documentation

## 2018-06-08 DIAGNOSIS — Y939 Activity, unspecified: Secondary | ICD-10-CM | POA: Insufficient documentation

## 2018-06-08 DIAGNOSIS — S0101XA Laceration without foreign body of scalp, initial encounter: Secondary | ICD-10-CM

## 2018-06-08 DIAGNOSIS — F10920 Alcohol use, unspecified with intoxication, uncomplicated: Secondary | ICD-10-CM

## 2018-06-08 LAB — COMPREHENSIVE METABOLIC PANEL
ALT: 29 U/L (ref 0–44)
AST: 60 U/L — ABNORMAL HIGH (ref 15–41)
Albumin: 4.1 g/dL (ref 3.5–5.0)
Alkaline Phosphatase: 133 U/L — ABNORMAL HIGH (ref 38–126)
Anion gap: 15 (ref 5–15)
BUN: 11 mg/dL (ref 6–20)
CO2: 20 mmol/L — ABNORMAL LOW (ref 22–32)
Calcium: 8.7 mg/dL — ABNORMAL LOW (ref 8.9–10.3)
Chloride: 108 mmol/L (ref 98–111)
Creatinine, Ser: 0.75 mg/dL (ref 0.44–1.00)
GFR calc Af Amer: >60 mL/min (ref >60)
GFR calc non Af Amer: >60 mL/min (ref >60)
Glucose, Bld: 97 mg/dL (ref 70–99)
Potassium: 3.6 mmol/L (ref 3.5–5.1)
Sodium: 143 mmol/L (ref 135–145)
Total Bilirubin: 0.4 mg/dL (ref 0.3–1.2)
Total Protein: 8.3 g/dL — ABNORMAL HIGH (ref 6.5–8.1)

## 2018-06-08 LAB — CBC WITH DIFFERENTIAL/PLATELET
Abs Immature Granulocytes: 0.02 10*3/uL (ref 0.00–0.07)
Basophils Absolute: 0.1 10*3/uL (ref 0.0–0.1)
Basophils Relative: 1 %
Eosinophils Absolute: 0.1 10*3/uL (ref 0.0–0.5)
Eosinophils Relative: 1 %
HCT: 36 % (ref 36.0–46.0)
Hemoglobin: 11.1 g/dL — ABNORMAL LOW (ref 12.0–15.0)
Immature Granulocytes: 0 %
Lymphocytes Relative: 50 %
Lymphs Abs: 2.9 10*3/uL (ref 0.7–4.0)
MCH: 29.1 pg (ref 26.0–34.0)
MCHC: 30.8 g/dL (ref 30.0–36.0)
MCV: 94.5 fL (ref 80.0–100.0)
Monocytes Absolute: 0.5 10*3/uL (ref 0.1–1.0)
Monocytes Relative: 8 %
Neutro Abs: 2.4 10*3/uL (ref 1.7–7.7)
Neutrophils Relative %: 40 %
Platelets: 317 10*3/uL (ref 150–400)
RBC: 3.81 MIL/uL — ABNORMAL LOW (ref 3.87–5.11)
RDW: 19.9 % — ABNORMAL HIGH (ref 11.5–15.5)
WBC: 5.9 10*3/uL (ref 4.0–10.5)
nRBC: 0 % (ref 0.0–0.2)

## 2018-06-08 LAB — I-STAT BETA HCG BLOOD, ED (MC, WL, AP ONLY): I-stat hCG, quantitative: <5 m[IU]/mL (ref <5)

## 2018-06-08 MED ORDER — KETAMINE HCL 50 MG/5ML IJ SOSY
20.0000 mg | PREFILLED_SYRINGE | Freq: Once | INTRAMUSCULAR | Status: DC
  Filled 2018-06-08: qty 5

## 2018-06-08 MED ORDER — SODIUM CHLORIDE 0.9 % IV BOLUS
1000.0000 mL | Freq: Once | INTRAVENOUS | Status: AC
  Administered 2018-06-08: 1000 mL via INTRAVENOUS

## 2018-06-08 MED ORDER — LIDOCAINE-EPINEPHRINE (PF) 2 %-1:200000 IJ SOLN
10.0000 mL | Freq: Once | INTRAMUSCULAR | Status: AC
  Administered 2018-06-08: 10 mL via INTRADERMAL
  Filled 2018-06-08: qty 10

## 2018-06-08 NOTE — ED Notes (Signed)
Patient transported to CT 

## 2018-06-08 NOTE — ED Triage Notes (Signed)
Arrives via EMS, C/C fall, unknown time, mechanics. Laceration to posterior skull, c-collar applied. ETOH noted, cast on L leg from previous fall.

## 2018-06-08 NOTE — ED Notes (Signed)
Patient is extremely intoxicated-splint to left lower extremity ripped and filthy-c-collar in place/patient attempting to remove it-instructed not to remove

## 2018-06-08 NOTE — ED Notes (Signed)
Patient is very intoxicated and not cooperative with care-bloody head wrap/kling to back of head with bloody drainage-patient refuses c-collar-replaced x 3-patient removed and yelled "No' and threw it on the bed-patient undressed-very unkempt-clothes are dirty.

## 2018-06-08 NOTE — ED Notes (Signed)
Bed: GG83 Expected date:  Expected time:  Means of arrival:  Comments: EMS 43 yo female fall/intoxicated

## 2018-06-08 NOTE — ED Notes (Signed)
It had been reported to me that pt. Had phoned her family; and we were awaiting their arrival. However, when pt. Was just asked, she tells [place] "I will call them at 11". We ask her to please phone them now, to which she assents.

## 2018-06-08 NOTE — ED Provider Notes (Signed)
[facility]-EMERGENCY DEPT Provider Note  CSN: [id number] Arrival date & time: 06/08/18 0111  Chief Complaint(s) Fall and Head Laceration  HPI Susan Mcconnell is a 43 y.o. female with a history of alcoholism who presents to the emergency department for alcohol intoxication as well as fall with head trauma and scalp laceration.  Patient endorsed alcohol use.  Reports that she is not sure why she fell.  She is endorsing posterior scalp pain that is exacerbated with palpation.  She denies any neck pain, back pain.  Denies any other physical complaints.  HPI  Past Medical History Past Medical History:  Diagnosis Date   Alcoholic hepatitis    Pneumonia    TB (tuberculosis) ~ 2017   "took the RX and was healed" (05/04/2017)   Patient Active Problem List   Diagnosis Date Noted   Alcohol abuse 04/03/2018   MDD (major depressive disorder), severe (HCC) 03/24/2018   Alcohol use disorder, severe, dependence (HCC)    Hypernatremia 08/15/2017   Suicidal ideation 08/15/2017   Alcohol intoxication (HCC) 08/15/2017   Sepsis due to pneumonia (HCC) 06/24/2017   Alcoholic intoxication without complication (HCC)    HCAP (healthcare-associated pneumonia)    Hypotension due to hypovolemia    Hepatitis    Hypertriglyceridemia 05/06/2017   Compensated metabolic acidosis 05/06/2017   Pancytopenia (HCC)    Thrombocytopenia (HCC) 05/04/2017   Hypokalemia 05/04/2017   Abnormal LFTs 05/04/2017   Chest pain 05/04/2017   Protein-calorie malnutrition, moderate (HCC) 05/04/2017   Elevated liver enzymes    Home Medication(s) Prior to Admission medications   Medication Sig Start Date End Date Taking? Authorizing Provider  FLUoxetine (PROZAC) 20 MG capsule Take 1 capsule (20 mg total) by mouth daily. For mood 04/02/18   Malvin Johns, MD  nicotine (NICODERM CQ - DOSED IN MG/24 HOURS) 21 mg/24hr patch Place 1 patch (21 mg total) onto the skin daily. For smoking cessation  03/29/18   Aldean Baker, NP  Prenatal Vit-Fe Fumarate-FA (PRENATAL MULTIVITAMIN) TABS tablet Take 1 tablet by mouth daily. 04/02/18   Malvin Johns, MD  thiamine 100 MG tablet Take 1 tablet (100 mg total) by mouth daily. Patient not taking: Reported on 03/23/2018 08/18/17   Elgergawy, Leana Roe, MD  traZODone (DESYREL) 100 MG tablet Take 1 tablet (100 mg total) by mouth at bedtime as needed for sleep. 04/02/18   Malvin Johns, MD                                                                                                                                    Past Surgical History Past Surgical History:  Procedure Laterality Date   OTHER SURGICAL HISTORY  2010   "birth control surgery; don't know what they did"   Family History Family History  Family history unknown: Yes    Social History Social History   Tobacco Use   Smoking status: Never Smoker   Smokeless tobacco:  Current User    Types: Chew  Substance Use Topics   Alcohol use: Yes    Alcohol/week: 21.0 standard drinks    Types: 21 Cans of beer per week    Comment: 05/04/2017 "1 can, 3 times/day"   Drug use: Never   Allergies Patient has no known allergies.  Review of Systems Review of Systems All other systems are reviewed and are negative for acute change except as noted in the HPI  Physical Exam Vital Signs  I have reviewed the triage vital signs BP 103/74 (BP Location: Right Arm)    Pulse 93    Temp 98 F (36.7 C)    Resp 16    SpO2 100%   Physical Exam Constitutional:      General: She is not in acute distress.    Appearance: She is well-developed. She is not diaphoretic.     Interventions: Cervical collar in place.    HENT:     Head: Normocephalic and atraumatic.      Right Ear: External ear normal.     Left Ear: External ear normal.     Nose: Nose normal.  Eyes:     General: No scleral icterus.       Right eye: No discharge.        Left eye: No discharge.     Conjunctiva/sclera: Conjunctivae normal.       Pupils: Pupils are equal, round, and reactive to light.  Neck:     Musculoskeletal: Normal range of motion and neck supple.  Cardiovascular:     Rate and Rhythm: Normal rate and regular rhythm.     Pulses:          Radial pulses are 2+ on the right side and 2+ on the left side.       Dorsalis pedis pulses are 2+ on the right side and 2+ on the left side.     Heart sounds: Normal heart sounds. No murmur. No friction rub. No gallop.   Pulmonary:     Effort: Pulmonary effort is normal. No respiratory distress.     Breath sounds: Normal breath sounds. No stridor. No wheezing.  Abdominal:     General: There is no distension.     Palpations: Abdomen is soft.     Tenderness: There is no abdominal tenderness.  Musculoskeletal:        General: No tenderness.     Cervical back: She exhibits no bony tenderness.     Thoracic back: She exhibits no bony tenderness.     Lumbar back: She exhibits no bony tenderness.     Comments: Clavicles stable. Chest stable to AP/Lat compression. Pelvis stable to Lat compression. No obvious extremity deformity. No chest or abdominal wall contusion.  Skin:    General: Skin is warm and dry.     Findings: No erythema or rash.  Neurological:     Mental Status: She is alert and oriented to person, place, and time.     Comments: Moving all extremities     ED Results and Treatments Labs (all labs ordered are listed, but only abnormal results are displayed) Labs Reviewed  CBC WITH DIFFERENTIAL/PLATELET - Abnormal; Notable for the following components:      Result Value   RBC 3.81 (*)    Hemoglobin 11.1 (*)    RDW 19.9 (*)    All other components within normal limits  COMPREHENSIVE METABOLIC PANEL - Abnormal; Notable for the following components:   CO2 20 (*)  Calcium 8.7 (*)    Total Protein 8.3 (*)    AST 60 (*)    Alkaline Phosphatase 133 (*)    All other components within normal limits  I-STAT BETA HCG BLOOD, ED ([facility], [facility], [facility] ONLY)                                                                                                                          EKG  EKG Interpretation  Date/Time:  Saturday Jun 08 2018 01:48:04 EDT Ventricular Rate:  108 PR Interval:    QRS Duration: 84 QT Interval:  332 QTC Calculation: 445 R Axis:   60 Text Interpretation:  Sinus tachycardia Otherwise no significant change Confirmed by Drema Pry [phone]) on 06/08/2018 1:50:47 AM      Radiology Ct Head Wo Contrast  Result Date: 06/08/2018 CLINICAL DATA:  43 y/o  F; fall with head and neck trauma. ETOH. EXAM: CT HEAD WITHOUT CONTRAST CT CERVICAL SPINE WITHOUT CONTRAST TECHNIQUE: Multidetector CT imaging of the head and cervical spine was performed following the standard protocol without intravenous contrast. Multiplanar CT image reconstructions of the cervical spine were also generated. COMPARISON:  02/03/2018 CT head. FINDINGS: CT HEAD FINDINGS Brain: No evidence of acute infarction, hemorrhage, hydrocephalus, extra-axial collection or mass lesion/mass effect. Vascular: No hyperdense vessel or unexpected calcification. Skull: Left occipital and right parietal scalp contusions. Left occipital scalp laceration. Sinuses/Orbits: No acute finding. Other: None. CT CERVICAL SPINE FINDINGS Alignment: The head and atlas are rotated rightward with respect to the axis and lower cervical spine. There is anterior motion of the left lateral atlantoaxial joint. Normal anterior C1-2 articulation and posterior spinal line. Skull base and vertebrae: No acute fracture. No primary bone lesion or focal pathologic process. No prevertebral soft tissue thickening or effacement of fat planes at the skull base or surrounded the atlantoaxial joints. Soft tissues and spinal canal: No prevertebral fluid or swelling. No visible canal hematoma. Disc levels:  Negative. Upper chest: Negative. Other: Negative. IMPRESSION: CT head: 1. Left occipital and right parietal scalp contusions. Left  occipital scalp laceration. No calvarial fracture. 2. No acute intracranial abnormality. CT cervical spine 1. No acute fracture of the cervical spine. 2. Significant rightward rotation of the head and the axis relative to the atlas and lower cervical spine. No associated edema is identified. Given exaggerated patient positioning, atlantoaxial stability and alignment cannot be accurately assessed. Consider repeat imaging when patient is able to cooperate with positioning. These results were called by telephone at the time of interpretation on 06/08/2018 at 2:56 am to Dr. Drema Pry , who verbally acknowledged these results. Electronically Signed   By: Mitzi Hansen M.D.   On: 06/08/2018 02:59   Ct Cervical Spine Wo Contrast  Result Date: 06/08/2018 CLINICAL DATA:  43 y/o F; neck pain, head trauma, ataxia. Repeat imaging to assess alignment. EXAM: CT CERVICAL SPINE WITHOUT CONTRAST TECHNIQUE: Multidetector CT imaging of the cervical spine was performed without intravenous contrast. Multiplanar CT image  reconstructions were also generated. COMPARISON:  None. FINDINGS: Alignment: Straightening of cervical lordosis with slight reversal at the C4-5 level. No listhesis. C1-2 articulation within normal limits. Skull base and vertebrae: No acute fracture. No primary bone lesion or focal pathologic process. Soft tissues and spinal canal: No prevertebral fluid or swelling. No visible canal hematoma. Disc levels:  Negative. Upper chest: Biapical pleuroparenchymal scarring, partially visualized. Other: None. IMPRESSION: Negative CT of the cervical spine. Electronically Signed   By: Mitzi HansenLance  Furusawa-Stratton M.D.   On: 06/08/2018 04:40   Ct Cervical Spine Wo Contrast  Result Date: 06/08/2018 CLINICAL DATA:  43 y/o  F; fall with head and neck trauma. ETOH. EXAM: CT HEAD WITHOUT CONTRAST CT CERVICAL SPINE WITHOUT CONTRAST TECHNIQUE: Multidetector CT imaging of the head and cervical spine was performed following the  standard protocol without intravenous contrast. Multiplanar CT image reconstructions of the cervical spine were also generated. COMPARISON:  02/03/2018 CT head. FINDINGS: CT HEAD FINDINGS Brain: No evidence of acute infarction, hemorrhage, hydrocephalus, extra-axial collection or mass lesion/mass effect. Vascular: No hyperdense vessel or unexpected calcification. Skull: Left occipital and right parietal scalp contusions. Left occipital scalp laceration. Sinuses/Orbits: No acute finding. Other: None. CT CERVICAL SPINE FINDINGS Alignment: The head and atlas are rotated rightward with respect to the axis and lower cervical spine. There is anterior motion of the left lateral atlantoaxial joint. Normal anterior C1-2 articulation and posterior spinal line. Skull base and vertebrae: No acute fracture. No primary bone lesion or focal pathologic process. No prevertebral soft tissue thickening or effacement of fat planes at the skull base or surrounded the atlantoaxial joints. Soft tissues and spinal canal: No prevertebral fluid or swelling. No visible canal hematoma. Disc levels:  Negative. Upper chest: Negative. Other: Negative. IMPRESSION: CT head: 1. Left occipital and right parietal scalp contusions. Left occipital scalp laceration. No calvarial fracture. 2. No acute intracranial abnormality. CT cervical spine 1. No acute fracture of the cervical spine. 2. Significant rightward rotation of the head and the axis relative to the atlas and lower cervical spine. No associated edema is identified. Given exaggerated patient positioning, atlantoaxial stability and alignment cannot be accurately assessed. Consider repeat imaging when patient is able to cooperate with positioning. These results were called by telephone at the time of interpretation on 06/08/2018 at 2:56 am to Dr. Drema PryPEDRO Graysin Luczynski , who verbally acknowledged these results. Electronically Signed   By: Mitzi HansenLance  Furusawa-Stratton M.D.   On: 06/08/2018 02:59   Pertinent  labs & imaging results that were available during my care of the patient were reviewed by me and considered in my medical decision making (see chart for details).  Medications Ordered in ED Medications  ketamine 50 mg in normal saline 5 mL (10 mg/mL) syringe (20 mg Intravenous Not Given 06/08/18 0420)  lidocaine-EPINEPHrine (XYLOCAINE W/EPI) 2 %-1:200000 (PF) injection 10 mL (10 mLs Intradermal Given 06/08/18 0139)  sodium chloride 0.9 % bolus 1,000 mL (0 mLs Intravenous Stopped 06/08/18 0535)  Procedures .Marland KitchenLaceration Repair Date/Time: 06/08/2018 5:24 AM Performed by: Nira Conn, MD Authorized by: Nira Conn, MD   Consent:    Consent obtained:  Verbal   Consent given by:  Patient   Risks discussed:  Nerve damage and poor wound healing   Alternatives discussed:  No treatment Anesthesia (see MAR for exact dosages):    Anesthesia method:  Local infiltration   Local anesthetic:  Lidocaine 2% WITH epi Laceration details:    Location:  Scalp   Scalp location:  Occipital   Length (cm):  1.5   Depth (mm):  4 Repair type:    Repair type:  Simple Pre-procedure details:    Preparation:  Patient was prepped and draped in usual sterile fashion and imaging obtained to evaluate for foreign bodies Exploration:    Wound extent: no foreign bodies/material noted     Contaminated: no   Treatment:    Area cleansed with:  Betadine   Amount of cleaning:  Standard   Irrigation solution:  Sterile saline   Irrigation volume:  500cc   Irrigation method:  Pressure wash   Visualized foreign bodies/material removed: no   Skin repair:    Repair method:  Staples   Number of staples:  1 Approximation:    Approximation:  Close Post-procedure details:    Patient tolerance of procedure:  Tolerated well, no immediate complications    (including critical care  time)  Medical Decision Making / ED Course I have reviewed the nursing notes for this encounter and the patient's prior records (if available in EHR or on provided paperwork).     EtOH resulting in fall and head trauma.  CT head negative.  CT cervical spine inconclusive due to positioning.  Patient was uncooperative initially but was allowed to metabolize and allowed [place] to re-CT her cervical spine which was negative.  Labs reassuring.  Laceration thoroughly irrigated and closed as above patient allowed to metabolize.  Tolerating oral intake.  The patient appears reasonably screened and/or stabilized for discharge and I doubt any other medical condition or other [facility] requiring further screening, evaluation, or treatment in the ED at this time prior to discharge.  The patient is safe for discharge with strict return precautions.   Final Clinical Impression(s) / ED Diagnoses Final diagnoses:  Alcoholic intoxication without complication (HCC)  Fall, initial encounter  Laceration of scalp, initial encounter   Disposition: Discharge to family  Condition: Good    ED Discharge Orders    None       Follow Up: Primary care provider  Schedule an appointment as soon as possible for a visit  As needed  [facility] [address] [id number] mc [place] [address] [phone]  in 5-7 days for staple removal      This chart was dictated using voice recognition software.  Despite best efforts to proofread,  errors can occur which can change the documentation meaning.   Nira Conn, MD 06/08/18 [phone]

## 2018-06-09 ENCOUNTER — Emergency Department (HOSPITAL_COMMUNITY)
Admission: EM | Admit: 2018-06-09 | Discharge: 2018-06-10 | Disposition: A | Discharge status: Home / Self Care | Payer: Self-pay | Attending: Emergency Medicine | Admitting: Emergency Medicine

## 2018-06-09 ENCOUNTER — Emergency Department (HOSPITAL_COMMUNITY): Payer: Self-pay

## 2018-06-09 ENCOUNTER — Other Ambulatory Visit: Payer: Self-pay

## 2018-06-09 ENCOUNTER — Encounter (HOSPITAL_COMMUNITY): Payer: Self-pay | Admitting: *Deleted

## 2018-06-09 DIAGNOSIS — Z79899 Other long term (current) drug therapy: Secondary | ICD-10-CM | POA: Insufficient documentation

## 2018-06-09 DIAGNOSIS — M25572 Pain in left ankle and joints of left foot: Secondary | ICD-10-CM | POA: Insufficient documentation

## 2018-06-09 DIAGNOSIS — X58XXXD Exposure to other specified factors, subsequent encounter: Secondary | ICD-10-CM | POA: Insufficient documentation

## 2018-06-09 DIAGNOSIS — S0003XD Contusion of scalp, subsequent encounter: Secondary | ICD-10-CM | POA: Insufficient documentation

## 2018-06-09 DIAGNOSIS — T148XXA Other injury of unspecified body region, initial encounter: Secondary | ICD-10-CM

## 2018-06-09 MED ORDER — ACETAMINOPHEN 325 MG PO TABS
650.0000 mg | ORAL_TABLET | Freq: Once | ORAL | Status: AC
  Administered 2018-06-10: 650 mg via ORAL
  Filled 2018-06-09: qty 2

## 2018-06-09 NOTE — ED Notes (Signed)
Bed: TT01 Expected date:  Expected time:  Means of arrival:  Comments: 25F ETOH, messing with her staples

## 2018-06-09 NOTE — ED Triage Notes (Signed)
Pt fell a day ago and hit her head. Pt was evaluated and put stables on the head.  Pt has been consuming ETOH to the point that her staples are bleeding.  EMS reports that bleeding was controlled on their arrival.  Pt also pointing to her left leg but EMS did not note any obvious injuries.  Pt speak [name].

## 2018-06-10 NOTE — ED Provider Notes (Signed)
[facility]-EMERGENCY DEPT Provider Note   CSN: [id number] Arrival date & time: 06/09/18  2249    History   Chief Complaint Chief Complaint  Patient presents with  . Alcohol Intoxication    HPI Susan Mcconnell is a 43 y.o. female.     HPI  Translation service was utilized  43 year old with history of alcoholic hepatitis comes in with chief complaint of ankle pain and bleeding from her scalp. Patient was seen in the ER 2 days ago with a fall which resulted in a scalp hematoma.  She had 1 staple applied to her wound.  She reports that she was bleeding from the site which prompted her to come to the ER.  She also reports to [place] that she had an ankle injury few days ago and was put in a splint.  She has removed the splint without following up with orthopedist.  She also has been ambulating on the foot.  She wants [place] to reassess her injury.  She informs me that she did not follow-up with the orthopedist because she does not have the money.  Past Medical History:  Diagnosis Date  . Alcoholic hepatitis   . Pneumonia   . TB (tuberculosis) ~ 2017   "took the RX and was healed" (05/04/2017)    Patient Active Problem List   Diagnosis Date Noted  . Alcohol abuse 04/03/2018  . MDD (major depressive disorder), severe (HCC) 03/24/2018  . Alcohol use disorder, severe, dependence (HCC)   . Hypernatremia 08/15/2017  . Suicidal ideation 08/15/2017  . Alcohol intoxication (HCC) 08/15/2017  . Sepsis due to pneumonia (HCC) 06/24/2017  . Alcoholic intoxication without complication (HCC)   . HCAP (healthcare-associated pneumonia)   . Hypotension due to hypovolemia   . Hepatitis   . Hypertriglyceridemia 05/06/2017  . Compensated metabolic acidosis 05/06/2017  . Pancytopenia (HCC)   . Thrombocytopenia (HCC) 05/04/2017  . Hypokalemia 05/04/2017  . Abnormal LFTs 05/04/2017  . Chest pain 05/04/2017  . Protein-calorie malnutrition, moderate (HCC) 05/04/2017  . Elevated liver  enzymes     Past Surgical History:  Procedure Laterality Date  . OTHER SURGICAL HISTORY  2010   "birth control surgery; don't know what they did"     OB History   No obstetric history on file.      Home Medications    Prior to Admission medications   Medication Sig Start Date End Date Taking? Authorizing Provider  FLUoxetine (PROZAC) 20 MG capsule Take 1 capsule (20 mg total) by mouth daily. For mood 04/02/18   Malvin Johns, MD  nicotine (NICODERM CQ - DOSED IN MG/24 HOURS) 21 mg/24hr patch Place 1 patch (21 mg total) onto the skin daily. For smoking cessation 03/29/18   Aldean Baker, NP  Prenatal Vit-Fe Fumarate-FA (PRENATAL MULTIVITAMIN) TABS tablet Take 1 tablet by mouth daily. 04/02/18   Malvin Johns, MD  thiamine 100 MG tablet Take 1 tablet (100 mg total) by mouth daily. Patient not taking: Reported on 03/23/2018 08/18/17   Elgergawy, Leana Roe, MD  traZODone (DESYREL) 100 MG tablet Take 1 tablet (100 mg total) by mouth at bedtime as needed for sleep. 04/02/18   Malvin Johns, MD    Family History Family History  Family history unknown: Yes    Social History Social History   Tobacco Use  . Smoking status: Never Smoker  . Smokeless tobacco: Current User    Types: Chew  Substance Use Topics  . Alcohol use: Yes    Alcohol/week: 21.0 standard  drinks    Types: 21 Cans of beer per week    Comment: 05/04/2017 "1 can, 3 times/day"  . Drug use: Never     Allergies   Patient has no known allergies.   Review of Systems Review of Systems  Constitutional: Positive for activity change.  Skin: Positive for wound.  Neurological: Positive for headaches.  Hematological: Does not bruise/bleed easily.     Physical Exam Updated Vital Signs BP 102/72 (BP Location: Left Arm)   Pulse 91   Temp (!) 96.7 F (35.9 C) (Axillary)   Resp 16   LMP 06/09/2018   SpO2 99%   Physical Exam Vitals signs and nursing note reviewed.  Constitutional:      Appearance: She is well-developed.   HENT:     Head: Normocephalic and atraumatic.     Comments: Patient has a large hematoma over the occiput. There is 1 staple in place, with evidence of surrounding bleed.  No active bleeding appreciated. Neck:     Musculoskeletal: Normal range of motion and neck supple.  Cardiovascular:     Rate and Rhythm: Normal rate.  Pulmonary:     Effort: Pulmonary effort is normal.  Abdominal:     General: Bowel sounds are normal.  Musculoskeletal:        General: No swelling or deformity.     Comments: Patient has tenderness over the anterior aspect of the ankle  Skin:    General: Skin is warm and dry.  Neurological:     Mental Status: She is alert and oriented to person, place, and time.      ED Treatments / Results  Labs (all labs ordered are listed, but only abnormal results are displayed) Labs Reviewed - No data to display  EKG None  Radiology Dg Ankle 2 Views Left  Result Date: 06/10/2018 CLINICAL DATA:  Recent fall with left ankle pain, initial encounter EXAM: LEFT ANKLE - 2 VIEW COMPARISON:  05/22/2018 FINDINGS: No acute fracture or dislocation is noted. The known second metatarsal fracture is not well appreciated on these films. No gross soft tissue abnormality is seen. IMPRESSION: No acute abnormality noted. Electronically Signed   By: Alcide CleverMark  Lukens M.D.   On: 06/10/2018 00:00   Ct Head Wo Contrast  Result Date: 06/08/2018 CLINICAL DATA:  43 y/o  F; fall with head and neck trauma. ETOH. EXAM: CT HEAD WITHOUT CONTRAST CT CERVICAL SPINE WITHOUT CONTRAST TECHNIQUE: Multidetector CT imaging of the head and cervical spine was performed following the standard protocol without intravenous contrast. Multiplanar CT image reconstructions of the cervical spine were also generated. COMPARISON:  02/03/2018 CT head. FINDINGS: CT HEAD FINDINGS Brain: No evidence of acute infarction, hemorrhage, hydrocephalus, extra-axial collection or mass lesion/mass effect. Vascular: No hyperdense vessel or  unexpected calcification. Skull: Left occipital and right parietal scalp contusions. Left occipital scalp laceration. Sinuses/Orbits: No acute finding. Other: None. CT CERVICAL SPINE FINDINGS Alignment: The head and atlas are rotated rightward with respect to the axis and lower cervical spine. There is anterior motion of the left lateral atlantoaxial joint. Normal anterior C1-2 articulation and posterior spinal line. Skull base and vertebrae: No acute fracture. No primary bone lesion or focal pathologic process. No prevertebral soft tissue thickening or effacement of fat planes at the skull base or surrounded the atlantoaxial joints. Soft tissues and spinal canal: No prevertebral fluid or swelling. No visible canal hematoma. Disc levels:  Negative. Upper chest: Negative. Other: Negative. IMPRESSION: CT head: 1. Left occipital and right parietal scalp  contusions. Left occipital scalp laceration. No calvarial fracture. 2. No acute intracranial abnormality. CT cervical spine 1. No acute fracture of the cervical spine. 2. Significant rightward rotation of the head and the axis relative to the atlas and lower cervical spine. No associated edema is identified. Given exaggerated patient positioning, atlantoaxial stability and alignment cannot be accurately assessed. Consider repeat imaging when patient is able to cooperate with positioning. These results were called by telephone at the time of interpretation on 06/08/2018 at 2:56 am to Dr. Drema Pry , who verbally acknowledged these results. Electronically Signed   By: Mitzi Hansen M.D.   On: 06/08/2018 02:59   Ct Cervical Spine Wo Contrast  Result Date: 06/08/2018 CLINICAL DATA:  43 y/o F; neck pain, head trauma, ataxia. Repeat imaging to assess alignment. EXAM: CT CERVICAL SPINE WITHOUT CONTRAST TECHNIQUE: Multidetector CT imaging of the cervical spine was performed without intravenous contrast. Multiplanar CT image reconstructions were also generated.  COMPARISON:  None. FINDINGS: Alignment: Straightening of cervical lordosis with slight reversal at the C4-5 level. No listhesis. C1-2 articulation within normal limits. Skull base and vertebrae: No acute fracture. No primary bone lesion or focal pathologic process. Soft tissues and spinal canal: No prevertebral fluid or swelling. No visible canal hematoma. Disc levels:  Negative. Upper chest: Biapical pleuroparenchymal scarring, partially visualized. Other: None. IMPRESSION: Negative CT of the cervical spine. Electronically Signed   By: Mitzi Hansen M.D.   On: 06/08/2018 04:40   Ct Cervical Spine Wo Contrast  Result Date: 06/08/2018 CLINICAL DATA:  43 y/o  F; fall with head and neck trauma. ETOH. EXAM: CT HEAD WITHOUT CONTRAST CT CERVICAL SPINE WITHOUT CONTRAST TECHNIQUE: Multidetector CT imaging of the head and cervical spine was performed following the standard protocol without intravenous contrast. Multiplanar CT image reconstructions of the cervical spine were also generated. COMPARISON:  02/03/2018 CT head. FINDINGS: CT HEAD FINDINGS Brain: No evidence of acute infarction, hemorrhage, hydrocephalus, extra-axial collection or mass lesion/mass effect. Vascular: No hyperdense vessel or unexpected calcification. Skull: Left occipital and right parietal scalp contusions. Left occipital scalp laceration. Sinuses/Orbits: No acute finding. Other: None. CT CERVICAL SPINE FINDINGS Alignment: The head and atlas are rotated rightward with respect to the axis and lower cervical spine. There is anterior motion of the left lateral atlantoaxial joint. Normal anterior C1-2 articulation and posterior spinal line. Skull base and vertebrae: No acute fracture. No primary bone lesion or focal pathologic process. No prevertebral soft tissue thickening or effacement of fat planes at the skull base or surrounded the atlantoaxial joints. Soft tissues and spinal canal: No prevertebral fluid or swelling. No visible canal  hematoma. Disc levels:  Negative. Upper chest: Negative. Other: Negative. IMPRESSION: CT head: 1. Left occipital and right parietal scalp contusions. Left occipital scalp laceration. No calvarial fracture. 2. No acute intracranial abnormality. CT cervical spine 1. No acute fracture of the cervical spine. 2. Significant rightward rotation of the head and the axis relative to the atlas and lower cervical spine. No associated edema is identified. Given exaggerated patient positioning, atlantoaxial stability and alignment cannot be accurately assessed. Consider repeat imaging when patient is able to cooperate with positioning. These results were called by telephone at the time of interpretation on 06/08/2018 at 2:56 am to Dr. Drema Pry , who verbally acknowledged these results. Electronically Signed   By: Mitzi Hansen M.D.   On: 06/08/2018 02:59    Procedures Procedures (including critical care time)  Medications Ordered in ED Medications  acetaminophen (TYLENOL) tablet 650  mg (650 mg Oral Given 06/10/18 0003)     Initial Impression / Assessment and Plan / ED Course  I have reviewed the triage vital signs and the nursing notes.  Pertinent labs & imaging results that were available during my care of the patient were reviewed by me and considered in my medical decision making (see chart for details).        43 year old woman with history of alcoholism and recent injury to her left foot and also her scalp comes in with chief complaint of bleeding from her scalp wound site. The wound has no active bleeding.  We will apply sterile gauze and Kerlix.  As far as her foot is concerned, patient has not follow-up with orthopedic surgeon has been walking on the foot.  Her x-rays from April revealed possible second metatarsal fracture.  There was no ankle injury appreciated at that time.  Patient's main complaint is ankle pain.  X-rays ordered during this visit, and they continue to show no  evidence of fracture or healing around the ankle side.  We will put her in a postop boot and ASO splint, which is likely more than adequate given that she does not want to put in a splint.  She has been advised to continue using the crutches as needed and follow-up with the orthopedic doctors    Final Clinical Impressions(s) / ED Diagnoses   Final diagnoses:  Hematoma  Left ankle pain, unspecified chronicity    ED Discharge Orders    None       Derwood Kaplan, MD 06/10/18 0013

## 2018-06-10 NOTE — ED Notes (Signed)
Son called and states he can pick up his mom from ED in 30 minutes.

## 2018-06-10 NOTE — Discharge Instructions (Addendum)
Please keep the splints on at all time and use the postop boot when you are walking. You are provided with crutches, use crutches as needed.  It is important that you follow-up with the orthopedic doctors as requested. The staples can be removed in 1 week.  Return to the ER for staple removal.

## 2018-06-18 ENCOUNTER — Other Ambulatory Visit: Payer: Self-pay

## 2018-06-18 ENCOUNTER — Emergency Department (HOSPITAL_COMMUNITY)
Admission: EM | Admit: 2018-06-18 | Discharge: 2018-06-18 | Disposition: A | Discharge status: Home / Self Care | Payer: Self-pay | Attending: Emergency Medicine | Admitting: Emergency Medicine

## 2018-06-18 ENCOUNTER — Encounter (HOSPITAL_COMMUNITY): Payer: Self-pay | Admitting: Emergency Medicine

## 2018-06-18 DIAGNOSIS — Z4802 Encounter for removal of sutures: Secondary | ICD-10-CM | POA: Insufficient documentation

## 2018-06-18 NOTE — ED Notes (Signed)
After extensive D/C instructions per  Interpreter line Susan Mcconnell pt stattes understanding of keeping her wound on her head clean and her hair washed , pt given work note,

## 2018-06-18 NOTE — Discharge Instructions (Addendum)
Your wound appears to be healing well. Continue to keep the area clean and dry.   May apply ointments or lotions such as Aquaphor to the area to reduce scarring. The key is to keep the skin well hydrated and supple. It is also important to stay well hydrated by drinking plenty of water. Keep your scar protected from the sun. Cover the scar with sunscreen that has an SPF (sun protection factor) of 30 or higher. Do not put sunscreen on your scar until it has healed. Gently massage the scar using a circular motion. This will help to minimize the appearance of the scar. Do this only after the incision has closed and all of the sutures have been removed. Remember that the scar may appear lighter or darker than your normal skin color. This difference in color should even out with time. If your scar does not fade or go away with time and you do not like how it looks, consider talking with a plastic surgeon or a dermatologist.  

## 2018-06-18 NOTE — ED Triage Notes (Signed)
Pt in for staple removal of posterior head, placed on 5/10

## 2018-06-18 NOTE — ED Provider Notes (Signed)
[facility] Provider Note   CSN: [id number] Arrival date & time: 06/18/18  [id number]    History   Chief Complaint Chief Complaint  Patient presents with  . Suture / Staple Removal    HPI Susan Mcconnell is a 43 y.o. female.     A language interpreter was used [name]).    Susan Mcconnell is a 43 y.o. female, with a history of alcohol abuse, presenting to the ED for staple removal.  Patient had a wound to the occipital region with staple repair performed May 9.  She notes intermittent, mild, spontaneously resolving headaches since the incident.  Denies fever, dizziness, syncope, neuro deficits, nausea/vomiting, swelling, increased pain to the wound, purulence, neck pain, or any other complaints.   Past Medical History:  Diagnosis Date  . Alcoholic hepatitis   . Pneumonia   . TB (tuberculosis) ~ 2017   "took the RX and was healed" (05/04/2017)    Patient Active Problem List   Diagnosis Date Noted  . Alcohol abuse 04/03/2018  . MDD (major depressive disorder), severe (HCC) 03/24/2018  . Alcohol use disorder, severe, dependence (HCC)   . Hypernatremia 08/15/2017  . Suicidal ideation 08/15/2017  . Alcohol intoxication (HCC) 08/15/2017  . Sepsis due to pneumonia (HCC) 06/24/2017  . Alcoholic intoxication without complication (HCC)   . HCAP (healthcare-associated pneumonia)   . Hypotension due to hypovolemia   . Hepatitis   . Hypertriglyceridemia 05/06/2017  . Compensated metabolic acidosis 05/06/2017  . Pancytopenia (HCC)   . Thrombocytopenia (HCC) 05/04/2017  . Hypokalemia 05/04/2017  . Abnormal LFTs 05/04/2017  . Chest pain 05/04/2017  . Protein-calorie malnutrition, moderate (HCC) 05/04/2017  . Elevated liver enzymes     Past Surgical History:  Procedure Laterality Date  . OTHER SURGICAL HISTORY  2010   "birth control surgery; don't know what they did"     OB History   No obstetric history on file.      Home Medications     Prior to Admission medications   Medication Sig Start Date End Date Taking? Authorizing Provider  FLUoxetine (PROZAC) 20 MG capsule Take 1 capsule (20 mg total) by mouth daily. For mood 04/02/18   Malvin JohnsFarah, Brian, MD  nicotine (NICODERM CQ - DOSED IN MG/24 HOURS) 21 mg/24hr patch Place 1 patch (21 mg total) onto the skin daily. For smoking cessation 03/29/18   Aldean BakerSykes, Janet E, NP  Prenatal Vit-Fe Fumarate-FA (PRENATAL MULTIVITAMIN) TABS tablet Take 1 tablet by mouth daily. 04/02/18   Malvin JohnsFarah, Brian, MD  thiamine 100 MG tablet Take 1 tablet (100 mg total) by mouth daily. Patient not taking: Reported on 03/23/2018 08/18/17   Elgergawy, Leana Roeawood S, MD  traZODone (DESYREL) 100 MG tablet Take 1 tablet (100 mg total) by mouth at bedtime as needed for sleep. 04/02/18   Malvin JohnsFarah, Brian, MD    Family History Family History  Family history unknown: Yes    Social History Social History   Tobacco Use  . Smoking status: Never Smoker  . Smokeless tobacco: Current User    Types: Chew  Substance Use Topics  . Alcohol use: Yes    Alcohol/week: 21.0 standard drinks    Types: 21 Cans of beer per week    Comment: 05/04/2017 "1 can, 3 times/day"  . Drug use: Never     Allergies   Patient has no known allergies.   Review of Systems Review of Systems  Constitutional: Negative for fever.  Gastrointestinal: Negative for nausea and vomiting.  Musculoskeletal:  Negative for neck pain.  Skin: Positive for wound.  Neurological: Positive for headaches. Negative for dizziness, seizures, syncope, weakness, light-headedness and numbness.  Psychiatric/Behavioral: Negative for confusion.     Physical Exam Updated Vital Signs BP 108/86   Pulse (!) 104   Temp 98.5 F (36.9 C)   Resp 16   Wt 45.8 kg   LMP 06/09/2018   SpO2 99%   BMI 20.39 kg/m   Physical Exam Vitals signs and nursing note reviewed.  Constitutional:      General: She is not in acute distress.    Appearance: She is well-developed. She is not  diaphoretic.  HENT:     Head: Normocephalic.     Comments: Wound to the left occipital region.  Crusting, old blood is present.  I was able to remove some of this crusting, until patient requested I stop.  No noted purulence.  She does appear to still have some healing hematoma.  She states the region is not any bigger than it was originally. Eyes:     Conjunctiva/sclera: Conjunctivae normal.  Neck:     Musculoskeletal: Neck supple.  Cardiovascular:     Rate and Rhythm: Normal rate and regular rhythm.  Pulmonary:     Effort: Pulmonary effort is normal.  Musculoskeletal:     Comments: Normal motor function intact in all extremities. No midline spinal tenderness.   Skin:    General: Skin is warm and dry.     Coloration: Skin is not pale.  Neurological:     Mental Status: She is alert and oriented to person, place, and time.     Comments: Sensation grossly intact to light touch in the extremities.  Grip strengths equal bilaterally.  Strength 5/5 in all extremities. No gait disturbance. Coordination intact. Cranial nerves III-XII grossly intact. No facial droop.   Psychiatric:        Behavior: Behavior normal.      ED Treatments / Results  Labs (all labs ordered are listed, but only abnormal results are displayed) Labs Reviewed - No data to display  EKG None  Radiology No results found.  Procedures .Suture Removal Date/Time: 06/18/2018 9:45 AM Performed by: Anselm Pancoast, PA-C Authorized by: Anselm Pancoast, PA-C   Consent:    Consent obtained:  Verbal   Consent given by:  Patient   Risks discussed:  Bleeding, pain and wound separation Location:    Location:  Head/neck   Head/neck location:  Scalp Procedure details:    Wound appearance:  No signs of infection and good wound healing   Number of staples removed:  1 Post-procedure details:    Post-removal:  No dressing applied   Patient tolerance of procedure:  Tolerated well, no immediate complications   (including  critical care time)  Medications Ordered in ED Medications - No data to display   Initial Impression / Assessment and Plan / ED Course  I have reviewed the triage vital signs and the nursing notes.  Pertinent labs & imaging results that were available during my care of the patient were reviewed by me and considered in my medical decision making (see chart for details).        Patient presents for staple removal from a scalp wound.  She has had no signs of infection.  Mild, intermittent headaches, but no additional symptoms to suggest severe TBI. The patient was given instructions for continued home care as well as return precautions. Patient voices understanding of these instructions, accepts the plan, and  is comfortable with discharge.  Final Clinical Impressions(s) / ED Diagnoses   Final diagnoses:  Encounter for staple removal    ED Discharge Orders    None       Concepcion Living 06/18/18 0957    Raeford Razor, MD 06/19/18 1219

## 2018-10-24 ENCOUNTER — Emergency Department (HOSPITAL_COMMUNITY)
Admission: EM | Admit: 2018-10-24 | Discharge: 2018-10-24 | Discharge status: Against Medical Advice | Payer: Self-pay | Attending: Emergency Medicine | Admitting: Emergency Medicine

## 2018-10-24 ENCOUNTER — Emergency Department (HOSPITAL_COMMUNITY): Payer: Self-pay

## 2018-10-24 DIAGNOSIS — Y908 Blood alcohol level of 240 mg/100 ml or more: Secondary | ICD-10-CM | POA: Insufficient documentation

## 2018-10-24 DIAGNOSIS — F10920 Alcohol use, unspecified with intoxication, uncomplicated: Secondary | ICD-10-CM

## 2018-10-24 DIAGNOSIS — R7989 Other specified abnormal findings of blood chemistry: Secondary | ICD-10-CM

## 2018-10-24 DIAGNOSIS — F10929 Alcohol use, unspecified with intoxication, unspecified: Secondary | ICD-10-CM | POA: Insufficient documentation

## 2018-10-24 LAB — CBC WITH DIFFERENTIAL/PLATELET
Abs Immature Granulocytes: 0 10*3/uL (ref 0.00–0.07)
Basophils Absolute: 0.3 10*3/uL — ABNORMAL HIGH (ref 0.0–0.1)
Basophils Relative: 5 %
Eosinophils Absolute: 0 10*3/uL (ref 0.0–0.5)
Eosinophils Relative: 0 %
HCT: 37.2 % (ref 36.0–46.0)
Hemoglobin: 12.2 g/dL (ref 12.0–15.0)
Lymphocytes Relative: 45 %
Lymphs Abs: 3.1 10*3/uL (ref 0.7–4.0)
MCH: 30.6 pg (ref 26.0–34.0)
MCHC: 32.8 g/dL (ref 30.0–36.0)
MCV: 93.2 fL (ref 80.0–100.0)
Monocytes Absolute: 0.4 10*3/uL (ref 0.1–1.0)
Monocytes Relative: 6 %
Neutro Abs: 3 10*3/uL (ref 1.7–7.7)
Neutrophils Relative %: 44 %
Platelets: 220 10*3/uL (ref 150–400)
RBC: 3.99 MIL/uL (ref 3.87–5.11)
RDW: 23.5 % — ABNORMAL HIGH (ref 11.5–15.5)
WBC: 6.9 10*3/uL (ref 4.0–10.5)
nRBC: 0 % (ref 0.0–0.2)
nRBC: 1 /100 WBC — ABNORMAL HIGH

## 2018-10-24 LAB — I-STAT BETA HCG BLOOD, ED (MC, WL, AP ONLY): I-stat hCG, quantitative: <5 m[IU]/mL (ref <5)

## 2018-10-24 LAB — ETHANOL: Alcohol, Ethyl (B): 496 mg/dL (ref <10)

## 2018-10-24 LAB — COMPREHENSIVE METABOLIC PANEL
ALT: 48 U/L — ABNORMAL HIGH (ref 0–44)
AST: 210 U/L — ABNORMAL HIGH (ref 15–41)
Albumin: 3.8 g/dL (ref 3.5–5.0)
Alkaline Phosphatase: 225 U/L — ABNORMAL HIGH (ref 38–126)
Anion gap: 14 (ref 5–15)
BUN: 8 mg/dL (ref 6–20)
CO2: 21 mmol/L — ABNORMAL LOW (ref 22–32)
Calcium: 7.9 mg/dL — ABNORMAL LOW (ref 8.9–10.3)
Chloride: 105 mmol/L (ref 98–111)
Creatinine, Ser: 0.62 mg/dL (ref 0.44–1.00)
GFR calc Af Amer: >60 mL/min (ref >60)
GFR calc non Af Amer: >60 mL/min (ref >60)
Glucose, Bld: 83 mg/dL (ref 70–99)
Potassium: 4.8 mmol/L (ref 3.5–5.1)
Sodium: 140 mmol/L (ref 135–145)
Total Bilirubin: 0.9 mg/dL (ref 0.3–1.2)
Total Protein: 7.8 g/dL (ref 6.5–8.1)

## 2018-10-24 LAB — CBG MONITORING, ED: Glucose-Capillary: 72 mg/dL (ref 70–99)

## 2018-10-24 NOTE — ED Notes (Signed)
Pt refuses t

## 2018-10-24 NOTE — ED Notes (Signed)
Pt ELOPED

## 2018-10-24 NOTE — ED Triage Notes (Signed)
Pt arrives via EMS with ETOH intoxication. A&O x2.

## 2018-10-24 NOTE — ED Provider Notes (Signed)
Patient signed out to me by Mal Misty, PA-C.  Please see previous notes for further history.  In brief, patient presenting for evaluation of altered mental status.  She was found on the side of the road.  Initial history difficult due to language barrier and intoxication.  Labs and imaging pending.  If reassuring, and patient is more sober and ambulatory, plan for discharge.  Patient slightly tachycardic, will ask for recheck of vitals.  CT head and neck negative for acute findings.  Chest x-ray viewed interpreted by me, no fracture, dislocation, pneumonia, pneumothorax.  CBC reassuring.  Ethanol elevated at 496.  CMP unable to be analyzed, will redraw lab.  When I went to check on the patient, patient was not in the room.  As she did not return to the room, assumed that patient eloped, although this was not seen, nor did patient inform any staff members of this.  CMP returned after patient elopement.  Of note, liver enzymes are elevated, likely due to chronic alcohol use.  Otherwise normal.   Franchot Heidelberg, PA-C 10/24/18 2101    Lucrezia Starch, MD 10/26/18 [phone]

## 2018-10-24 NOTE — ED Notes (Signed)
Patient transported to X-ray 

## 2018-10-24 NOTE — ED Provider Notes (Signed)
[facility] EMERGENCY DEPARTMENT Provider Note   CSN: [id number] Arrival date & time: 10/24/18  1248     History   Chief Complaint Chief Complaint  Patient presents with   Alcohol Intoxication   Altered Mental Status    HPI Susan Mcconnell is a 43 y.o. female appears to be a middle-aged female who was found by EMS with alcohol intoxication.  Found on side of road?  It appears that she speaks [place].  She repeatedly saying "I do not know, I do not know."  She is unwilling to participate in exam.  No further information by EMS.  1:51 PM Further history provided by patient as she is sobering up. States she only drank 1 bottle of beer today. Tearful because she missed work because she was drinking. States she does not want to have anything done because she will be stuck with a big hospital bill. She denies any loss of consciousness.      HPI  No past medical history on file.  There are no active problems to display for this patient.      OB History   No obstetric history on file.      Home Medications    Prior to Admission medications   Not on File    Family History No family history on file.  Social History Social History   Tobacco Use   Smoking status: Not on file  Substance Use Topics   Alcohol use: Not on file   Drug use: Not on file     Allergies   Patient has no allergy information on record.   Review of Systems Review of Systems  Unable to perform ROS: Mental status change     Physical Exam Updated Vital Signs BP 117/87    Pulse 98    SpO2 100%   Physical Exam Vitals signs and nursing note reviewed.  Constitutional:      General: She is not in acute distress.    Appearance: She is well-developed.  HENT:     Head: Normocephalic and atraumatic.     Nose: Nose normal.  Eyes:     General: No scleral icterus.       Right eye: No discharge.        Left eye: No discharge.     Conjunctiva/sclera: Conjunctivae  normal.     Pupils: Pupils are equal, round, and reactive to light.  Neck:     Musculoskeletal: Normal range of motion and neck supple.  Cardiovascular:     Rate and Rhythm: Normal rate and regular rhythm.     Heart sounds: Normal heart sounds. No murmur. No friction rub. No gallop.   Pulmonary:     Effort: Pulmonary effort is normal. No respiratory distress.     Breath sounds: Normal breath sounds.  Abdominal:     General: Bowel sounds are normal. There is no distension.     Palpations: Abdomen is soft.     Tenderness: There is no abdominal tenderness. There is no guarding.  Musculoskeletal: Normal range of motion.  Skin:    General: Skin is warm and dry.     Findings: No rash.  Neurological:     Mental Status: She is alert.     Motor: No abnormal muscle tone.     Coordination: Coordination normal.     Comments: Alert, oriented to self. Slurring words. No facial asymmetry noted. Moves extremities without difficulty.      ED Treatments / Results  Labs (all labs ordered are listed, but only abnormal results are displayed) Labs Reviewed  CBC WITH DIFFERENTIAL/PLATELET - Abnormal; Notable for the following components:      Result Value   RDW 23.5 (*)    All other components within normal limits  ETHANOL - Abnormal; Notable for the following components:   Alcohol, Ethyl (B) 496 (*)    All other components within normal limits  URINALYSIS, COMPLETE (UACMP) WITH MICROSCOPIC  RAPID URINE DRUG SCREEN, HOSP PERFORMED  COMPREHENSIVE METABOLIC PANEL  CBG MONITORING, ED  I-STAT BETA HCG BLOOD, ED ([facility], [facility], [facility] ONLY)    EKG EKG Interpretation  Date/Time:  Thursday October 24 2018 13:55:01 EDT Ventricular Rate:  84 PR Interval:    QRS Duration: 82 QT Interval:  371 QTC Calculation: 439 R Axis:   66 Text Interpretation:  Sinus rhythm Low voltage, precordial leads Anteroseptal infarct, old No STEMI  Confirmed by Octaviano Glow [phone]) on 10/24/2018 2:14:11 PM   Radiology Dg  Chest 2 View  Result Date: 10/24/2018 CLINICAL DATA:  Altered mental status.  Intoxication. EXAM: CHEST - 2 VIEW COMPARISON:  Chest x-ray dated August 15, 2017. FINDINGS: The heart size and mediastinal contours are within normal limits. Both lungs are clear. The visualized skeletal structures are unremarkable. IMPRESSION: No active cardiopulmonary disease. Electronically Signed   By: Titus Dubin M.D.   On: 10/24/2018 14:48    Procedures Procedures (including critical care time)  Medications Ordered in ED Medications - No data to display   Initial Impression / Assessment and Plan / ED Course  I have reviewed the triage vital signs and the nursing notes.  Pertinent labs & imaging results that were available during my care of the patient were reviewed by me and considered in my medical decision making (see chart for details).  Clinical Course as of Oct 23 1556  Thu Oct 24, 2018  1553 Alcohol, Ethyl (B)(!!): 496 [HK]    Clinical Course User Index [HK] Delia Heady, PA-C       43 year old female with past medical history of alcohol abuse presents to ED with alcohol intoxication.  Patient originally came in as a Susan Mcconnell, found on the side of the road by EMS but unsure exactly where.  Patient is slowly returning to sobriety, mainly telling me she is sad because she had to miss work today due to being drunk. Unwilling to tell me how much alcohol she drank or if she drinks daily. No deficits to neurological exam noted. Will obtain labs, imaging and allow her to metabolize. Care handed off to oncoming provider if work-up is reassuring, she can be discharged home when she is sober.  Final Clinical Impressions(s) / ED Diagnoses   Final diagnoses:  Alcoholic intoxication without complication [facility])    ED Discharge Orders    None       Delia Heady, PA-C 10/24/18 1558    Wyvonnia Dusky, MD 10/24/18 1712

## 2018-11-26 ENCOUNTER — Emergency Department (HOSPITAL_COMMUNITY): Payer: Self-pay

## 2018-11-26 ENCOUNTER — Emergency Department (HOSPITAL_COMMUNITY)
Admission: EM | Admit: 2018-11-26 | Discharge: 2018-11-26 | Disposition: A | Discharge status: Home / Self Care | Payer: Self-pay | Attending: Emergency Medicine | Admitting: Emergency Medicine

## 2018-11-26 ENCOUNTER — Encounter (HOSPITAL_COMMUNITY): Payer: Self-pay | Admitting: Emergency Medicine

## 2018-11-26 ENCOUNTER — Other Ambulatory Visit: Payer: Self-pay

## 2018-11-26 DIAGNOSIS — F17220 Nicotine dependence, chewing tobacco, uncomplicated: Secondary | ICD-10-CM | POA: Insufficient documentation

## 2018-11-26 DIAGNOSIS — W19XXXA Unspecified fall, initial encounter: Secondary | ICD-10-CM | POA: Insufficient documentation

## 2018-11-26 DIAGNOSIS — Z79899 Other long term (current) drug therapy: Secondary | ICD-10-CM | POA: Insufficient documentation

## 2018-11-26 DIAGNOSIS — M25562 Pain in left knee: Secondary | ICD-10-CM | POA: Insufficient documentation

## 2018-11-26 DIAGNOSIS — F1010 Alcohol abuse, uncomplicated: Secondary | ICD-10-CM | POA: Insufficient documentation

## 2018-11-26 MED ORDER — NAPROXEN 500 MG PO TABS
500.0000 mg | ORAL_TABLET | Freq: Once | ORAL | Status: AC
  Administered 2018-11-26: 500 mg via ORAL
  Filled 2018-11-26: qty 1

## 2018-11-26 MED ORDER — NAPROXEN 500 MG PO TABS: 500.0000 mg | ORAL_TABLET | Freq: Two times a day (BID) | ORAL | 0 refills | Status: AC

## 2018-11-26 NOTE — ED Notes (Signed)
Patient refused crutches.

## 2018-11-26 NOTE — Discharge Instructions (Addendum)
Thank you for allowing me to care for you today. Your xray showed swelling your knee but no fracture or dislocation. If your knee pain does not improve with rest, ice and the medication prescribed then please follow up with orthopedics. Please return to the emergency department if you have new or worsening symptoms. Take your medications as instructed.

## 2018-11-26 NOTE — ED Triage Notes (Signed)
Per [facility] pt from [facility] where was caught for stealing. Pt has left knee swelling where injured it 2 days ago. Has ETOH on board and altered per EMS. Pt speaks to [place].

## 2018-11-26 NOTE — ED Provider Notes (Addendum)
[facility]-EMERGENCY DEPT Provider Note   CSN: [id number] Arrival date & time: 11/26/18  1754     History   Chief Complaint No chief complaint on file.   HPI Susan Mcconnell is a 43 y.o. female.     Patient is a 43 year old female with past medical history of alcohol abuse, alcoholic hepatitis, who speaks [name] presenting to the emergency department for left knee pain.  Patient reports that she fell while she was intoxicated onto her knee a couple weeks ago.  Reports pain and swelling.  Has not tried anything for relief.  Reports significant amount of alcohol use every day.  Denies any other symptoms to me.     Past Medical History:  Diagnosis Date  . Alcoholic hepatitis   . Pneumonia   . TB (tuberculosis) ~ 2017   "took the RX and was healed" (05/04/2017)    Patient Active Problem List   Diagnosis Date Noted  . Alcohol abuse 04/03/2018  . MDD (major depressive disorder), severe (HCC) 03/24/2018  . Alcohol use disorder, severe, dependence (HCC)   . Hypernatremia 08/15/2017  . Suicidal ideation 08/15/2017  . Alcohol intoxication (HCC) 08/15/2017  . Sepsis due to pneumonia (HCC) 06/24/2017  . Alcoholic intoxication without complication (HCC)   . HCAP (healthcare-associated pneumonia)   . Hypotension due to hypovolemia   . Hepatitis   . Hypertriglyceridemia 05/06/2017  . Compensated metabolic acidosis 05/06/2017  . Pancytopenia (HCC)   . Thrombocytopenia (HCC) 05/04/2017  . Hypokalemia 05/04/2017  . Abnormal LFTs 05/04/2017  . Chest pain 05/04/2017  . Protein-calorie malnutrition, moderate (HCC) 05/04/2017  . Elevated liver enzymes     Past Surgical History:  Procedure Laterality Date  . OTHER SURGICAL HISTORY  2010   "birth control surgery; don't know what they did"     OB History   No obstetric history on file.      Home Medications    Prior to Admission medications   Medication Sig Start Date End Date Taking? Authorizing Provider   FLUoxetine (PROZAC) 20 MG capsule Take 1 capsule (20 mg total) by mouth daily. For mood 04/02/18   Malvin JohnsFarah, Brian, MD  naproxen (NAPROSYN) 500 MG tablet Take 1 tablet (500 mg total) by mouth 2 (two) times daily. 11/26/18   Arlyn DunningMcLean, Parneet Glantz A, PA-C  nicotine (NICODERM CQ - DOSED IN MG/24 HOURS) 21 mg/24hr patch Place 1 patch (21 mg total) onto the skin daily. For smoking cessation 03/29/18   Aldean BakerSykes, Janet E, NP  Prenatal Vit-Fe Fumarate-FA (PRENATAL MULTIVITAMIN) TABS tablet Take 1 tablet by mouth daily. 04/02/18   Malvin JohnsFarah, Brian, MD  thiamine 100 MG tablet Take 1 tablet (100 mg total) by mouth daily. Patient not taking: Reported on 03/23/2018 08/18/17   Elgergawy, Leana Roeawood S, MD  traZODone (DESYREL) 100 MG tablet Take 1 tablet (100 mg total) by mouth at bedtime as needed for sleep. 04/02/18   Malvin JohnsFarah, Brian, MD    Family History Family History  Family history unknown: Yes    Social History Social History   Tobacco Use  . Smoking status: Never Smoker  . Smokeless tobacco: Current User    Types: Chew  Substance Use Topics  . Alcohol use: Yes    Alcohol/week: 21.0 standard drinks    Types: 21 Cans of beer per week    Comment: 05/04/2017 "1 can, 3 times/day"  . Drug use: Never     Allergies   Patient has no known allergies.   Review of Systems Review of Systems  Constitutional: Negative for appetite change, chills and fever.  HENT: Negative for congestion.   Respiratory: Negative for cough and shortness of breath.   Genitourinary: Negative for dysuria.  Musculoskeletal: Positive for arthralgias and joint swelling. Negative for back pain and gait problem.  Skin: Negative for rash.  Neurological: Negative for dizziness.  Hematological: Does not bruise/bleed easily.     Physical Exam Updated Vital Signs BP 102/81   Pulse 97   Temp 98.2 F (36.8 C) (Axillary)   Resp 18   SpO2 98%   Physical Exam Vitals signs and nursing note reviewed.  Constitutional:      Appearance: Normal appearance.   HENT:     Head: Normocephalic.  Eyes:     Conjunctiva/sclera: Conjunctivae normal.  Pulmonary:     Effort: Pulmonary effort is normal.  Musculoskeletal:     Left knee: She exhibits swelling. She exhibits normal range of motion, no effusion, no ecchymosis, no deformity, no laceration, no erythema, normal alignment and no bony tenderness. Tenderness found. Medial joint line and lateral joint line tenderness noted.     Comments: Mild and diffuse swelling of the left knee with TTP. No erythema or increased warmth. No skin changes. Normal ROM but pain with extension  Skin:    General: Skin is dry.  Neurological:     Mental Status: She is alert.  Psychiatric:        Mood and Affect: Mood normal.      ED Treatments / Results  Labs (all labs ordered are listed, but only abnormal results are displayed) Labs Reviewed - No data to display  EKG None  Radiology Dg Knee Complete 4 Views Left  Result Date: 11/26/2018 CLINICAL DATA:  Knee pain, injury 2 days prior EXAM: LEFT KNEE - COMPLETE 4+ VIEW COMPARISON:  Left tibia/fibular radiographs 02/03/2018 FINDINGS: Mild anterior soft tissue swelling with a large left knee joint effusion. No acute fracture or traumatic malalignment is seen. No suspicious osseous lesions. IMPRESSION: Large left knee joint effusion with mild anterior soft tissue swelling. No acute osseous abnormality. Electronically Signed   By: Kreg Shropshire M.D.   On: 11/26/2018 20:44    Procedures Procedures (including critical care time)  Medications Ordered in ED Medications  naproxen (NAPROSYN) tablet 500 mg (500 mg Oral Given 11/26/18 2023)     Initial Impression / Assessment and Plan / ED Course  I have reviewed the triage vital signs and the nursing notes.  Pertinent labs & imaging results that were available during my care of the patient were reviewed by me and considered in my medical decision making (see chart for details).  Clinical Course as of Nov 26 2130   Tue Nov 26, 2018  2104 Traumatic L knee pain and swelling after fall. No signs of trauma or skin changes. Xray shows effusion but no bony abnormality. Given naproxen. Advised RICE treatment, f/u pmd or ortho.  Patient declined crutches and will ambulate without them. She asked to sleep in the hospital because she did not have a ride home.  She lives with her son but reports her son does not drive.  Will consult case manager this time.   [KM]    Clinical Course User Index [KM] Arlyn Dunning, PA-C       Based on review of vitals, medical screening exam, lab work and/or imaging, there does not appear to be an acute, emergent etiology for the patient's symptoms. Counseled pt on good return precautions and encouraged both PCP and ED  follow-up as needed.  Prior to discharge, I also discussed incidental imaging findings with patient in detail and advised appropriate, recommended follow-up in detail.  Clinical Impression: 1. Fall, initial encounter   2. Alcohol abuse   3. Acute pain of left knee     Disposition: Discharge  Prior to providing a prescription for a controlled substance, I independently reviewed the patient's recent prescription history on the [place]. The patient had no recent or regular prescriptions and was deemed appropriate for a brief, less than 3 day prescription of narcotic for acute analgesia.  This note was prepared with assistance of Systems analyst. Occasional wrong-word or sound-a-like substitutions may have occurred due to the inherent limitations of voice recognition software.   Final Clinical Impressions(s) / ED Diagnoses   Final diagnoses:  Fall, initial encounter  Alcohol abuse  Acute pain of left knee    ED Discharge Orders         Ordered    naproxen (NAPROSYN) 500 MG tablet  2 times daily     11/26/18 2112           Kristine Royal 11/26/18 2114    Alveria Apley, PA-C  11/26/18 2337    Lacretia Leigh, MD 11/28/18 1341

## 2018-11-26 NOTE — Progress Notes (Signed)
TOC CM chart review 4 ED visits in six months/no PCP, no insurance.  Pt has a Conservator, museum/gallery, will follow up with her tomorrow, 11/27/2018. [place], [place] ED TOC CM [phone]

## 2018-11-26 NOTE — ED Triage Notes (Signed)
Used Engineer, structural [phone]. Pt fell 2 days ago and injured her left knee when she was drunk. Pt reports drinking half bottle wine earlier today. Pt wants to know what is going on with her hands and why the skin is peeling.

## 2018-11-27 NOTE — Progress Notes (Signed)
TOC CM reached out to pt's CW, Mertie Moores. Pt lives in apt with her sonJamira, [place] # [phone]. He speaks some [place]. States the [employer] assist her with housing issues, food, food stamps and other social issues. States pt has been to drug rehab in the past for a month but after she returned she started drinking again. States she currently is not working due to ETOH abuse. Her sister, Lilymarie Scroggins lives in apt next door. Pt is a refugee. [place], [place] ED TOC CM [phone]

## 2019-02-17 ENCOUNTER — Other Ambulatory Visit: Payer: Self-pay

## 2019-02-17 ENCOUNTER — Emergency Department (HOSPITAL_COMMUNITY)
Admission: EM | Admit: 2019-02-17 | Discharge: 2019-02-18 | Disposition: A | Discharge status: Home / Self Care | Payer: Medicaid Other | Attending: Emergency Medicine | Admitting: Emergency Medicine

## 2019-02-17 DIAGNOSIS — Y908 Blood alcohol level of 240 mg/100 ml or more: Secondary | ICD-10-CM | POA: Insufficient documentation

## 2019-02-17 DIAGNOSIS — F17220 Nicotine dependence, chewing tobacco, uncomplicated: Secondary | ICD-10-CM | POA: Insufficient documentation

## 2019-02-17 DIAGNOSIS — Z79899 Other long term (current) drug therapy: Secondary | ICD-10-CM | POA: Insufficient documentation

## 2019-02-17 DIAGNOSIS — F10920 Alcohol use, unspecified with intoxication, uncomplicated: Secondary | ICD-10-CM | POA: Insufficient documentation

## 2019-02-17 LAB — CBC WITH DIFFERENTIAL/PLATELET
Abs Immature Granulocytes: 0.01 10*3/uL (ref 0.00–0.07)
Basophils Absolute: 0.1 10*3/uL (ref 0.0–0.1)
Basophils Relative: 1 %
Eosinophils Absolute: 0 10*3/uL (ref 0.0–0.5)
Eosinophils Relative: 1 %
HCT: 36.3 % (ref 36.0–46.0)
Hemoglobin: 12.3 g/dL (ref 12.0–15.0)
Immature Granulocytes: 0 %
Lymphocytes Relative: 46 %
Lymphs Abs: 2 10*3/uL (ref 0.7–4.0)
MCH: 32.2 pg (ref 26.0–34.0)
MCHC: 33.9 g/dL (ref 30.0–36.0)
MCV: 95 fL (ref 80.0–100.0)
Monocytes Absolute: 0.5 10*3/uL (ref 0.1–1.0)
Monocytes Relative: 12 %
Neutro Abs: 1.8 10*3/uL (ref 1.7–7.7)
Neutrophils Relative %: 40 %
Platelets: 82 10*3/uL — ABNORMAL LOW (ref 150–400)
RBC: 3.82 MIL/uL — ABNORMAL LOW (ref 3.87–5.11)
RDW: 18.6 % — ABNORMAL HIGH (ref 11.5–15.5)
WBC: 4.4 10*3/uL (ref 4.0–10.5)
nRBC: 0 % (ref 0.0–0.2)

## 2019-02-17 LAB — BASIC METABOLIC PANEL
Anion gap: 16 — ABNORMAL HIGH (ref 5–15)
BUN: <5 mg/dL — ABNORMAL LOW (ref 6–20)
CO2: 21 mmol/L — ABNORMAL LOW (ref 22–32)
Calcium: 8.5 mg/dL — ABNORMAL LOW (ref 8.9–10.3)
Chloride: 105 mmol/L (ref 98–111)
Creatinine, Ser: 0.57 mg/dL (ref 0.44–1.00)
GFR calc Af Amer: >60 mL/min (ref >60)
GFR calc non Af Amer: >60 mL/min (ref >60)
Glucose, Bld: 114 mg/dL — ABNORMAL HIGH (ref 70–99)
Potassium: 3.4 mmol/L — ABNORMAL LOW (ref 3.5–5.1)
Sodium: 142 mmol/L (ref 135–145)

## 2019-02-17 LAB — ETHANOL: Alcohol, Ethyl (B): 425 mg/dL (ref <10)

## 2019-02-17 MED ORDER — THIAMINE HCL 100 MG PO TABS
100.0000 mg | ORAL_TABLET | Freq: Once | ORAL | Status: AC
  Administered 2019-02-17: 20:00:00 100 mg via ORAL
  Filled 2019-02-17: qty 1

## 2019-02-17 MED ORDER — ADULT MULTIVITAMIN W/MINERALS CH
1.0000 | ORAL_TABLET | Freq: Once | ORAL | Status: AC
  Administered 2019-02-17: 1 via ORAL
  Filled 2019-02-17: qty 1

## 2019-02-17 MED ORDER — FOLIC ACID 1 MG PO TABS
1.0000 mg | ORAL_TABLET | Freq: Once | ORAL | Status: AC
  Administered 2019-02-17: 20:00:00 1 mg via ORAL
  Filled 2019-02-17: qty 1

## 2019-02-17 MED ORDER — LACTATED RINGERS IV BOLUS
1000.0000 mL | Freq: Once | INTRAVENOUS | Status: AC
  Administered 2019-02-17: 20:00:00 1000 mL via INTRAVENOUS

## 2019-02-17 NOTE — ED Provider Notes (Signed)
[facility] EMERGENCY DEPARTMENT Provider Note   CSN: [id number] Arrival date & time: 02/17/19  1821     History Chief Complaint  Patient presents with  . Alcohol Intoxication    Susan Mcconnell is a 44 y.o. female.  HPI    44 year old female with alcohol intoxication.  There is a language barrier in her intoxication for the limits history.  Police interpreter utilized.  She was found behind a local grocery store intoxicated.  She states that she drinks because her son to let take care of her.  She states that she was having thoughts of wanting to harm herself before the police arrived.  She has no specific plan.  Past Medical History:  Diagnosis Date  . Alcoholic hepatitis   . Pneumonia   . TB (tuberculosis) ~ 2017   "took the RX and was healed" (05/04/2017)    Patient Active Problem List   Diagnosis Date Noted  . Alcohol abuse 04/03/2018  . MDD (major depressive disorder), severe ([place]) 03/24/2018  . Alcohol use disorder, severe, dependence ([place])   . Hypernatremia 08/15/2017  . Suicidal ideation 08/15/2017  . Alcohol intoxication ([place]) 08/15/2017  . Sepsis due to pneumonia ([place]) 06/24/2017  . Alcoholic intoxication without complication ([place])   . HCAP (healthcare-associated pneumonia)   . Hypotension due to hypovolemia   . Hepatitis   . Hypertriglyceridemia 05/06/2017  . Compensated metabolic acidosis 99/24/2683  . Pancytopenia ([place])   . Thrombocytopenia ([place]) 05/04/2017  . Hypokalemia 05/04/2017  . Abnormal LFTs 05/04/2017  . Chest pain 05/04/2017  . Protein-calorie malnutrition, moderate ([place]) 05/04/2017  . Elevated liver enzymes     Past Surgical History:  Procedure Laterality Date  . OTHER SURGICAL HISTORY  2010   "birth control surgery; don't know what they did"     OB History   No obstetric history on file.     Family History  Family history unknown: Yes    Social History   Tobacco Use  . Smoking status: Never Smoker  .  Smokeless tobacco: Current User    Types: Chew  Substance Use Topics  . Alcohol use: Yes    Alcohol/week: 21.0 standard drinks    Types: 21 Cans of beer per week    Comment: 05/04/2017 "1 can, 3 times/day"  . Drug use: Never    Home Medications Prior to Admission medications   Medication Sig Start Date End Date Taking? Authorizing Provider  FLUoxetine (PROZAC) 20 MG capsule Take 1 capsule (20 mg total) by mouth daily. For mood 04/02/18   Johnn Hai, MD  naproxen (NAPROSYN) 500 MG tablet Take 1 tablet (500 mg total) by mouth 2 (two) times daily. 11/26/18   Alveria Apley, PA-C  nicotine (NICODERM CQ - DOSED IN MG/24 HOURS) 21 mg/24hr patch Place 1 patch (21 mg total) onto the skin daily. For smoking cessation 03/29/18   Connye Burkitt, NP  Prenatal Vit-Fe Fumarate-FA (PRENATAL MULTIVITAMIN) TABS tablet Take 1 tablet by mouth daily. 04/02/18   Johnn Hai, MD  thiamine 100 MG tablet Take 1 tablet (100 mg total) by mouth daily. Patient not taking: Reported on 03/23/2018 08/18/17   Elgergawy, Silver Huguenin, MD  traZODone (DESYREL) 100 MG tablet Take 1 tablet (100 mg total) by mouth at bedtime as needed for sleep. 04/02/18   Johnn Hai, MD    Allergies    Patient has no known allergies.  Review of Systems   Review of Systems Level 5 caveat because of alcohol intoxication.  Physical Exam Updated Vital Signs BP 112/84   Pulse 97   Temp 98.6 F (37 C) (Oral)   Resp 14   SpO2 96%   Physical Exam Vitals and nursing note reviewed.  Constitutional:      General: She is not in acute distress.    Appearance: She is well-developed.  HENT:     Head: Normocephalic and atraumatic.  Eyes:     General:        Right eye: No discharge.        Left eye: No discharge.     Conjunctiva/sclera: Conjunctivae normal.  Cardiovascular:     Rate and Rhythm: Regular rhythm. Tachycardia present.     Heart sounds: Normal heart sounds. No murmur. No friction rub. No gallop.   Pulmonary:     Effort: Pulmonary  effort is normal. No respiratory distress.     Breath sounds: Normal breath sounds.  Abdominal:     General: There is no distension.     Palpations: Abdomen is soft.     Tenderness: There is no abdominal tenderness.  Musculoskeletal:        General: No tenderness.     Cervical back: Neck supple.  Skin:    General: Skin is warm and dry.  Neurological:     Mental Status: She is alert.  Psychiatric:     Comments: intoxicated     ED Results / Procedures / Treatments   Labs (all labs ordered are listed, but only abnormal results are displayed) Labs Reviewed  CBC WITH DIFFERENTIAL/PLATELET - Abnormal; Notable for the following components:      Result Value   RBC 3.82 (*)    RDW 18.6 (*)    Platelets 82 (*)    All other components within normal limits  BASIC METABOLIC PANEL - Abnormal; Notable for the following components:   Potassium 3.4 (*)    CO2 21 (*)    Glucose, Bld 114 (*)    BUN <5 (*)    Calcium 8.5 (*)    Anion gap 16 (*)    All other components within normal limits  ETHANOL - Abnormal; Notable for the following components:   Alcohol, Ethyl (B) 425 (*)    All other components within normal limits    EKG None  Radiology No results found.  Procedures Procedures (including critical care time)  Medications Ordered in ED Medications  lactated ringers bolus 1,000 mL (has no administration in time range)  thiamine tablet 100 mg (has no administration in time range)  folic acid (FOLVITE) tablet 1 mg (has no administration in time range)  multivitamin with minerals tablet 1 tablet (has no administration in time range)    ED Course  I have reviewed the triage vital signs and the nursing notes.  Pertinent labs & imaging results that were available during my care of the patient were reviewed by me and considered in my medical decision making (see chart for details).  Clinical Course as of Feb 18 1543  Tue Feb 18, 2019  0354 Pt signed out to me by Dr. Adela Lank.   Briefly 44 yo female w/ hx of chronic etoh use presenting to Ed after alcohol binge yesterday.  Initial etoh 425, patient monitored for approx 13 hours now in the ED, showing some improvement, plan to continue monitoring for improving sobriety.   [MT]  1120 Patient awake, speaking clearly, stable on feet, anticipate discharge   [MT]    Clinical Course User Index [MT] Alvester Chou  J, MD   MDM Rules/Calculators/A&P                       Heavy etoh intoxication. Observe until clinically sober enough for discharge.   Final Clinical Impression(s) / ED Diagnoses Final diagnoses:  Alcoholic intoxication without complication [facility])    Rx / DC Orders ED Discharge Orders    None       Raeford Razor, MD 02/18/19 1546

## 2019-02-17 NOTE — ED Triage Notes (Signed)
Pt BIB [facility] from [facility] [employer]. Pt found to be underneath some stairs there. EMS reported they could not find any obvious injuries but were unable to get a full assessment due to a language barrier. EMS reports stable vital signs. Pt arrives stating that she is "not sick."

## 2019-02-17 NOTE — ED Notes (Signed)
Pt reporting that she had 1 beer today. Reports she drinks alcohol everyday "because my sons don't take care of me." Pt reporting that she wanted to end her life today but "the police stopped me."

## 2019-02-18 NOTE — ED Notes (Signed)
Susan Mcconnell son [phone] wanting an update on the patient

## 2019-02-18 NOTE — ED Notes (Signed)
Measurements based on previous findings

## 2019-02-18 NOTE — ED Notes (Signed)
Pt ambulated to and from bathroom without assistance 

## 2019-02-18 NOTE — ED Notes (Signed)
Patient ambulated to the restroom with one-person assist. Patient's gait was not steady without help. The patient is now resting comfortably.

## 2019-04-27 ENCOUNTER — Emergency Department (HOSPITAL_COMMUNITY): Payer: Medicaid Other

## 2019-04-27 ENCOUNTER — Encounter (HOSPITAL_COMMUNITY): Payer: Self-pay

## 2019-04-27 ENCOUNTER — Inpatient Hospital Stay (HOSPITAL_COMMUNITY): Payer: Medicaid Other | Admitting: Anesthesiology

## 2019-04-27 ENCOUNTER — Other Ambulatory Visit: Payer: Self-pay

## 2019-04-27 ENCOUNTER — Inpatient Hospital Stay (HOSPITAL_COMMUNITY)
Admission: EM | Admit: 2019-04-27 | Discharge: 2019-05-31 | DRG: 025 | Disposition: E | Payer: Medicaid Other | Attending: Neurosurgery | Admitting: Neurosurgery

## 2019-04-27 ENCOUNTER — Encounter (HOSPITAL_COMMUNITY): Admission: EM | Disposition: E | Payer: Self-pay | Source: Home / Self Care

## 2019-04-27 DIAGNOSIS — D696 Thrombocytopenia, unspecified: Secondary | ICD-10-CM | POA: Diagnosis present

## 2019-04-27 DIAGNOSIS — Z66 Do not resuscitate: Secondary | ICD-10-CM | POA: Diagnosis not present

## 2019-04-27 DIAGNOSIS — S0101XA Laceration without foreign body of scalp, initial encounter: Secondary | ICD-10-CM | POA: Diagnosis present

## 2019-04-27 DIAGNOSIS — G919 Hydrocephalus, unspecified: Secondary | ICD-10-CM | POA: Diagnosis present

## 2019-04-27 DIAGNOSIS — E870 Hyperosmolality and hypernatremia: Secondary | ICD-10-CM | POA: Diagnosis not present

## 2019-04-27 DIAGNOSIS — R402334 Coma scale, best motor response, abnormal, 24 hours or more after hospital admission: Secondary | ICD-10-CM | POA: Diagnosis not present

## 2019-04-27 DIAGNOSIS — G935 Compression of brain: Secondary | ICD-10-CM | POA: Diagnosis not present

## 2019-04-27 DIAGNOSIS — Z20822 Contact with and (suspected) exposure to covid-19: Secondary | ICD-10-CM | POA: Diagnosis present

## 2019-04-27 DIAGNOSIS — R402114 Coma scale, eyes open, never, 24 hours or more after hospital admission: Secondary | ICD-10-CM | POA: Diagnosis not present

## 2019-04-27 DIAGNOSIS — S064X6A Epidural hemorrhage with loss of consciousness greater than 24 hours without return to pre-existing conscious level with patient surviving, initial encounter: Principal | ICD-10-CM | POA: Diagnosis present

## 2019-04-27 DIAGNOSIS — R402112 Coma scale, eyes open, never, at arrival to emergency department: Secondary | ICD-10-CM | POA: Diagnosis present

## 2019-04-27 DIAGNOSIS — S0219XA Other fracture of base of skull, initial encounter for closed fracture: Secondary | ICD-10-CM | POA: Diagnosis present

## 2019-04-27 DIAGNOSIS — J9690 Respiratory failure, unspecified, unspecified whether with hypoxia or hypercapnia: Secondary | ICD-10-CM | POA: Diagnosis present

## 2019-04-27 DIAGNOSIS — Z515 Encounter for palliative care: Secondary | ICD-10-CM

## 2019-04-27 DIAGNOSIS — J9610 Chronic respiratory failure, unspecified whether with hypoxia or hypercapnia: Secondary | ICD-10-CM

## 2019-04-27 DIAGNOSIS — R402214 Coma scale, best verbal response, none, 24 hours or more after hospital admission: Secondary | ICD-10-CM | POA: Diagnosis not present

## 2019-04-27 DIAGNOSIS — Y838 Other surgical procedures as the cause of abnormal reaction of the patient, or of later complication, without mention of misadventure at the time of the procedure: Secondary | ICD-10-CM | POA: Diagnosis not present

## 2019-04-27 DIAGNOSIS — Z9911 Dependence on respirator [ventilator] status: Secondary | ICD-10-CM

## 2019-04-27 DIAGNOSIS — S061X6A Traumatic cerebral edema with loss of consciousness greater than 24 hours without return to pre-existing conscious level with patient surviving, initial encounter: Secondary | ICD-10-CM | POA: Diagnosis present

## 2019-04-27 DIAGNOSIS — S066X6A Traumatic subarachnoid hemorrhage with loss of consciousness greater than 24 hours without return to pre-existing conscious level with patient surviving, initial encounter: Secondary | ICD-10-CM | POA: Diagnosis present

## 2019-04-27 DIAGNOSIS — R451 Restlessness and agitation: Secondary | ICD-10-CM | POA: Diagnosis not present

## 2019-04-27 DIAGNOSIS — G8191 Hemiplegia, unspecified affecting right dominant side: Secondary | ICD-10-CM | POA: Diagnosis present

## 2019-04-27 DIAGNOSIS — R402322 Coma scale, best motor response, extension, at arrival to emergency department: Secondary | ICD-10-CM | POA: Diagnosis present

## 2019-04-27 DIAGNOSIS — S0193XA Puncture wound without foreign body of unspecified part of head, initial encounter: Secondary | ICD-10-CM

## 2019-04-27 DIAGNOSIS — R0902 Hypoxemia: Secondary | ICD-10-CM

## 2019-04-27 DIAGNOSIS — J9620 Acute and chronic respiratory failure, unspecified whether with hypoxia or hypercapnia: Secondary | ICD-10-CM | POA: Diagnosis present

## 2019-04-27 DIAGNOSIS — Y908 Blood alcohol level of 240 mg/100 ml or more: Secondary | ICD-10-CM | POA: Diagnosis present

## 2019-04-27 DIAGNOSIS — K7689 Other specified diseases of liver: Secondary | ICD-10-CM | POA: Diagnosis present

## 2019-04-27 DIAGNOSIS — F10120 Alcohol abuse with intoxication, uncomplicated: Secondary | ICD-10-CM | POA: Diagnosis present

## 2019-04-27 DIAGNOSIS — T85890A Other specified complication of nervous system prosthetic devices, implants and grafts, initial encounter: Secondary | ICD-10-CM | POA: Diagnosis not present

## 2019-04-27 DIAGNOSIS — R402212 Coma scale, best verbal response, none, at arrival to emergency department: Secondary | ICD-10-CM | POA: Diagnosis present

## 2019-04-27 DIAGNOSIS — Z7189 Other specified counseling: Secondary | ICD-10-CM

## 2019-04-27 DIAGNOSIS — E876 Hypokalemia: Secondary | ICD-10-CM | POA: Diagnosis present

## 2019-04-27 DIAGNOSIS — S064XAA Epidural hemorrhage with loss of consciousness status unknown, initial encounter: Secondary | ICD-10-CM | POA: Diagnosis present

## 2019-04-27 DIAGNOSIS — T1490XA Injury, unspecified, initial encounter: Secondary | ICD-10-CM

## 2019-04-27 DIAGNOSIS — S064X9A Epidural hemorrhage with loss of consciousness of unspecified duration, initial encounter: Secondary | ICD-10-CM | POA: Diagnosis present

## 2019-04-27 HISTORY — PX: CRANIOTOMY: SHX93

## 2019-04-27 LAB — POCT I-STAT 7, (LYTES, BLD GAS, ICA,H+H)
Acid-base deficit: 11 mmol/L — ABNORMAL HIGH (ref 0.0–2.0)
Acid-base deficit: 10 mmol/L — ABNORMAL HIGH (ref 0.0–2.0)
Bicarbonate: 16.9 mmol/L — ABNORMAL LOW (ref 20.0–28.0)
Bicarbonate: 15.1 mmol/L — ABNORMAL LOW (ref 20.0–28.0)
Calcium, Ion: 1.05 mmol/L — ABNORMAL LOW (ref 1.15–1.40)
Calcium, Ion: 0.94 mmol/L — ABNORMAL LOW (ref 1.15–1.40)
HCT: 44 % (ref 36.0–46.0)
HCT: 21 % — ABNORMAL LOW (ref 36.0–46.0)
Hemoglobin: 15 g/dL (ref 12.0–15.0)
Hemoglobin: 7.1 g/dL — ABNORMAL LOW (ref 12.0–15.0)
O2 Saturation: 100 %
O2 Saturation: 100 %
Patient temperature: 96.7
Patient temperature: 36.1
Potassium: 3.7 mmol/L (ref 3.5–5.1)
Potassium: 3.4 mmol/L — ABNORMAL LOW (ref 3.5–5.1)
Sodium: 139 mmol/L (ref 135–145)
Sodium: 141 mmol/L (ref 135–145)
TCO2: 18 mmol/L — ABNORMAL LOW (ref 22–32)
TCO2: 16 mmol/L — ABNORMAL LOW (ref 22–32)
pCO2 arterial: 43 mmHg (ref 32.0–48.0)
pCO2 arterial: 27.3 mmHg — ABNORMAL LOW (ref 32.0–48.0)
pH, Arterial: 7.198 — CL (ref 7.350–7.450)
pH, Arterial: 7.346 — ABNORMAL LOW (ref 7.350–7.450)
pO2, Arterial: 524 mmHg — ABNORMAL HIGH (ref 83.0–108.0)
pO2, Arterial: 304 mmHg — ABNORMAL HIGH (ref 83.0–108.0)

## 2019-04-27 LAB — POCT I-STAT, CHEM 8
BUN: <3 mg/dL — ABNORMAL LOW (ref 6–20)
Calcium, Ion: 0.89 mmol/L — CL (ref 1.15–1.40)
Chloride: 112 mmol/L — ABNORMAL HIGH (ref 98–111)
Creatinine, Ser: 0.8 mg/dL (ref 0.44–1.00)
Glucose, Bld: 135 mg/dL — ABNORMAL HIGH (ref 70–99)
HCT: 25 % — ABNORMAL LOW (ref 36.0–46.0)
Hemoglobin: 8.5 g/dL — ABNORMAL LOW (ref 12.0–15.0)
Potassium: 3.8 mmol/L (ref 3.5–5.1)
Sodium: 143 mmol/L (ref 135–145)
TCO2: 16 mmol/L — ABNORMAL LOW (ref 22–32)

## 2019-04-27 LAB — CBC
HCT: 31.8 % — ABNORMAL LOW (ref 36.0–46.0)
HCT: 31.7 % — ABNORMAL LOW (ref 36.0–46.0)
Hemoglobin: 9.9 g/dL — ABNORMAL LOW (ref 12.0–15.0)
Hemoglobin: 10.9 g/dL — ABNORMAL LOW (ref 12.0–15.0)
MCH: 33.7 pg (ref 26.0–34.0)
MCH: 31 pg (ref 26.0–34.0)
MCHC: 31.1 g/dL (ref 30.0–36.0)
MCHC: 34.4 g/dL (ref 30.0–36.0)
MCV: 108.2 fL — ABNORMAL HIGH (ref 80.0–100.0)
MCV: 90.1 fL (ref 80.0–100.0)
Platelets: 155 10*3/uL (ref 150–400)
Platelets: 79 10*3/uL — ABNORMAL LOW (ref 150–400)
RBC: 2.94 MIL/uL — ABNORMAL LOW (ref 3.87–5.11)
RBC: 3.52 MIL/uL — ABNORMAL LOW (ref 3.87–5.11)
RDW: 18 % — ABNORMAL HIGH (ref 11.5–15.5)
RDW: 19.8 % — ABNORMAL HIGH (ref 11.5–15.5)
WBC: 17.9 10*3/uL — ABNORMAL HIGH (ref 4.0–10.5)
WBC: 7.6 10*3/uL (ref 4.0–10.5)
nRBC: 0 % (ref 0.0–0.2)
nRBC: 0 % (ref 0.0–0.2)

## 2019-04-27 LAB — BASIC METABOLIC PANEL
Anion gap: 12 (ref 5–15)
BUN: <5 mg/dL — ABNORMAL LOW (ref 6–20)
CO2: 14 mmol/L — ABNORMAL LOW (ref 22–32)
Calcium: 8.6 mg/dL — ABNORMAL LOW (ref 8.9–10.3)
Chloride: 115 mmol/L — ABNORMAL HIGH (ref 98–111)
Creatinine, Ser: 0.61 mg/dL (ref 0.44–1.00)
GFR calc Af Amer: >60 mL/min (ref >60)
GFR calc non Af Amer: >60 mL/min (ref >60)
Glucose, Bld: 142 mg/dL — ABNORMAL HIGH (ref 70–99)
Potassium: 4.2 mmol/L (ref 3.5–5.1)
Sodium: 141 mmol/L (ref 135–145)

## 2019-04-27 LAB — HIV ANTIBODY (ROUTINE TESTING W REFLEX): HIV Screen 4th Generation wRfx: NONREACTIVE

## 2019-04-27 LAB — SAMPLE TO BLOOD BANK

## 2019-04-27 LAB — COMPREHENSIVE METABOLIC PANEL
ALT: 82 U/L — ABNORMAL HIGH (ref 0–44)
AST: 231 U/L — ABNORMAL HIGH (ref 15–41)
Albumin: 3.4 g/dL — ABNORMAL LOW (ref 3.5–5.0)
Alkaline Phosphatase: 204 U/L — ABNORMAL HIGH (ref 38–126)
Anion gap: 21 — ABNORMAL HIGH (ref 5–15)
BUN: <5 mg/dL — ABNORMAL LOW (ref 6–20)
CO2: 14 mmol/L — ABNORMAL LOW (ref 22–32)
Calcium: 8.1 mg/dL — ABNORMAL LOW (ref 8.9–10.3)
Chloride: 102 mmol/L (ref 98–111)
Creatinine, Ser: 0.75 mg/dL (ref 0.44–1.00)
GFR calc Af Amer: >60 mL/min (ref >60)
GFR calc non Af Amer: >60 mL/min (ref >60)
Glucose, Bld: 259 mg/dL — ABNORMAL HIGH (ref 70–99)
Potassium: 4.2 mmol/L (ref 3.5–5.1)
Sodium: 137 mmol/L (ref 135–145)
Total Bilirubin: 0.4 mg/dL (ref 0.3–1.2)
Total Protein: 7.7 g/dL (ref 6.5–8.1)

## 2019-04-27 LAB — URINALYSIS, ROUTINE W REFLEX MICROSCOPIC
Bacteria, UA: NONE SEEN
Bilirubin Urine: NEGATIVE
Glucose, UA: 50 mg/dL — AB
Ketones, ur: 5 mg/dL — AB
Leukocytes,Ua: NEGATIVE
Nitrite: NEGATIVE
Protein, ur: NEGATIVE mg/dL
Specific Gravity, Urine: 1.008 (ref 1.005–1.030)
pH: 6 (ref 5.0–8.0)

## 2019-04-27 LAB — RESPIRATORY PANEL BY RT PCR (FLU A&B, COVID)
Influenza A by PCR: NEGATIVE
Influenza B by PCR: NEGATIVE
SARS Coronavirus 2 by RT PCR: NEGATIVE

## 2019-04-27 LAB — ETHANOL: Alcohol, Ethyl (B): 388 mg/dL (ref <10)

## 2019-04-27 LAB — RAPID URINE DRUG SCREEN, HOSP PERFORMED
Amphetamines: NOT DETECTED
Barbiturates: NOT DETECTED
Benzodiazepines: NOT DETECTED
Cocaine: NOT DETECTED
Opiates: NOT DETECTED
Tetrahydrocannabinol: NOT DETECTED

## 2019-04-27 LAB — I-STAT CHEM 8, ED
BUN: <3 mg/dL — ABNORMAL LOW (ref 6–20)
Calcium, Ion: 0.97 mmol/L — ABNORMAL LOW (ref 1.15–1.40)
Chloride: 103 mmol/L (ref 98–111)
Creatinine, Ser: 1 mg/dL (ref 0.44–1.00)
Glucose, Bld: 256 mg/dL — ABNORMAL HIGH (ref 70–99)
HCT: 33 % — ABNORMAL LOW (ref 36.0–46.0)
Hemoglobin: 11.2 g/dL — ABNORMAL LOW (ref 12.0–15.0)
Potassium: 4 mmol/L (ref 3.5–5.1)
Sodium: 137 mmol/L (ref 135–145)
TCO2: 16 mmol/L — ABNORMAL LOW (ref 22–32)

## 2019-04-27 LAB — I-STAT BETA HCG BLOOD, ED (MC, WL, AP ONLY): I-stat hCG, quantitative: <5 m[IU]/mL (ref <5)

## 2019-04-27 LAB — PROTIME-INR
INR: 1.2 (ref 0.8–1.2)
Prothrombin Time: 14.8 seconds (ref 11.4–15.2)

## 2019-04-27 LAB — MRSA PCR SCREENING: MRSA by PCR: NEGATIVE

## 2019-04-27 LAB — ABO/RH: ABO/RH(D): O POS

## 2019-04-27 LAB — PREPARE RBC (CROSSMATCH)

## 2019-04-27 LAB — CBG MONITORING, ED: Glucose-Capillary: 217 mg/dL — ABNORMAL HIGH (ref 70–99)

## 2019-04-27 LAB — LACTIC ACID, PLASMA: Lactic Acid, Venous: 9 mmol/L (ref 0.5–1.9)

## 2019-04-27 SURGERY — CRANIOTOMY HEMATOMA EVACUATION EPIDURAL
Anesthesia: General | Site: Head | Laterality: Left

## 2019-04-27 MED ORDER — PROMETHAZINE HCL 25 MG PO TABS: 12.5000 mg | ORAL_TABLET | ORAL | Status: DC | PRN

## 2019-04-27 MED ORDER — CHLORHEXIDINE GLUCONATE CLOTH 2 % EX PADS
6.0000 | MEDICATED_PAD | Freq: Every day | CUTANEOUS | Status: DC
  Administered 2019-04-27 – 2019-05-08 (×12): 6 via TOPICAL

## 2019-04-27 MED ORDER — CEFAZOLIN SODIUM-DEXTROSE 2-3 GM-%(50ML) IV SOLR
INTRAVENOUS | Status: DC | PRN
  Administered 2019-04-27: 2 g via INTRAVENOUS

## 2019-04-27 MED ORDER — FENTANYL CITRATE (PF) 100 MCG/2ML IJ SOLN
INTRAMUSCULAR | Status: DC | PRN
  Administered 2019-04-27 (×2): 100 ug via INTRAVENOUS
  Administered 2019-04-27 (×2): 50 ug via INTRAVENOUS
  Administered 2019-04-27: 100 ug via INTRAVENOUS
  Administered 2019-04-27 (×2): 50 ug via INTRAVENOUS

## 2019-04-27 MED ORDER — ONDANSETRON HCL 4 MG/2ML IJ SOLN: 4.0000 mg | INTRAMUSCULAR | Status: DC | PRN

## 2019-04-27 MED ORDER — ESMOLOL HCL 100 MG/10ML IV SOLN
INTRAVENOUS | Status: AC
  Filled 2019-04-27: qty 10

## 2019-04-27 MED ORDER — CEFAZOLIN SODIUM 1 G IJ SOLR
INTRAMUSCULAR | Status: AC
  Filled 2019-04-27: qty 20

## 2019-04-27 MED ORDER — PANTOPRAZOLE SODIUM 40 MG PO TBEC
40.0000 mg | DELAYED_RELEASE_TABLET | Freq: Every day | ORAL | Status: DC
  Filled 2019-04-27: qty 1

## 2019-04-27 MED ORDER — MIDAZOLAM HCL 2 MG/2ML IJ SOLN
4.0000 mg | INTRAMUSCULAR | Status: DC | PRN
  Administered 2019-04-27 (×2): 4 mg via INTRAVENOUS
  Filled 2019-04-27 (×3): qty 4

## 2019-04-27 MED ORDER — PHENYLEPHRINE HCL-NACL 10-0.9 MG/250ML-% IV SOLN
INTRAVENOUS | Status: DC | PRN
  Administered 2019-04-27: 20 ug/min via INTRAVENOUS

## 2019-04-27 MED ORDER — ESMOLOL HCL 100 MG/10ML IV SOLN
INTRAVENOUS | Status: DC | PRN
  Administered 2019-04-27: 70 mg via INTRAVENOUS
  Administered 2019-04-27: 30 mg via INTRAVENOUS

## 2019-04-27 MED ORDER — LIDOCAINE-EPINEPHRINE 1 %-1:100000 IJ SOLN
INTRAMUSCULAR | Status: DC | PRN
  Administered 2019-04-27: 10 mL via INTRADERMAL

## 2019-04-27 MED ORDER — DEXAMETHASONE SODIUM PHOSPHATE 10 MG/ML IJ SOLN
INTRAMUSCULAR | Status: AC
  Filled 2019-04-27: qty 1

## 2019-04-27 MED ORDER — LACTATED RINGERS IV SOLN: INTRAVENOUS | Status: DC | PRN

## 2019-04-27 MED ORDER — CHLORHEXIDINE GLUCONATE 0.12% ORAL RINSE (MEDLINE KIT)
15.0000 mL | Freq: Two times a day (BID) | OROMUCOSAL | Status: DC
  Administered 2019-04-27 – 2019-05-08 (×23): 15 mL via OROMUCOSAL

## 2019-04-27 MED ORDER — SODIUM CHLORIDE 0.9 % IV SOLN
INTRAVENOUS | Status: DC | PRN
  Administered 2019-04-27: 500 mL

## 2019-04-27 MED ORDER — THROMBIN 20000 UNITS EX SOLR
CUTANEOUS | Status: DC | PRN
  Administered 2019-04-27: 20 mL via TOPICAL

## 2019-04-27 MED ORDER — CALCIUM CHLORIDE 10 % IV SOLN
INTRAVENOUS | Status: DC | PRN
  Administered 2019-04-27 (×5): 200 mg via INTRAVENOUS

## 2019-04-27 MED ORDER — PROPOFOL 500 MG/50ML IV EMUL
INTRAVENOUS | Status: DC | PRN
  Administered 2019-04-27: 100 ug/kg/min via INTRAVENOUS

## 2019-04-27 MED ORDER — ALBUMIN HUMAN 5 % IV SOLN: INTRAVENOUS | Status: DC | PRN

## 2019-04-27 MED ORDER — FENTANYL CITRATE (PF) 250 MCG/5ML IJ SOLN
INTRAMUSCULAR | Status: AC
  Filled 2019-04-27: qty 5

## 2019-04-27 MED ORDER — ROCURONIUM BROMIDE 10 MG/ML (PF) SYRINGE
PREFILLED_SYRINGE | INTRAVENOUS | Status: AC
  Filled 2019-04-27: qty 20

## 2019-04-27 MED ORDER — SODIUM CHLORIDE 0.9 % IV SOLN
10.0000 mL/h | Freq: Once | INTRAVENOUS | Status: AC
  Administered 2019-04-27: 10 mL/h via INTRAVENOUS

## 2019-04-27 MED ORDER — PHENYLEPHRINE 40 MCG/ML (10ML) SYRINGE FOR IV PUSH (FOR BLOOD PRESSURE SUPPORT)
PREFILLED_SYRINGE | INTRAVENOUS | Status: DC | PRN
  Administered 2019-04-27 (×3): 80 ug via INTRAVENOUS

## 2019-04-27 MED ORDER — DEXAMETHASONE SODIUM PHOSPHATE 10 MG/ML IJ SOLN
INTRAMUSCULAR | Status: DC | PRN
  Administered 2019-04-27: 10 mg via INTRAVENOUS

## 2019-04-27 MED ORDER — SODIUM CHLORIDE 0.9 % IV SOLN
INTRAVENOUS | Status: AC | PRN
  Administered 2019-04-27 (×2): 1000 mL via INTRAVENOUS

## 2019-04-27 MED ORDER — 0.9 % SODIUM CHLORIDE (POUR BTL) OPTIME
TOPICAL | Status: DC | PRN
  Administered 2019-04-27 (×2): 1000 mL

## 2019-04-27 MED ORDER — LIDOCAINE-EPINEPHRINE 1 %-1:100000 IJ SOLN
INTRAMUSCULAR | Status: AC
  Filled 2019-04-27: qty 1

## 2019-04-27 MED ORDER — MANNITOL 25 % IV SOLN
INTRAVENOUS | Status: DC | PRN
  Administered 2019-04-27: 12.5 g via INTRAVENOUS

## 2019-04-27 MED ORDER — LEVETIRACETAM IN NACL 500 MG/100ML IV SOLN
500.0000 mg | Freq: Two times a day (BID) | INTRAVENOUS | Status: AC
  Administered 2019-04-27 – 2019-05-04 (×14): 500 mg via INTRAVENOUS
  Filled 2019-04-27 (×14): qty 100

## 2019-04-27 MED ORDER — THROMBIN 20000 UNITS EX SOLR
CUTANEOUS | Status: AC
  Filled 2019-04-27: qty 20000

## 2019-04-27 MED ORDER — PANTOPRAZOLE SODIUM 40 MG IV SOLR
40.0000 mg | Freq: Every day | INTRAVENOUS | Status: DC
  Administered 2019-04-27 – 2019-05-03 (×7): 40 mg via INTRAVENOUS
  Filled 2019-04-27 (×7): qty 40

## 2019-04-27 MED ORDER — ACETAMINOPHEN 160 MG/5ML PO SOLN
650.0000 mg | Freq: Four times a day (QID) | ORAL | Status: DC | PRN
  Administered 2019-04-27: 650 mg
  Filled 2019-04-27: qty 20.3

## 2019-04-27 MED ORDER — THROMBIN 5000 UNITS EX SOLR
OROMUCOSAL | Status: DC | PRN
  Administered 2019-04-27: 5 mL via TOPICAL

## 2019-04-27 MED ORDER — SUCCINYLCHOLINE CHLORIDE 20 MG/ML IJ SOLN
INTRAMUSCULAR | Status: AC | PRN
  Administered 2019-04-27: 120 mg via INTRAVENOUS

## 2019-04-27 MED ORDER — MICROFIBRILLAR COLL HEMOSTAT EX POWD
CUTANEOUS | Status: AC
  Filled 2019-04-27: qty 5

## 2019-04-27 MED ORDER — ONDANSETRON HCL 4 MG PO TABS: 4.0000 mg | ORAL_TABLET | ORAL | Status: DC | PRN

## 2019-04-27 MED ORDER — ROCURONIUM BROMIDE 50 MG/5ML IV SOSY
PREFILLED_SYRINGE | INTRAVENOUS | Status: DC | PRN
  Administered 2019-04-27: 100 mg via INTRAVENOUS
  Administered 2019-04-27 (×2): 50 mg via INTRAVENOUS

## 2019-04-27 MED ORDER — ETOMIDATE 2 MG/ML IV SOLN
INTRAVENOUS | Status: AC | PRN
  Administered 2019-04-27: 30 mg via INTRAVENOUS

## 2019-04-27 MED ORDER — BACITRACIN ZINC 500 UNIT/GM EX OINT
TOPICAL_OINTMENT | CUTANEOUS | Status: DC | PRN
  Administered 2019-04-27: 1 via TOPICAL

## 2019-04-27 MED ORDER — PANTOPRAZOLE SODIUM 40 MG IV SOLR: 40.0000 mg | Freq: Every day | INTRAVENOUS | Status: DC

## 2019-04-27 MED ORDER — POTASSIUM CHLORIDE IN NACL 20-0.9 MEQ/L-% IV SOLN
INTRAVENOUS | Status: DC
  Filled 2019-04-27: qty 1000

## 2019-04-27 MED ORDER — ARTIFICIAL TEARS OPHTHALMIC OINT
TOPICAL_OINTMENT | OPHTHALMIC | Status: DC | PRN
  Administered 2019-04-27: 1 via OPHTHALMIC

## 2019-04-27 MED ORDER — DIPHENHYDRAMINE HCL 50 MG/ML IJ SOLN
INTRAMUSCULAR | Status: DC | PRN
  Administered 2019-04-27: 50 mg via INTRAVENOUS

## 2019-04-27 MED ORDER — LABETALOL HCL 5 MG/ML IV SOLN
10.0000 mg | INTRAVENOUS | Status: DC | PRN
  Administered 2019-04-27: 20 mg via INTRAVENOUS
  Filled 2019-04-27: qty 8

## 2019-04-27 MED ORDER — ORAL CARE MOUTH RINSE
15.0000 mL | OROMUCOSAL | Status: DC
  Administered 2019-04-27 – 2019-05-08 (×110): 15 mL via OROMUCOSAL

## 2019-04-27 MED ORDER — HEMOSTATIC AGENTS (NO CHARGE) OPTIME
TOPICAL | Status: DC | PRN
  Administered 2019-04-27: 1 via TOPICAL

## 2019-04-27 MED ORDER — PROPOFOL 10 MG/ML IV BOLUS
INTRAVENOUS | Status: AC
  Filled 2019-04-27: qty 20

## 2019-04-27 MED ORDER — POTASSIUM CHLORIDE IN NACL 20-0.9 MEQ/L-% IV SOLN
INTRAVENOUS | Status: DC
  Filled 2019-04-27 (×2): qty 1000

## 2019-04-27 MED ORDER — PROPOFOL 1000 MG/100ML IV EMUL
INTRAVENOUS | Status: AC
  Filled 2019-04-27: qty 100

## 2019-04-27 MED ORDER — BACITRACIN ZINC 500 UNIT/GM EX OINT
TOPICAL_OINTMENT | CUTANEOUS | Status: AC
  Filled 2019-04-27: qty 28.35

## 2019-04-27 MED ORDER — THROMBIN 5000 UNITS EX SOLR
CUTANEOUS | Status: AC
  Filled 2019-04-27: qty 5000

## 2019-04-27 MED ORDER — FENTANYL CITRATE (PF) 100 MCG/2ML IJ SOLN
100.0000 ug | INTRAMUSCULAR | Status: DC | PRN
  Administered 2019-04-27 – 2019-04-28 (×3): 100 ug via INTRAVENOUS
  Filled 2019-04-27 (×3): qty 2

## 2019-04-27 MED ORDER — MORPHINE SULFATE (PF) 2 MG/ML IV SOLN: 1.0000 mg | INTRAVENOUS | Status: DC | PRN

## 2019-04-27 MED ORDER — CEFAZOLIN SODIUM-DEXTROSE 1-4 GM/50ML-% IV SOLN
1.0000 g | Freq: Three times a day (TID) | INTRAVENOUS | Status: AC
  Administered 2019-04-27 – 2019-04-28 (×3): 1 g via INTRAVENOUS
  Filled 2019-04-27 (×3): qty 50

## 2019-04-27 SURGICAL SUPPLY — 73 items
BAG DECANTER FOR FLEXI CONT (MISCELLANEOUS) IMPLANT
BATTERY IQ STERILE (MISCELLANEOUS) IMPLANT
BIT DRILL WIRE PASS 1.3MM (BIT) IMPLANT
BLADE CLIPPER SPEC (BLADE) ×3 IMPLANT
BUR ACORN 6.0 PRECISION (BURR) ×2 IMPLANT
BUR ACORN 6.0MM PRECISION (BURR) ×1
BUR PRECISION FLUTE 6.0 (BURR) ×3 IMPLANT
BUR RND OSTEON ELITE 6.0 (BURR) ×2 IMPLANT
BUR RND OSTEON ELITE 6.0MM (BURR) ×1
BUR SPIRAL ROUTER 2.3 (BUR) ×2 IMPLANT
BUR SPIRAL ROUTER 2.3MM (BUR) ×1
CANISTER SUCT 3000ML PPV (MISCELLANEOUS) ×3 IMPLANT
CARTRIDGE OIL MAESTRO DRILL (MISCELLANEOUS) ×1 IMPLANT
CATH VENTRIC 35X38 W/TROCAR LG (CATHETERS) ×3 IMPLANT
CLIP VESOCCLUDE MED 6/CT (CLIP) IMPLANT
COVER BACK TABLE 60X90IN (DRAPES) IMPLANT
COVER WAND RF STERILE (DRAPES) ×3 IMPLANT
DIFFUSER DRILL AIR PNEUMATIC (MISCELLANEOUS) ×3 IMPLANT
DRAPE NEUROLOGICAL W/INCISE (DRAPES) ×3 IMPLANT
DRAPE SURG 17X23 STRL (DRAPES) IMPLANT
DRAPE WARM FLUID 44X44 (DRAPES) ×3 IMPLANT
DRILL WIRE PASS 1.3MM (BIT)
ELECT REM PT RETURN 9FT ADLT (ELECTROSURGICAL) ×3
ELECTRODE REM PT RTRN 9FT ADLT (ELECTROSURGICAL) ×1 IMPLANT
EVACUATOR 1/8 PVC DRAIN (DRAIN) IMPLANT
EVACUATOR SILICONE 100CC (DRAIN) IMPLANT
GAUZE 4X4 16PLY RFD (DISPOSABLE) IMPLANT
GAUZE SPONGE 4X4 12PLY STRL (GAUZE/BANDAGES/DRESSINGS) ×3 IMPLANT
GLOVE BIO SURGEON STRL SZ 6.5 (GLOVE) ×4 IMPLANT
GLOVE BIO SURGEON STRL SZ7 (GLOVE) ×6 IMPLANT
GLOVE BIO SURGEON STRL SZ8 (GLOVE) ×3 IMPLANT
GLOVE BIO SURGEON STRL SZ8.5 (GLOVE) ×3 IMPLANT
GLOVE BIO SURGEONS STRL SZ 6.5 (GLOVE) ×2
GLOVE BIOGEL PI IND STRL 6.5 (GLOVE) ×1 IMPLANT
GLOVE BIOGEL PI INDICATOR 6.5 (GLOVE) ×2
GLOVE EXAM NITRILE XL STR (GLOVE) IMPLANT
GOWN STRL REUS W/ TWL LRG LVL3 (GOWN DISPOSABLE) ×2 IMPLANT
GOWN STRL REUS W/ TWL XL LVL3 (GOWN DISPOSABLE) ×1 IMPLANT
GOWN STRL REUS W/TWL LRG LVL3 (GOWN DISPOSABLE) ×4
GOWN STRL REUS W/TWL XL LVL3 (GOWN DISPOSABLE) ×2
KIT BASIN OR (CUSTOM PROCEDURE TRAY) ×3 IMPLANT
KIT TURNOVER KIT B (KITS) ×3 IMPLANT
MARKER SKIN DUAL TIP RULER LAB (MISCELLANEOUS) IMPLANT
NEEDLE HYPO 22GX1.5 SAFETY (NEEDLE) ×3 IMPLANT
NS IRRIG 1000ML POUR BTL (IV SOLUTION) ×3 IMPLANT
OIL CARTRIDGE MAESTRO DRILL (MISCELLANEOUS) ×3
PACK BATTERY CMF DISP FOR DVR (ORTHOPEDIC DISPOSABLE SUPPLIES) ×3 IMPLANT
PACK CRANIOTOMY CUSTOM (CUSTOM PROCEDURE TRAY) ×3 IMPLANT
PAD ARMBOARD 7.5X6 YLW CONV (MISCELLANEOUS) ×3 IMPLANT
PATTIES SURGICAL .25X.25 (GAUZE/BANDAGES/DRESSINGS) IMPLANT
PATTIES SURGICAL .5 X.5 (GAUZE/BANDAGES/DRESSINGS) IMPLANT
PATTIES SURGICAL .5 X3 (DISPOSABLE) IMPLANT
PATTIES SURGICAL 1X1 (DISPOSABLE) IMPLANT
PIN MAYFIELD SKULL DISP (PIN) ×3 IMPLANT
PLATE CRANIAL 12 2H RIGID UNI (Plate) ×9 IMPLANT
RUBBERBAND STERILE (MISCELLANEOUS) IMPLANT
SCREW UNIII AXS SD 1.5X4 (Screw) ×18 IMPLANT
SPONGE NEURO XRAY DETECT 1X3 (DISPOSABLE) IMPLANT
SPONGE SURGIFOAM ABS GEL 100 (HEMOSTASIS) ×3 IMPLANT
STAPLER SKIN PROX WIDE 3.9 (STAPLE) ×3 IMPLANT
SUT ETHILON 3 0 FSL (SUTURE) ×6 IMPLANT
SUT ETHILON O TP 1 (SUTURE) ×6 IMPLANT
SUT NURALON 4 0 TR CR/8 (SUTURE) ×6 IMPLANT
SUT PROLENE 6 0 BV (SUTURE) IMPLANT
SUT VIC AB 2-0 CP2 18 (SUTURE) ×6 IMPLANT
SUT VIC AB 3-0 FS2 27 (SUTURE) IMPLANT
SUT VICRYL 4-0 PS2 18IN ABS (SUTURE) IMPLANT
TAPE PAPER 3X10 WHT MICROPORE (GAUZE/BANDAGES/DRESSINGS) ×3 IMPLANT
TOWEL GREEN STERILE (TOWEL DISPOSABLE) ×3 IMPLANT
TOWEL GREEN STERILE FF (TOWEL DISPOSABLE) ×3 IMPLANT
TRAY FOLEY MTR SLVR 16FR STAT (SET/KITS/TRAYS/PACK) IMPLANT
UNDERPAD 30X30 (UNDERPADS AND DIAPERS) IMPLANT
WATER STERILE IRR 1000ML POUR (IV SOLUTION) ×3 IMPLANT
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
## 2019-04-27 NOTE — Progress Notes (Signed)
Patient's head wound prior to craniotomy

## 2019-04-27 NOTE — Progress Notes (Signed)
Patient transported on vent to OR without complications.  Vent placed on standby.

## 2019-04-27 NOTE — Anesthesia Preprocedure Evaluation (Signed)
Anesthesia Evaluation  Patient identified by MRN, date of birth, ID band Patient unresponsive  General Assessment Comment:Intubated  Reviewed: Allergy & Precautions, H&P , NPO status , Patient's Chart, lab work & pertinent test results  Airway Mallampati: Intubated       Dental   Pulmonary neg pulmonary ROS,    breath sounds clear to auscultation       Cardiovascular negative cardio ROS   Rhythm:regular Rate:Tachycardia     Neuro/Psych Intracranial epidural hematoma    GI/Hepatic   Endo/Other    Renal/GU      Musculoskeletal   Abdominal   Peds  Hematology   Anesthesia Other Findings   Reproductive/Obstetrics                             Anesthesia Physical Anesthesia Plan  ASA: III and emergent  Anesthesia Plan: General   Post-op Pain Management:    Induction: Intravenous  PONV Risk Score and Plan: 3 and Ondansetron, Dexamethasone, Midazolam and Treatment may vary due to age or medical condition  Airway Management Planned: Oral ETT  Additional Equipment: Arterial line  Intra-op Plan:   Post-operative Plan: Post-operative intubation/ventilation  Informed Consent: I have reviewed the patients History and Physical, chart, labs and discussed the procedure including the risks, benefits and alternatives for the proposed anesthesia with the patient or authorized representative who has indicated his/her understanding and acceptance.       Plan Discussed with: CRNA, Anesthesiologist and Surgeon  Anesthesia Plan Comments:         Anesthesia Quick Evaluation
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
## 2019-04-27 NOTE — Progress Notes (Signed)
Contact Detective E. Madie Reno [phone] if the patient passes away.
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
## 2019-04-27 NOTE — Anesthesia Procedure Notes (Signed)
Arterial Line Insertion Start/End03/30/2021 7:55 AM, 04/29/19 7:57 AM Performed by: Rosiland Oz, CRNA, CRNA  Patient location: OR. Preanesthetic checklist: patient identified, IV checked, site marked, risks and benefits discussed, surgical consent, monitors and equipment checked, pre-op evaluation, timeout performed and anesthesia consent Right, radial was placed Catheter size: 20 G Hand hygiene performed , maximum sterile barriers used  and Seldinger technique used Allen's test indicative of satisfactory collateral circulation Attempts: 1 Procedure performed without using ultrasound guided technique. Following insertion, dressing applied and Biopatch. Post procedure assessment: normal  Patient tolerated the procedure well with no immediate complications.

## 2019-04-27 NOTE — Transfer of Care (Signed)
Immediate Anesthesia Transfer of Care Note  Patient: Susan Mcconnell  Procedure(s) Performed: CRANIOTOMY HEMATOMA EVACUATION EPIDURAL, CLOSURE OF RIGHT SCALP LACERATION (Left Head)  Patient Location: ICU  Anesthesia Type:General  Level of Consciousness: sedated and Patient remains intubated per anesthesia plan  Airway & Oxygen Therapy: Patient remains intubated per anesthesia plan and Patient placed on Ventilator (see vital sign flow sheet for setting)  Post-op Assessment: Report given to RN and Post -op Vital signs reviewed and stable  Post vital signs: Reviewed and stable  Last Vitals:  Vitals Value Taken Time  BP 110/78 04/16/2019 1053  Temp 37 C 04/30/2019 1106  Pulse 110 04/30/2019 1106  Resp 16 04/21/2019 1106  SpO2 100 % 04/02/2019 1106  Vitals shown include unvalidated device data.  Last Pain:  Vitals:   04/16/2019 1050  TempSrc: Axillary         Complications: No apparent anesthesia complications

## 2019-04-27 NOTE — Consult Note (Signed)
Responded to page, pt unavailable, no family present, staff will page again if further chaplain services needed.  Rev. Ersie Savino Chaplain 
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
## 2019-04-27 NOTE — ED Notes (Signed)
Dr. Janee Morn speaking with NS this time

## 2019-04-27 NOTE — Progress Notes (Signed)
RT pulled back 1 cm per MD order 21 lips 19 @ teeth

## 2019-04-27 NOTE — ED Notes (Signed)
RT called requesting reposition of ET tube
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
## 2019-04-28 ENCOUNTER — Inpatient Hospital Stay (HOSPITAL_COMMUNITY): Payer: Medicaid Other

## 2019-04-28 ENCOUNTER — Encounter: Payer: Self-pay | Admitting: *Deleted

## 2019-04-28 LAB — CBC
HCT: 29 % — ABNORMAL LOW (ref 36.0–46.0)
Hemoglobin: 9.7 g/dL — ABNORMAL LOW (ref 12.0–15.0)
MCH: 30.6 pg (ref 26.0–34.0)
MCHC: 33.4 g/dL (ref 30.0–36.0)
MCV: 91.5 fL (ref 80.0–100.0)
Platelets: 66 10*3/uL — ABNORMAL LOW (ref 150–400)
RBC: 3.17 MIL/uL — ABNORMAL LOW (ref 3.87–5.11)
RDW: 21.2 % — ABNORMAL HIGH (ref 11.5–15.5)
WBC: 16 10*3/uL — ABNORMAL HIGH (ref 4.0–10.5)
nRBC: 0 % (ref 0.0–0.2)

## 2019-04-28 LAB — BASIC METABOLIC PANEL
Anion gap: 10 (ref 5–15)
BUN: <5 mg/dL — ABNORMAL LOW (ref 6–20)
CO2: 20 mmol/L — ABNORMAL LOW (ref 22–32)
Calcium: 8.1 mg/dL — ABNORMAL LOW (ref 8.9–10.3)
Chloride: 112 mmol/L — ABNORMAL HIGH (ref 98–111)
Creatinine, Ser: 0.59 mg/dL (ref 0.44–1.00)
GFR calc Af Amer: >60 mL/min (ref >60)
GFR calc non Af Amer: >60 mL/min (ref >60)
Glucose, Bld: 119 mg/dL — ABNORMAL HIGH (ref 70–99)
Potassium: 4 mmol/L (ref 3.5–5.1)
Sodium: 142 mmol/L (ref 135–145)

## 2019-04-28 LAB — GLUCOSE, CAPILLARY
Glucose-Capillary: 119 mg/dL — ABNORMAL HIGH (ref 70–99)
Glucose-Capillary: 125 mg/dL — ABNORMAL HIGH (ref 70–99)
Glucose-Capillary: 146 mg/dL — ABNORMAL HIGH (ref 70–99)
Glucose-Capillary: 150 mg/dL — ABNORMAL HIGH (ref 70–99)

## 2019-04-28 LAB — OSMOLALITY, URINE: Osmolality, Ur: 485 mOsm/kg (ref 300–900)

## 2019-04-28 LAB — POCT I-STAT 7, (LYTES, BLD GAS, ICA,H+H)
Bicarbonate: 23.8 mmol/L (ref 20.0–28.0)
Calcium, Ion: 1.18 mmol/L (ref 1.15–1.40)
HCT: 25 % — ABNORMAL LOW (ref 36.0–46.0)
Hemoglobin: 8.5 g/dL — ABNORMAL LOW (ref 12.0–15.0)
O2 Saturation: 99 %
Patient temperature: 37.9
Potassium: 3.2 mmol/L — ABNORMAL LOW (ref 3.5–5.1)
Sodium: 145 mmol/L (ref 135–145)
TCO2: 25 mmol/L (ref 22–32)
pCO2 arterial: 34.6 mmHg (ref 32.0–48.0)
pH, Arterial: 7.449 (ref 7.350–7.450)
pO2, Arterial: 118 mmHg — ABNORMAL HIGH (ref 83.0–108.0)

## 2019-04-28 LAB — MAGNESIUM: Magnesium: 1.6 mg/dL — ABNORMAL LOW (ref 1.7–2.4)

## 2019-04-28 LAB — SODIUM
Sodium: 141 mmol/L (ref 135–145)
Sodium: 141 mmol/L (ref 135–145)

## 2019-04-28 LAB — OSMOLALITY: Osmolality: 286 mOsm/kg (ref 275–295)

## 2019-04-28 LAB — SODIUM, URINE, RANDOM: Sodium, Ur: 175 mmol/L

## 2019-04-28 LAB — PHOSPHORUS: Phosphorus: 3.3 mg/dL (ref 2.5–4.6)

## 2019-04-28 MED ORDER — FENTANYL 2500MCG IN NS 250ML (10MCG/ML) PREMIX INFUSION
50.0000 ug/h | INTRAVENOUS | Status: DC
  Administered 2019-04-29: 50 ug/h via INTRAVENOUS
  Administered 2019-05-01: 75 ug/h via INTRAVENOUS
  Administered 2019-05-02 – 2019-05-08 (×3): 50 ug/h via INTRAVENOUS
  Filled 2019-04-28 (×6): qty 250

## 2019-04-28 MED ORDER — FENTANYL CITRATE (PF) 100 MCG/2ML IJ SOLN
25.0000 ug | INTRAMUSCULAR | Status: DC | PRN
  Administered 2019-04-28: 25 ug via INTRAVENOUS
  Administered 2019-04-28 – 2019-05-01 (×8): 50 ug via INTRAVENOUS
  Filled 2019-04-28 (×8): qty 2

## 2019-04-28 MED ORDER — PIVOT 1.5 CAL PO LIQD
1000.0000 mL | ORAL | Status: DC
  Administered 2019-04-28: 1000 mL

## 2019-04-28 MED ORDER — PIVOT 1.5 CAL PO LIQD
1000.0000 mL | ORAL | Status: DC
  Administered 2019-04-29 – 2019-04-30 (×2): 1000 mL

## 2019-04-28 MED ORDER — LORAZEPAM 2 MG/ML IJ SOLN
1.0000 mg | INTRAMUSCULAR | Status: DC | PRN
  Administered 2019-04-30 – 2019-05-07 (×10): 2 mg via INTRAVENOUS
  Filled 2019-04-28 (×10): qty 1

## 2019-04-28 MED ORDER — MAGNESIUM SULFATE 2 GM/50ML IV SOLN
2.0000 g | Freq: Once | INTRAVENOUS | Status: AC
  Administered 2019-04-28: 2 g via INTRAVENOUS
  Filled 2019-04-28: qty 50

## 2019-04-28 MED ORDER — MIDAZOLAM HCL 2 MG/2ML IJ SOLN
0.5000 mg | INTRAMUSCULAR | Status: DC | PRN
  Administered 2019-04-29 – 2019-05-07 (×2): 2 mg via INTRAVENOUS
  Filled 2019-04-28 (×2): qty 2

## 2019-04-28 MED ORDER — SODIUM CHLORIDE 3 % IV SOLN
INTRAVENOUS | Status: DC
  Administered 2019-04-28 – 2019-04-29 (×2): 25 mL/h via INTRAVENOUS
  Filled 2019-04-28 (×2): qty 500

## 2019-04-28 MED ORDER — OXYCODONE HCL 5 MG/5ML PO SOLN: 5.0000 mg | ORAL | Status: DC | PRN

## 2019-04-28 MED ORDER — VITAL HIGH PROTEIN PO LIQD: 1000.0000 mL | ORAL | Status: DC

## 2019-04-28 MED ORDER — FENTANYL BOLUS VIA INFUSION
25.0000 ug | INTRAVENOUS | Status: DC | PRN
  Filled 2019-04-28: qty 50

## 2019-04-28 MED ORDER — SODIUM CHLORIDE 0.9 % IV SOLN: INTRAVENOUS | Status: DC

## 2019-04-28 MED ORDER — PRO-STAT SUGAR FREE PO LIQD
30.0000 mL | Freq: Two times a day (BID) | ORAL | Status: DC
  Administered 2019-04-28: 10:00:00 30 mL
  Filled 2019-04-28: qty 30

## 2019-04-28 MED ORDER — ACETAMINOPHEN 160 MG/5ML PO SOLN
650.0000 mg | Freq: Four times a day (QID) | ORAL | Status: DC
  Administered 2019-04-28 – 2019-05-08 (×41): 650 mg
  Filled 2019-04-28 (×39): qty 20.3

## 2019-04-28 MED ORDER — DOCUSATE SODIUM 50 MG/5ML PO LIQD
100.0000 mg | Freq: Two times a day (BID) | ORAL | Status: DC
  Administered 2019-04-28 – 2019-05-07 (×14): 100 mg
  Filled 2019-04-28 (×14): qty 10

## 2019-04-28 NOTE — Procedures (Signed)
Cortrak  Tube Type:  Cortrak - 43 inches Tube Location:  Left nare Initial Placement:  Stomach Secured by: Bridle Technique Used to Measure Tube Placement:  Documented cm marking at nare/ corner of mouth Cortrak Secured At:  62 cm    Cortrak Tube Team Note:  Consult received to place a Cortrak feeding tube.   No x-ray is required. RN may begin using tube.   If the tube becomes dislodged please keep the tube and contact the Cortrak team at [email or web address] ([email or web address]) for replacement.  If after hours and replacement cannot be delayed, place a NG tube and confirm placement with an abdominal x-ray.    Betsey Holiday MS, RD, LDN Contact information available in Amion

## 2019-04-28 NOTE — Progress Notes (Signed)
Pt had an episode of  oxygen desaturation from 100% to 60%. MD Lovick at the bedside placed pt back on full support. Pt bagged by RT for approx 2-3 minutes. O2 back up to 98% at this time, will continue to monitor
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
## 2019-04-28 NOTE — TOC Progression Note (Signed)
Transition of Care [facility]) - Progression Note    Patient Details  Name: Susan Mcconnell MRN: [id number] Date of Birth: [birth year]  Transition of Care [facility]) CM/SW Contact  Jimmy Picket, [place] Phone Number: 04/28/2019, 1:45 PM  Clinical Narrative:     CSW was unable to complete SBIRT due to pt being intubated. CSW will attempt again when more appropriate.   Jimmy Picket, Theresia Majors, LCASA Licensed Visual merchandiser

## 2019-04-28 NOTE — Progress Notes (Signed)
Noticed new bloody fluid coming out of left ear after turning patient. Notified Lovick MD on unit and Lovell Sheehan MD by phone, continue to monitor. Hypertonic order set missing 3% saline order, called to verify rate of 35ml/hr per Lovell Sheehan MD.
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
## 2019-04-29 LAB — HEPATIC FUNCTION PANEL
ALT: 31 U/L (ref 0–44)
AST: 67 U/L — ABNORMAL HIGH (ref 15–41)
Albumin: 2.4 g/dL — ABNORMAL LOW (ref 3.5–5.0)
Alkaline Phosphatase: 143 U/L — ABNORMAL HIGH (ref 38–126)
Bilirubin, Direct: 0.3 mg/dL — ABNORMAL HIGH (ref 0.0–0.2)
Indirect Bilirubin: 0.5 mg/dL (ref 0.3–0.9)
Total Bilirubin: 0.8 mg/dL (ref 0.3–1.2)
Total Protein: 5.6 g/dL — ABNORMAL LOW (ref 6.5–8.1)

## 2019-04-29 LAB — POCT I-STAT 7, (LYTES, BLD GAS, ICA,H+H)
Acid-base deficit: 1 mmol/L (ref 0.0–2.0)
Bicarbonate: 23.1 mmol/L (ref 20.0–28.0)
Calcium, Ion: 1.22 mmol/L (ref 1.15–1.40)
HCT: 25 % — ABNORMAL LOW (ref 36.0–46.0)
Hemoglobin: 8.5 g/dL — ABNORMAL LOW (ref 12.0–15.0)
O2 Saturation: 100 %
Patient temperature: 37.4
Potassium: 3.1 mmol/L — ABNORMAL LOW (ref 3.5–5.1)
Sodium: 147 mmol/L — ABNORMAL HIGH (ref 135–145)
TCO2: 24 mmol/L (ref 22–32)
pCO2 arterial: 37 mmHg (ref 32.0–48.0)
pH, Arterial: 7.405 (ref 7.350–7.450)
pO2, Arterial: 170 mmHg — ABNORMAL HIGH (ref 83.0–108.0)

## 2019-04-29 LAB — CBC
HCT: 26.9 % — ABNORMAL LOW (ref 36.0–46.0)
Hemoglobin: 8.9 g/dL — ABNORMAL LOW (ref 12.0–15.0)
MCH: 30.4 pg (ref 26.0–34.0)
MCHC: 33.1 g/dL (ref 30.0–36.0)
MCV: 91.8 fL (ref 80.0–100.0)
Platelets: 49 10*3/uL — ABNORMAL LOW (ref 150–400)
RBC: 2.93 MIL/uL — ABNORMAL LOW (ref 3.87–5.11)
RDW: 20.7 % — ABNORMAL HIGH (ref 11.5–15.5)
WBC: 11.8 10*3/uL — ABNORMAL HIGH (ref 4.0–10.5)
nRBC: 0 % (ref 0.0–0.2)

## 2019-04-29 LAB — MAGNESIUM: Magnesium: 2 mg/dL (ref 1.7–2.4)

## 2019-04-29 LAB — BASIC METABOLIC PANEL
Anion gap: 7 (ref 5–15)
BUN: 5 mg/dL — ABNORMAL LOW (ref 6–20)
CO2: 22 mmol/L (ref 22–32)
Calcium: 7.7 mg/dL — ABNORMAL LOW (ref 8.9–10.3)
Chloride: 117 mmol/L — ABNORMAL HIGH (ref 98–111)
Creatinine, Ser: 0.51 mg/dL (ref 0.44–1.00)
GFR calc Af Amer: >60 mL/min (ref >60)
GFR calc non Af Amer: >60 mL/min (ref >60)
Glucose, Bld: 174 mg/dL — ABNORMAL HIGH (ref 70–99)
Potassium: 3 mmol/L — ABNORMAL LOW (ref 3.5–5.1)
Sodium: 146 mmol/L — ABNORMAL HIGH (ref 135–145)

## 2019-04-29 LAB — GLUCOSE, CAPILLARY
Glucose-Capillary: 165 mg/dL — ABNORMAL HIGH (ref 70–99)
Glucose-Capillary: 133 mg/dL — ABNORMAL HIGH (ref 70–99)
Glucose-Capillary: 127 mg/dL — ABNORMAL HIGH (ref 70–99)
Glucose-Capillary: 156 mg/dL — ABNORMAL HIGH (ref 70–99)
Glucose-Capillary: 127 mg/dL — ABNORMAL HIGH (ref 70–99)
Glucose-Capillary: 114 mg/dL — ABNORMAL HIGH (ref 70–99)

## 2019-04-29 LAB — PHOSPHORUS: Phosphorus: 1.3 mg/dL — ABNORMAL LOW (ref 2.5–4.6)

## 2019-04-29 MED ORDER — POTASSIUM PHOSPHATES 15 MMOLE/5ML IV SOLN
30.0000 mmol | Freq: Once | INTRAVENOUS | Status: AC
  Administered 2019-04-29: 30 mmol via INTRAVENOUS
  Filled 2019-04-29: qty 10

## 2019-04-29 MED ORDER — THIAMINE HCL 100 MG PO TABS
100.0000 mg | ORAL_TABLET | Freq: Every day | ORAL | Status: DC
  Administered 2019-04-29 – 2019-05-08 (×10): 100 mg
  Filled 2019-04-29 (×10): qty 1

## 2019-04-29 MED ORDER — FOLIC ACID 1 MG PO TABS
1.0000 mg | ORAL_TABLET | Freq: Every day | ORAL | Status: DC
  Administered 2019-04-29 – 2019-05-08 (×10): 1 mg
  Filled 2019-04-29 (×10): qty 1

## 2019-04-29 NOTE — Progress Notes (Signed)
Conversation held with patient's son (bedside), neighbor of patient's son (bedside), patient's DSS attorney Velora Mediate (via phone), patient's nurse (bedside), and [place] interpreter (via phone). Patient's son gives permission for communication regarding clinical condition with attorney for the duration of the patient's hospitalization. I provided a clinical update to the attorney, followed by a discussion of the patient's current status and options of palliative route vs trach/PEG. Family would like to move forward with trach/PEG, verbalizing understanding that she will need long term facility care, possibly permanently. Given the family history, DSS attorney expresses that she will begin the process to place the patient's underage children previously removed from the home back in the home with the patient's eldest son, as the expectation is that she will need facility care for the near future. If placement of the underage children with the eldest child occurs, the patient will no longer be able to return to her previous residence in the setting of clinical improvement, as the children were previously removed from her care. DSS attorney will also begin the process of appointing a guardian ad litem for the patient. Informed consent obtained from the patient's son for tracheostomy and PEG tube placement. Will plan to perform later this week.   Additional critical care time:  Diamantina Monks, MD General and Trauma Surgery [facility]
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
## 2019-04-29 NOTE — Progress Notes (Signed)
Pt not following commands/responding w/ [name] interpreter.

## 2019-04-29 NOTE — Progress Notes (Signed)
Update provided to son via phone using Nepalese interpreter services. It is clear there are issues with resources, as while he is on the phone with me, I can hear him communicating with someone who is refusing his appointment because he is 10 minutes late and the interpreter scheduled for his appointment has left. The person he is speaking to then came on the line and stated she is with the Department of Health and Human Services and that the patient's son is present on his mother's behalf to visit with his two underage siblings who are in [facility] custody. She reports that the family has an Child psychotherapist, Technical brewer. Discussion with the social worker reveals similar concerns regarding comprehension despite appropriate interpreter services provided. She states similar concerns have been raised during communication with the patient's sister. Social worker also states son is the eldest and she is unmarried, stating a cultural obligation to his mother. She also informs me that the patient has a Designer, jewellery, [employer]. Discussion with the attorney reveals the patient has "serious problems with alcohol abuse, which is why her children are in custody". She also reports the patient has repeatedly refused to go into treatment and there is a history of multiple 911 calls to the residence and missing persons reports. Attorney also confirms some concerns with comprehension, but also provides the perspective of the son possibly feigning ignorance in fear of downstream consequences, including not knowing what will happen to his underage siblings. Son requested to continue our conversation in person and states he will come into the hospital at 1900 tonight. He reports continued challenges with transportation, so I am hopeful he will be able to come this evening. When informing the attorney of this potential meeting, she offered to be available via phone to help navigate this complex situation. I consulted  palliative care earlier this morning and hopefully they will be able to provide some assistance. Social worker and attorney both agree to be contacted at the numbers listed below.  [facility] family Child psychotherapist (legal guardian for the patient's children): Barbie Haggis, [phone] DSS appointed attorney for the patient: Velora Mediate, [phone]  Diamantina Monks, MD General and Trauma Surgery [facility] Surgery

## 2019-04-29 NOTE — Anesthesia Postprocedure Evaluation (Signed)
Anesthesia Post Note  Patient: Susan Mcconnell  Procedure(Mcconnell) Performed: CRANIOTOMY HEMATOMA EVACUATION EPIDURAL, CLOSURE OF RIGHT SCALP LACERATION (Left Head)     Patient location during evaluation: SICU Anesthesia Type: General Level of consciousness: sedated Pain management: pain level controlled Vital Signs Assessment: post-procedure vital signs reviewed and stable Respiratory status: patient remains intubated per anesthesia plan Cardiovascular status: stable Postop Assessment: no apparent nausea or vomiting Anesthetic complications: no    Last Vitals:  Vitals:   04/29/19 0500 04/29/19 0728  BP: 111/78   Pulse: 83 90  Resp: 11   Temp: 37.4 C   SpO2: 100% 100%    Last Pain:  Vitals:   04/29/19 0400  TempSrc: Bladder                 Susan Mcconnell
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
## 2019-04-30 ENCOUNTER — Inpatient Hospital Stay: Payer: Self-pay

## 2019-04-30 ENCOUNTER — Inpatient Hospital Stay (HOSPITAL_COMMUNITY): Payer: Medicaid Other

## 2019-04-30 DIAGNOSIS — T1490XA Injury, unspecified, initial encounter: Secondary | ICD-10-CM

## 2019-04-30 DIAGNOSIS — S0193XA Puncture wound without foreign body of unspecified part of head, initial encounter: Secondary | ICD-10-CM

## 2019-04-30 DIAGNOSIS — Z515 Encounter for palliative care: Secondary | ICD-10-CM

## 2019-04-30 LAB — BASIC METABOLIC PANEL
Anion gap: 6 (ref 5–15)
BUN: 6 mg/dL (ref 6–20)
CO2: 23 mmol/L (ref 22–32)
Calcium: 7.7 mg/dL — ABNORMAL LOW (ref 8.9–10.3)
Chloride: 113 mmol/L — ABNORMAL HIGH (ref 98–111)
Creatinine, Ser: 0.56 mg/dL (ref 0.44–1.00)
GFR calc Af Amer: >60 mL/min (ref >60)
GFR calc non Af Amer: >60 mL/min (ref >60)
Glucose, Bld: 109 mg/dL — ABNORMAL HIGH (ref 70–99)
Potassium: 3 mmol/L — ABNORMAL LOW (ref 3.5–5.1)
Sodium: 142 mmol/L (ref 135–145)

## 2019-04-30 LAB — CBC
HCT: 27 % — ABNORMAL LOW (ref 36.0–46.0)
Hemoglobin: 8.8 g/dL — ABNORMAL LOW (ref 12.0–15.0)
MCH: 31.2 pg (ref 26.0–34.0)
MCHC: 32.6 g/dL (ref 30.0–36.0)
MCV: 95.7 fL (ref 80.0–100.0)
Platelets: 50 10*3/uL — ABNORMAL LOW (ref 150–400)
RBC: 2.82 MIL/uL — ABNORMAL LOW (ref 3.87–5.11)
RDW: 20.8 % — ABNORMAL HIGH (ref 11.5–15.5)
WBC: 8.9 10*3/uL (ref 4.0–10.5)
nRBC: 0 % (ref 0.0–0.2)

## 2019-04-30 LAB — POCT I-STAT 7, (LYTES, BLD GAS, ICA,H+H)
Bicarbonate: 25.2 mmol/L (ref 20.0–28.0)
Calcium, Ion: 1.2 mmol/L (ref 1.15–1.40)
HCT: 38 % (ref 36.0–46.0)
Hemoglobin: 12.9 g/dL (ref 12.0–15.0)
O2 Saturation: 99 %
Patient temperature: 98.6
Potassium: 3 mmol/L — ABNORMAL LOW (ref 3.5–5.1)
Sodium: 148 mmol/L — ABNORMAL HIGH (ref 135–145)
TCO2: 26 mmol/L (ref 22–32)
pCO2 arterial: 40.7 mmHg (ref 32.0–48.0)
pH, Arterial: 7.399 (ref 7.350–7.450)
pO2, Arterial: 119 mmHg — ABNORMAL HIGH (ref 83.0–108.0)

## 2019-04-30 LAB — PHOSPHORUS: Phosphorus: 2.3 mg/dL — ABNORMAL LOW (ref 2.5–4.6)

## 2019-04-30 LAB — MAGNESIUM: Magnesium: 1.8 mg/dL (ref 1.7–2.4)

## 2019-04-30 LAB — GLUCOSE, CAPILLARY
Glucose-Capillary: 97 mg/dL (ref 70–99)
Glucose-Capillary: 112 mg/dL — ABNORMAL HIGH (ref 70–99)
Glucose-Capillary: 130 mg/dL — ABNORMAL HIGH (ref 70–99)
Glucose-Capillary: 108 mg/dL — ABNORMAL HIGH (ref 70–99)
Glucose-Capillary: 88 mg/dL (ref 70–99)
Glucose-Capillary: 93 mg/dL (ref 70–99)

## 2019-04-30 MED ORDER — POTASSIUM CHLORIDE 20 MEQ/15ML (10%) PO SOLN
40.0000 meq | Freq: Two times a day (BID) | ORAL | Status: AC
  Administered 2019-04-30 (×2): 40 meq
  Filled 2019-04-30 (×2): qty 30

## 2019-04-30 MED ORDER — POTASSIUM PHOSPHATES 15 MMOLE/5ML IV SOLN
20.0000 mmol | Freq: Once | INTRAVENOUS | Status: AC
  Administered 2019-04-30: 20 mmol via INTRAVENOUS
  Filled 2019-04-30: qty 6.67

## 2019-04-30 NOTE — Progress Notes (Signed)
Pt unable to follow commands with [name] interpreter.
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
## 2019-04-30 NOTE — Addendum Note (Signed)
Addendum  created 04/30/19 0747 by Adair Laundry, CRNA   Order list changed

## 2019-04-30 NOTE — Progress Notes (Signed)
Subjective: The patient is sedated and intubated.  She was recently given Ativan for agitation.  Objective: Vital signs in last 24 hours: Temp:  [99.1 F (37.3 C)-100.6 F (38.1 C)] 99.3 F (37.4 C) (03/31 0400) Pulse Rate:  [74-101] 77 (03/31 0400) Resp:  [14-18] 16 (03/31 0400) BP: (100-128)/(68-87) 103/70 (03/31 0400) SpO2:  [96 %-100 %] 100 % (03/31 0736) Arterial Line BP: (118-139)/(61-75) 122/63 (03/31 0400) FiO2 (%):  [30 %-40 %] 30 % (03/31 0736) Weight:  [44.9 kg] 44.9 kg (03/31 0337) Estimated body mass index is 19.33 kg/m as calculated from the following:   Height as of this encounter: 5' (1.524 m).   Weight as of this encounter: 44.9 kg.   Intake/Output from previous day: 03/30 0701 - 03/31 0700 In: 2563.1 [I.V.:1048.1; NG/GT:805; IV Piggyback:710] Out: 1880 [Urine:1880] Intake/Output this shift: No intake/output data recorded.  Physical exam  scale coma scale 8, intubated and sedated, [id number].  Her pupils are equal.  She localizes on the left.  Lab Results: Recent Labs    04/29/19 0210 04/29/19 0354 04/30/19 0327 04/30/19 0426  WBC 11.8*  --  8.9  --   HGB 8.9*   < > 8.8* 12.9  HCT 26.9*   < > 27.0* 38.0  PLT 49*  --  50*  --    < > = values in this interval not displayed.   BMET Recent Labs    04/29/19 0210 04/29/19 0354 04/30/19 0327 04/30/19 0426  NA 146*   < > 142 148*  K 3.0*   < > 3.0* 3.0*  CL 117*  --  113*  --   CO2 22  --  23  --   GLUCOSE 174*  --  109*  --   BUN 5*  --  6  --   CREATININE 0.51  --  0.56  --   CALCIUM 7.7*  --  7.7*  --    < > = values in this interval not displayed.    Studies/Results: DG Chest Port 1 View  Result Date: 04/30/2019 CLINICAL DATA:  Respiratory failure EXAM: PORTABLE CHEST 1 VIEW COMPARISON:  Two days ago FINDINGS: The endotracheal tube tip is 1 cm above the carina. The feeding tube at least reaches the distal stomach. Mild atelectasis at the right base since prior. There is no edema,  consolidation, effusion, or pneumothorax. Normal heart size. IMPRESSION: 1. Endotracheal tube tip is 1 cm above the carina. 2. Interval mild atelectatic opacity at the right base. Electronically Signed   By: Marnee Spring M.D.   On: 04/30/2019 06:51   DG CHEST PORT 1 VIEW  Result Date: 04/28/2019 CLINICAL DATA:  Sudden oxygen desaturation today. EXAM: PORTABLE CHEST 1 VIEW COMPARISON:  04/28/2019 FINDINGS: The endotracheal tube is in good position 3.4 cm above the carina. New. Heart size and pulmonary vascularity are normal. The lungs are clear. No bone abnormality. No feeding tube tip is below the diaphragm IMPRESSION: No acute abnormality. Endotracheal tube in good position. Electronically Signed   By: Francene Boyers M.D.   On: 04/28/2019 11:10    Assessment/Plan: Postop day #3: The patient is clinically stable.  We will continue clinical observation as she has an exam to follow and has significant left frontal and temporal contusions.  Thrombocytopenia: Noted  Alcohol abuse: We are observing for withdrawal which would certainly complicate her clinical exam.  LOS: 3 days     Cristi Loron 04/30/2019, 8:11 AM

## 2019-04-30 NOTE — Consult Note (Signed)
Consultation Note Date: 04/30/2019   Patient Name: Susan Mcconnell  DOB: [birth year]  MRN: [id number]  Age / Sex: 44 y.o., female  PCP: Patient, No Pcp Per Referring Physician: Md, Trauma, MD  Reason for Consultation: Establishing goals of care and Psychosocial/spiritual support  HPI/Patient Profile: 44 y.o. female   admitted on 04/16/2019 after having been found by her son unconscious.  She was brought to [facility] and intubated head scan demonstrated that the patient had a large left upper dural hematoma as well as bifrontal contusions, and traumatic subarachnoid hemorrhage. Patient is status post craniotomy with evacuation of the epidural hematoma.  Patient remains ventilated.  Patient's situation is with complex social issues including DSS and DSS appointed attorney.  I discussed with Dr. Grandville Silos the requested palliative medicine consult.   Hope is for PMT to emotionally support family, offer education and help clarify goals of care.    Clinical Assessment and Goals of Care:  This nurse practitioner reviewed medical records, received report from team assessed the patient and then proceeded forward with conversation with court appointed attorney Lanna Poche [phone].  Ms. Haig Prophet shares with me her year involvement with this patient and her family.  Patient's situation was psychosocially complicated prior to this medical situation.    Ms Marlene Lard her concern that in spite of providers best efforts, and the utilization of interpreter,  she worries that patient's son Aella Ronda does not have an understanding of the current situation, and likel;y future outcomes.     Oneta Rack is hoping to involve a [name] refugee support person, and include him in future conversations with medical team and son.. There is also conversation around securing guardianship for the patient with the support of  DSS and DSS attorney.  I strongly recommend that we have a meeting in person with son, support persons, and attorney before moving forward with Trach and PEG.    Attorney/Shanice Orange tells me she is working on this case today and will get back to me at her earliest convenience hoping to schedule an in person meeting with all pertinent parties ensuring clear communication, delivery of information in hopes of establishing a plan of care in the patient's best interest..  I will hold off on further communication with family and await callback from attorney.      Primary Diagnoses: Present on Admission: . Epidural hematoma ([place]) . Traumatic epidural hematoma with loss of consciousness greater than 24 hours without return to pre-existing conscious level with patient surviving [facility])   I have reviewed the medical record, interviewed the patient and family, and examined the patient. The following aspects are pertinent.  History reviewed. No pertinent past medical history. Social History   Socioeconomic History  . Marital status: Divorced    Spouse name: Not on file  . Number of children: Not on file  . Years of education: Not on file  . Highest education level: Not on file  Occupational History  . Not on file  Tobacco Use  . Smoking status: Not  on file  Substance and Sexual Activity  . Alcohol use: Not on file  . Drug use: Not on file  . Sexual activity: Not on file  Other Topics Concern  . Not on file  Social History Narrative  . Not on file   Social Determinants of Health   Financial Resource Strain:   . Difficulty of Paying Living Expenses:   Food Insecurity:   . Worried About Charity fundraiser in the Last Year:   . Arboriculturist in the Last Year:   Transportation Needs:   . Film/video editor (Medical):   Marland Kitchen Lack of Transportation (Non-Medical):   Physical Activity:   . Days of Exercise per Week:   . Minutes of Exercise per Session:   Stress:   . Feeling of  Stress :   Social Connections:   . Frequency of Communication with Friends and Family:   . Frequency of Social Gatherings with Friends and Family:   . Attends Religious Services:   . Active Member of Clubs or Organizations:   . Attends Archivist Meetings:   Marland Kitchen Marital Status:    History reviewed. No pertinent family history. Scheduled Meds: . acetaminophen (TYLENOL) oral liquid 160 mg/5 mL  650 mg Per Tube Q6H  . chlorhexidine gluconate (MEDLINE KIT)  15 mL Mouth Rinse BID  . Chlorhexidine Gluconate Cloth  6 each Topical Q0600  . docusate  100 mg Per Tube BID  . folic acid  1 mg Per Tube Daily  . mouth rinse  15 mL Mouth Rinse 10 times per day  . pantoprazole  40 mg Oral Daily   Or  . pantoprazole (PROTONIX) IV  40 mg Intravenous Daily  . potassium chloride  40 mEq Per Tube BID  . thiamine  100 mg Per Tube Daily   Continuous Infusions: . sodium chloride 40 mL/hr at 04/30/19 0600  . feeding supplement (PIVOT 1.5 CAL) 1,000 mL (04/29/19 1345)  . fentaNYL infusion INTRAVENOUS 50 mcg/hr (04/29/19 1733)  . levETIRAcetam Stopped (04/29/19 2347)  . potassium PHOSPHATE IVPB (in mmol)     PRN Meds:.fentaNYL, fentaNYL (SUBLIMAZE) injection, labetalol, LORazepam, midazolam, ondansetron **OR** ondansetron (ZOFRAN) IV, oxyCODONE Medications Prior to Admission:  Prior to Admission medications   Medication Sig Start Date End Date Taking? Authorizing Provider  naproxen (NAPROSYN) 500 MG tablet Take 500 mg by mouth 2 (two) times daily. 11/27/18   [provider]   No Known Allergies Review of Systems  Unable to perform ROS: Acuity of condition    Physical Exam Constitutional:      Interventions: She is intubated.  Cardiovascular:     Rate and Rhythm: Tachycardia present.  Pulmonary:     Effort: She is intubated.  Skin:    General: Skin is warm and dry.     Vital Signs: BP 100/64   Pulse (!) 102   Temp 99.7 F (37.6 C)   Resp 11   Ht 5' (1.524 m)   Wt 44.9  kg   SpO2 100%   BMI 19.33 kg/m  Pain Scale: CPOT       SpO2: SpO2: 100 % O2 Device:SpO2: 100 % O2 Flow Rate: .   IO: Intake/output summary:   Intake/Output Summary (Last 24 hours) at 04/30/2019 0932 Last data filed at 04/30/2019 0600 Gross per 24 hour  Intake 2394.75 ml  Output 1680 ml  Net 714.75 ml    LBM: Last BM Date: (PTA) Baseline Weight: Weight: 41.7 kg Most recent  weight: Weight: 44.9 kg     Palliative Assessment/Data:   Discussed with Dr Grandville Silos and Margaretha Sheffield RN  Time In: 0830 Time Out: 0940 Time Total: 70 minutes Greater than 50%  of this time was spent counseling and coordinating care related to the above assessment and plan.  Signed by: Wadie Lessen, NP   Please contact Palliative Medicine Team phone at [phone] for questions and concerns.  For individual provider: See Shea Evans

## 2019-05-01 ENCOUNTER — Inpatient Hospital Stay (HOSPITAL_COMMUNITY): Payer: Medicaid Other

## 2019-05-01 DIAGNOSIS — J9610 Chronic respiratory failure, unspecified whether with hypoxia or hypercapnia: Secondary | ICD-10-CM

## 2019-05-01 LAB — GLUCOSE, CAPILLARY
Glucose-Capillary: 108 mg/dL — ABNORMAL HIGH (ref 70–99)
Glucose-Capillary: 120 mg/dL — ABNORMAL HIGH (ref 70–99)
Glucose-Capillary: 128 mg/dL — ABNORMAL HIGH (ref 70–99)
Glucose-Capillary: 106 mg/dL — ABNORMAL HIGH (ref 70–99)
Glucose-Capillary: 126 mg/dL — ABNORMAL HIGH (ref 70–99)
Glucose-Capillary: 124 mg/dL — ABNORMAL HIGH (ref 70–99)

## 2019-05-01 LAB — TYPE AND SCREEN
ABO/RH(D): O POS
Antibody Screen: NEGATIVE
Unit division: 0
Unit division: 0
Unit division: 0
Unit division: 0

## 2019-05-01 LAB — BPAM RBC
Blood Product Expiration Date: 202104262359
Blood Product Expiration Date: 202104262359
Blood Product Expiration Date: 202105012359
Blood Product Expiration Date: 202105012359
ISSUE DATE / TIME: 202103271425
ISSUE DATE / TIME: 202103280902
ISSUE DATE / TIME: 202103280902
Unit Type and Rh: 5100
Unit Type and Rh: 5100
Unit Type and Rh: 5100
Unit Type and Rh: 5100

## 2019-05-01 LAB — CBC
HCT: 28.8 % — ABNORMAL LOW (ref 36.0–46.0)
Hemoglobin: 9 g/dL — ABNORMAL LOW (ref 12.0–15.0)
MCH: 30.5 pg (ref 26.0–34.0)
MCHC: 31.3 g/dL (ref 30.0–36.0)
MCV: 97.6 fL (ref 80.0–100.0)
Platelets: 77 10*3/uL — ABNORMAL LOW (ref 150–400)
RBC: 2.95 MIL/uL — ABNORMAL LOW (ref 3.87–5.11)
RDW: 20.6 % — ABNORMAL HIGH (ref 11.5–15.5)
WBC: 8.9 10*3/uL (ref 4.0–10.5)
nRBC: 0 % (ref 0.0–0.2)

## 2019-05-01 LAB — BASIC METABOLIC PANEL
Anion gap: 8 (ref 5–15)
BUN: 7 mg/dL (ref 6–20)
CO2: 24 mmol/L (ref 22–32)
Calcium: 8.1 mg/dL — ABNORMAL LOW (ref 8.9–10.3)
Chloride: 112 mmol/L — ABNORMAL HIGH (ref 98–111)
Creatinine, Ser: 0.49 mg/dL (ref 0.44–1.00)
GFR calc Af Amer: >60 mL/min (ref >60)
GFR calc non Af Amer: >60 mL/min (ref >60)
Glucose, Bld: 120 mg/dL — ABNORMAL HIGH (ref 70–99)
Potassium: 4.3 mmol/L (ref 3.5–5.1)
Sodium: 144 mmol/L (ref 135–145)

## 2019-05-01 LAB — HEPATIC FUNCTION PANEL
ALT: 34 U/L (ref 0–44)
AST: 86 U/L — ABNORMAL HIGH (ref 15–41)
Albumin: 2 g/dL — ABNORMAL LOW (ref 3.5–5.0)
Alkaline Phosphatase: 168 U/L — ABNORMAL HIGH (ref 38–126)
Bilirubin, Direct: 0.3 mg/dL — ABNORMAL HIGH (ref 0.0–0.2)
Indirect Bilirubin: 0.4 mg/dL (ref 0.3–0.9)
Total Bilirubin: 0.7 mg/dL (ref 0.3–1.2)
Total Protein: 5.6 g/dL — ABNORMAL LOW (ref 6.5–8.1)

## 2019-05-01 MED ORDER — PIVOT 1.5 CAL PO LIQD
1000.0000 mL | ORAL | Status: DC
  Administered 2019-05-01 – 2019-05-07 (×5): 1000 mL
  Filled 2019-05-01: qty 1000

## 2019-05-01 MED ORDER — BETHANECHOL CHLORIDE 10 MG PO TABS
25.0000 mg | ORAL_TABLET | Freq: Three times a day (TID) | ORAL | Status: DC
  Administered 2019-05-01 – 2019-05-08 (×22): 25 mg
  Filled 2019-05-01 (×22): qty 3

## 2019-05-01 NOTE — Progress Notes (Addendum)
Patient ID: Susan Mcconnell, female   DOB: 04/28/1975, 44 y.o.   MRN: [id number] Follow up - Trauma Critical Care  Patient Details:    Susan Mcconnell is an 44 y.o. female.  Lines/tubes : Airway (Active)  Secured at (cm) 23 cm 05/01/19 0800  Measured From Lips 05/01/19 0800  Secured Location Left 05/01/19 0800  Secured By [employer] 05/01/19 0800  Tube Holder Repositioned Yes 05/01/19 0800  Cuff Pressure (cm H2O) 28 cm H2O 05/01/19 0800  Site Condition Dry 05/01/19 0800     Arterial Line 01-May-2019 Right Radial (Active)  Site Assessment Clean;Dry;Intact 05/01/19 0800  Line Status Pulsatile blood flow 05/01/19 0800  Art Line Waveform Appropriate 05/01/19 0800  Art Line Interventions Zeroed and calibrated 05/01/19 0800  Color/Movement/Sensation Capillary refill less than 3 sec 05/01/19 0800  Dressing Type Transparent 05/01/19 0800  Dressing Status Clean;Dry;Intact 05/01/19 0800  Dressing Change Due 05/04/19 05/01/19 0800     External Urinary Catheter (Active)  Collection Container Dedicated Suction Canister 05/01/19 0800  Securement Method Securing device (Describe) 05/01/19 0800  Site Assessment Clean;Intact;Dry 05/01/19 0800  Intervention Equipment Changed 04/30/19 2000    Microbiology/Sepsis markers: Results for orders placed or performed during the hospital encounter of 01-May-2019  Respiratory Panel by RT PCR (Flu A&B, Covid) - Nasopharyngeal Swab     Status: None   Collection Time: 05-01-19  6:01 AM   Specimen: Nasopharyngeal Swab  Result Value Ref Range Status   SARS Coronavirus 2 by RT PCR NEGATIVE NEGATIVE Final    Comment: (NOTE) SARS-CoV-2 target nucleic acids are NOT DETECTED. The SARS-CoV-2 RNA is generally detectable in upper respiratoy specimens during the acute phase of infection. The lowest concentration of SARS-CoV-2 viral copies this assay can detect is 131 copies/mL. A negative result does not preclude SARS-Cov-2 infection and should not be used as the  sole basis for treatment or other patient management decisions. A negative result may occur with  improper specimen collection/handling, submission of specimen other than nasopharyngeal swab, presence of viral mutation(s) within the areas targeted by this assay, and inadequate number of viral copies (<131 copies/mL). A negative result must be combined with clinical observations, patient history, and epidemiological information. The expected result is Negative. Fact Sheet for Patients:  https://www.moore.com/ Fact Sheet for Healthcare Providers:  https://www.young.biz/ This test is not yet ap proved or cleared by the [place] FDA and  has been authorized for detection and/or diagnosis of SARS-CoV-2 by FDA under an Emergency Use Authorization (EUA). This EUA will remain  in effect (meaning this test can be used) for the duration of the COVID-19 declaration under Section 564(b)(1) of the Act, 21 U.S.C. section 360bbb-3(b)(1), unless the authorization is terminated or revoked sooner.    Influenza A by PCR NEGATIVE NEGATIVE Final   Influenza B by PCR NEGATIVE NEGATIVE Final    Comment: (NOTE) The Xpert Xpress SARS-CoV-2/FLU/RSV assay is intended as an aid in  the diagnosis of influenza from Nasopharyngeal swab specimens and  should not be used as a sole basis for treatment. Nasal washings and  aspirates are unacceptable for Xpert Xpress SARS-CoV-2/FLU/RSV  testing. Fact Sheet for Patients: https://www.moore.com/ Fact Sheet for Healthcare Providers: https://www.young.biz/ This test is not yet approved or cleared by the [place] FDA and  has been authorized for detection and/or diagnosis of SARS-CoV-2 by  FDA under an Emergency Use Authorization (EUA). This EUA will remain  in effect (meaning this test can be used) for the duration of the  Covid-19 declaration  under Section 564(b)(1) of the Act, 21    U.S.C. section 360bbb-3(b)(1), unless the authorization is  terminated or revoked. Performed at [facility], [place] [address], [place], [place] [address]   MRSA PCR Screening     Status: None   Collection Time: 04/10/2019 11:03 AM   Specimen: Nasopharyngeal  Result Value Ref Range Status   MRSA by PCR NEGATIVE NEGATIVE Final    Comment:        The GeneXpert MRSA Assay (FDA approved for NASAL specimens only), is one component of a comprehensive MRSA colonization surveillance program. It is not intended to diagnose MRSA infection nor to guide or monitor treatment for MRSA infections. Performed at [facility], [place] [address] [place], [place] [address]     Anti-infectives:  Anti-infectives (From admission, onward)   Start     Dose/Rate Route Frequency Ordered Stop   04/28/2019 1400  ceFAZolin (ANCEF) IVPB 1 g/50 mL premix     1 g 100 mL/hr over 30 Minutes Intravenous Every 8 hours 03/31/2019 1252 04/28/19 0651   04/17/2019 0916  bacitracin 50,000 Units in sodium chloride 0.9 % 500 mL irrigation  Status:  Discontinued       As needed 04/17/2019 0916 04/23/2019 1044      Best Practice/Protocols:  VTE Prophylaxis: Mechanical Continous Sedation  Consults: Treatment Team:  Newman Pies, MD    Studies:    Events:  Subjective:    Overnight Issues:   Objective:  Vital signs for last 24 hours: Temp:  [98.4 F (36.9 C)-100.5 F (38.1 C)] 100.5 F (38.1 C) (04/01 0800) Pulse Rate:  [84-102] 96 (04/01 0800) Resp:  [9-16] 16 (04/01 0800) BP: (92-131)/(57-78) 96/59 (04/01 0800) SpO2:  [100 %] 100 % (04/01 0800) Arterial Line BP: (109-134)/(52-69) 129/55 (04/01 0800) FiO2 (%):  [30 %] 30 % (04/01 0751) Weight:  [49 kg] 49 kg (04/01 0225)  Hemodynamic parameters for last 24 hours:    Intake/Output from previous day: 03/31 0701 - 04/01 0700 In: 2838.5 [I.V.:1256.5; NG/GT:875; IV Piggyback:707] Out: 2200 [Urine:2200]  Intake/Output this shift: No  intake/output data recorded.  Vent settings for last 24 hours: Vent Mode: PSV;CPAP FiO2 (%):  [30 %] 30 % Set Rate:  [16 bmp] 16 bmp Vt Set:  [370 mL] 370 mL PEEP:  [5 cmH20] 5 cmH20 Pressure Support:  [8 cmH20] 8 cmH20 Plateau Pressure:  [13 cmH20-18 cmH20] 18 cmH20  Physical Exam:  General: on vent Neuro: pupils 45mm, localizes with LUE, no other movement HEENT/Neck: ETT and scalp incision Resp: clear to auscultation bilaterally CVS: RRR GI: soft, NT Extremities: no edema  Results for orders placed or performed during the hospital encounter of 04/12/2019 (from the past 24 hour(s))  Glucose, capillary     Status: Abnormal   Collection Time: 04/30/19 11:31 AM  Result Value Ref Range   Glucose-Capillary 130 (H) 70 - 99 mg/dL   Comment 1 Notify RN    Comment 2 Document in Chart   Glucose, capillary     Status: Abnormal   Collection Time: 04/30/19  4:04 PM  Result Value Ref Range   Glucose-Capillary 108 (H) 70 - 99 mg/dL   Comment 1 Notify RN    Comment 2 Document in Chart   Glucose, capillary     Status: None   Collection Time: 04/30/19  7:53 PM  Result Value Ref Range   Glucose-Capillary 88 70 - 99 mg/dL  Glucose, capillary     Status: None  Collection Time: 04/30/19 11:29 PM  Result Value Ref Range   Glucose-Capillary 93 70 - 99 mg/dL  Glucose, capillary     Status: Abnormal   Collection Time: 05/01/19  3:35 AM  Result Value Ref Range   Glucose-Capillary 108 (H) 70 - 99 mg/dL  Hepatic function panel     Status: Abnormal   Collection Time: 05/01/19  4:44 AM  Result Value Ref Range   Total Protein 5.6 (L) 6.5 - 8.1 g/dL   Albumin 2.0 (L) 3.5 - 5.0 g/dL   AST 86 (H) 15 - 41 U/L   ALT 34 0 - 44 U/L   Alkaline Phosphatase 168 (H) 38 - 126 U/L   Total Bilirubin 0.7 0.3 - 1.2 mg/dL   Bilirubin, Direct 0.3 (H) 0.0 - 0.2 mg/dL   Indirect Bilirubin 0.4 0.3 - 0.9 mg/dL  CBC     Status: Abnormal   Collection Time: 05/01/19  4:44 AM  Result Value Ref Range   WBC 8.9 4.0 -  10.5 K/uL   RBC 2.95 (L) 3.87 - 5.11 MIL/uL   Hemoglobin 9.0 (L) 12.0 - 15.0 g/dL   HCT 81.1 (L) 91.4 - 78.2 %   MCV 97.6 80.0 - 100.0 fL   MCH 30.5 26.0 - 34.0 pg   MCHC 31.3 30.0 - 36.0 g/dL   RDW 95.6 (H) 21.3 - 08.6 %   Platelets 77 (L) 150 - 400 K/uL   nRBC 0.0 0.0 - 0.2 %  Basic metabolic panel     Status: Abnormal   Collection Time: 05/01/19  4:44 AM  Result Value Ref Range   Sodium 144 135 - 145 mmol/L   Potassium 4.3 3.5 - 5.1 mmol/L   Chloride 112 (H) 98 - 111 mmol/L   CO2 24 22 - 32 mmol/L   Glucose, Bld 120 (H) 70 - 99 mg/dL   BUN 7 6 - 20 mg/dL   Creatinine, Ser 5.78 0.44 - 1.00 mg/dL   Calcium 8.1 (L) 8.9 - 10.3 mg/dL   GFR calc non Af Amer >60 >60 mL/min   GFR calc Af Amer >60 >60 mL/min   Anion gap 8 5 - 15  Glucose, capillary     Status: Abnormal   Collection Time: 05/01/19  8:24 AM  Result Value Ref Range   Glucose-Capillary 120 (H) 70 - 99 mg/dL    Assessment & Plan: Present on Admission: . Epidural hematoma ([facility]) . Traumatic epidural hematoma with loss of consciousness greater than 24 hours without return to pre-existing conscious level with patient surviving ([facility])    LOS: 4 days   Additional comments:I reviewed the patient's new clinical lab test results. CT head pending Fall/ETOH  L EDH with shift, bifrontal and bitemporal hemorrhagic contusions - NSGY c/s (D/r Lovell Sheehan), s/p emergent L craniotomy 3/28. Localizes on L, F/U CT head today. VDRF - may wean but not extubate at this point EtOH abuse - CIWA Transaminitis, thrombocytopenia - presumably from chronic liver dysfunction 2/2 alcohol abuse, continue to monitor. PM-TEG showed good PLT function. PLTs up to 77k,  FEN - cortrak, TF, K is corrected DVT - SCDs, hold LMWH Dispo -  ICU, Palliative consult appreciated and issues noted. Agree to hold off on trach/PEG until meeting with her son and DSS appointed attorney  Patient will be disabled for greater than I year. Critical Care Total Time*: 40  Minutes  Violeta Gelinas, MD, MPH, FACS Trauma & General Surgery Use [email or web address] to contact on call provider  05/01/2019  *Care  during the described time interval was provided by me. I have reviewed this patient's available data, including medical history, events of note, physical examination and test results as part of my evaluation.

## 2019-05-01 NOTE — Op Note (Signed)
Brief history: The patient is a 44 year old immigrant from [place] who was found unconscious and intoxicated on 04/26/2019.  She was intubated and found to have cerebral contusions and a large epidural hematoma.  I performed a craniotomy to evacuate the hematoma.  The patient has had a clot in her neurologic status.  A follow-up head CT demonstrated the patient had increased left frontal and temporal contusions and swelling with some mild hydrocephalus.  We discussed this with the patient's son and told him prognosis was poor but I suggested we try to place a ventriculostomy.  We explained the procedure, risks, benefits and alternatives.  We answered all his questions.  He has consented on behalf of his mother.  Preop diagnosis: Left epidural hematoma, left temporal contusions, left frontal contusions, hydrocephalus  Postop diagnosis: Same  Procedure: Placement of right frontal ventriculostomy via bur hole  Surgeon: Dr. Delma Officer  Assistant: None  Anesthesia: Local  Estimated blood loss: Minimal  Specimens: None  Complications: None  Description of procedure: After an appropriate timeout the patient's right frontal scalp was then shaved with clippers and prepared with DuraPrep.  Sterile drapes were applied.  I injected the area to be incised with lidocaine with epinephrine solution.  I did a scalpel to make a incision in the patient's mid pupillary line on the right.  We used the self-retaining retractor for exposure.  I used the "eggbeater" drill to create a right frontal burr hole.  I incised the underlying dura with the scalpel.  I cannulated the patient's ventricular system with a ventriculostomy.  I tunneled the ventriculostomy under the scalp and connected it to the drainage system.  I secured the ventriculostomy with 3-0 nylon sutures.  I then reapproximated patient's scalp incision with a running 3-0 nylon suture.  A sterile drape was applied.  The drapes were removed.

## 2019-05-01 NOTE — Progress Notes (Signed)
Subjective: The patient is sedated with fentanyl.  She is intubated and in no apparent distress.  Objective: Vital signs in last 24 hours: Temp:  [98.4 F (36.9 C)-100.5 F (38.1 C)] 100.5 F (38.1 C) (04/01 0800) Pulse Rate:  [84-102] 95 (04/01 0900) Resp:  [12-16] 16 (04/01 1000) BP: (92-131)/(57-78) 109/71 (04/01 1000) SpO2:  [100 %] 100 % (04/01 0900) Arterial Line BP: (109-134)/(49-69) 128/55 (04/01 1000) FiO2 (%):  [30 %] 30 % (04/01 0751) Weight:  [49 kg] 49 kg (04/01 0225) Estimated body mass index is 21.1 kg/m as calculated from the following:   Height as of this encounter: 5' (1.524 m).   Weight as of this encounter: 49 kg.   Intake/Output from previous day: 03/31 0701 - 04/01 0700 In: 2838.5 [I.V.:1256.5; NG/GT:875; IV Piggyback:707] Out: 2200 [Urine:2200] Intake/Output this shift: Total I/O In: 237.5 [I.V.:132.5; NG/GT:105] Out: 750 [Urine:750]  Physical exam Glascow coma scale, 7 intubated, [id number].  The patient's pupils are approximately 3 mm bilaterally.  She will localize with her left upper extremity.  She does not move the other extremities.  I have reviewed the patient's follow-up head CT performed at [facility] today.  She has progressive swelling around her left frontal and temporal contusions.  There is increased midline shift.  She has mild hydrocephalus.  Lab Results: Recent Labs    04/30/19 0327 04/30/19 0327 04/30/19 0426 05/01/19 0444  WBC 8.9  --   --  8.9  HGB 8.8*   < > 12.9 9.0*  HCT 27.0*   < > 38.0 28.8*  PLT 50*  --   --  77*   < > = values in this interval not displayed.   BMET Recent Labs    04/30/19 0327 04/30/19 0327 04/30/19 0426 05/01/19 0444  NA 142   < > 148* 144  K 3.0*   < > 3.0* 4.3  CL 113*  --   --  112*  CO2 23  --   --  24  GLUCOSE 109*  --   --  120*  BUN 6  --   --  7  CREATININE 0.56  --   --  0.49  CALCIUM 7.7*  --   --  8.1*   < > = values in this interval not displayed.     Studies/Results: DG Chest Port 1 View  Result Date: 04/30/2019 CLINICAL DATA:  Respiratory failure EXAM: PORTABLE CHEST 1 VIEW COMPARISON:  Two days ago FINDINGS: The endotracheal tube tip is 1 cm above the carina. The feeding tube at least reaches the distal stomach. Mild atelectasis at the right base since prior. There is no edema, consolidation, effusion, or pneumothorax. Normal heart size. IMPRESSION: 1. Endotracheal tube tip is 1 cm above the carina. 2. Interval mild atelectatic opacity at the right base. Electronically Signed   By: Marnee Spring M.D.   On: 04/30/2019 06:51   [place] EKG SITE RITE  Result Date: 04/30/2019 If Site Rite image not attached, placement could not be confirmed due to current cardiac rhythm.   Assessment/Plan: Traumatic brain injury, cerebral contusions, epidural hematoma, hydrocephalus: The patient is a bit worse neurologically and her CAT scan looks worse.  I do not think she would benefit from an evacuation of her dominant frontal and temporal lobes as this would almost certainly leave her aphasic.  The prognosis is poor at this point.  I think the only thing we can do is place a ventriculostomy and hope that she improves.  We will discuss placement of a right frontal ventriculostomy with the patient's son via the translator.  The risk include hemorrhage, malplacement, malfunction, etc.  She is at increased risk for hemorrhage given her thrombocytopenia.  The alternative is to continue with medical management but she seems to be getting worse.  LOS: 4 days     Ophelia Charter 05/01/2019, 11:37 AM

## 2019-05-01 NOTE — Clinical Social Work Note (Signed)
Meeting held with [facility] APS Social Worker Susan Mcconnell [phone], Susan Mcconnell, [name] translator, Susan Mcconnell, APS supervisor(remote), Neuro NP Susan Smoke, NP Palliative to discuss patient's decision maker.    Patient's son Susan Mcconnell was present via telephone. Translator was able to provide education regarding procedure that was going to take place today as well as have Susan Mcconnell teach back what he understands to be happening with his mother.   From conversation with translator, Susan Mcconnell appears to have clear decision making capacity and is able to teach back the information in an appropriate way.   Susan Mcconnell would like to move forward with trach and PEG.   Susan Mcconnell is the patient's decision maker, and fully understands the situation. Susan Mcconnell is NOT the patient's guardian and is not responsible for making her decisions at this time. APS is following this case.   Several other social issues, however DSS is following and will provide the appropriate assistance as needed.   TOC will continue to follow.

## 2019-05-01 NOTE — Progress Notes (Signed)
Patient ID: Cyera Balboni, female   DOB: 08-03-1975, 44 y.o.   MRN: [id number]  This NP visited patient at the bedside as a follow up for palliative medicine needs and emotional support.  This NP was notified that [facility] and [employer] was on the unit with an interpreter with questions regarding this patient and current medical situation.  Persons present Myrtis Ser, Laban Emperor , APS supervisor (phone) Arti/ [name] interpreter, Shenita Wicker/patient's clinical social worker.    Main concern was discussion regarding patient's decision maker.  Patient's son Eleesha Purkey was contacted and was present via telephone.  Medical team/neuro NP Aundra Millet was able to offer information and secure consent for ventriculostomy.  With the help of the interpreter I was able to have conversation with son regarding patient's current medical situation. Created space and opportunity for son to explore his thoughts and feelings regarding current medical situation. Understandably he was emotional at times.  I was able to communicate with him the seriousness of his mother's current medical situation and that I worry that  anything could happen at any time.  He understands.  I do believe the patient is the appropriate decision maker and understands the situation as well as any layperson/ 44yo.  He will benefit from ongoing support and education.  He was able to verbalize that he does not have medical experience and is totally looking to the medical providers to make the best recommendations for his mother. He clearly is looking for guidance from the medical team regarding difficult decisions.  Questions and concerns addressed   Emotional support offered  PMT will continue to support holistically and this nurse practitioner will secure in person interpreter again on  Monday for ongoing conversation with son.  Total time spent on the unit was 60 minutes  Greater than 50% of the time was spent in  counseling and coordination of care  Lorinda Creed NP  Palliative Medicine Team Team Phone # [phone] Pager [phone]

## 2019-05-01 NOTE — Progress Notes (Signed)
Nutrition Follow-up  DOCUMENTATION CODES:   Not applicable  INTERVENTION:   Increase Pivot 1.5 to 40 ml/hr (960 ml/day) via Cortrak tube  Provides: 1440 kcal, 90 grams protein, and 728 ml free water.    NUTRITION DIAGNOSIS:   Increased nutrient needs related to post-op healing as evidenced by estimated needs. Ongoing.   GOAL:   Patient will meet greater than or equal to 90% of their needs Meeting with TF  MONITOR:   TF tolerance, Vent status  REASON FOR ASSESSMENT:   Consult, Ventilator Enteral/tube feeding initiation and management  ASSESSMENT:   Pt admitted with left epidural hematoma, skull fracture, bifrontal contusions, subdural hematoma, traumatic subarachnoid hemorrhage, complex right parietal scalp laceration s/p emergent L crani and repair of scalp laceration. Positive ETOH on admission.   Pt discussed during ICU rounds and with RN.  MD working with son, DSS, and palliative care for comfort vs trach/PEG.   Patient is currently intubated on ventilator support MV: 5.9 L/min Temp (24hrs), Avg:99.1 F (37.3 C), Min:98.4 F (36.9 C), Max:100.5 F (38.1 C)  Medications reviewed and include: colace, folic acid, thiamine  Labs reviewed   TF: Pivot 1.5 @ 35 ml/hr; provides: 1260 kcal, 78 grams protein  Diet Order:   Diet Order            Diet NPO time specified  Diet effective now              EDUCATION NEEDS:   No education needs have been identified at this time  Skin:  Skin Assessment: Reviewed RN Assessment  Last BM:  unknown  Height:   Ht Readings from Last 1 Encounters:  04/28/19 5' (1.524 m)    Weight:   Wt Readings from Last 1 Encounters:  05/01/19 49 kg    Ideal Body Weight:  45.4 kg  BMI:  Body mass index is 21.1 kg/m.  Estimated Nutritional Needs:   Kcal:  1350  Protein:  60-75 grams  Fluid:  >1.5 L/day   Cammy Copa., RD, LDN, CNSC See AMiON for contact information

## 2019-05-01 DEATH — deceased

## 2019-05-02 ENCOUNTER — Inpatient Hospital Stay (HOSPITAL_COMMUNITY): Payer: Medicaid Other

## 2019-05-02 LAB — CBC
HCT: 29.2 % — ABNORMAL LOW (ref 36.0–46.0)
Hemoglobin: 9.3 g/dL — ABNORMAL LOW (ref 12.0–15.0)
MCH: 30.7 pg (ref 26.0–34.0)
MCHC: 31.8 g/dL (ref 30.0–36.0)
MCV: 96.4 fL (ref 80.0–100.0)
Platelets: 129 10*3/uL — ABNORMAL LOW (ref 150–400)
RBC: 3.03 MIL/uL — ABNORMAL LOW (ref 3.87–5.11)
RDW: 19.9 % — ABNORMAL HIGH (ref 11.5–15.5)
WBC: 7.3 10*3/uL (ref 4.0–10.5)
nRBC: 0 % (ref 0.0–0.2)

## 2019-05-02 LAB — GLUCOSE, CAPILLARY
Glucose-Capillary: 125 mg/dL — ABNORMAL HIGH (ref 70–99)
Glucose-Capillary: 164 mg/dL — ABNORMAL HIGH (ref 70–99)
Glucose-Capillary: 156 mg/dL — ABNORMAL HIGH (ref 70–99)
Glucose-Capillary: 154 mg/dL — ABNORMAL HIGH (ref 70–99)
Glucose-Capillary: 157 mg/dL — ABNORMAL HIGH (ref 70–99)
Glucose-Capillary: 159 mg/dL — ABNORMAL HIGH (ref 70–99)

## 2019-05-02 LAB — BASIC METABOLIC PANEL
Anion gap: 9 (ref 5–15)
BUN: 10 mg/dL (ref 6–20)
CO2: 25 mmol/L (ref 22–32)
Calcium: 8.3 mg/dL — ABNORMAL LOW (ref 8.9–10.3)
Chloride: 107 mmol/L (ref 98–111)
Creatinine, Ser: 0.42 mg/dL — ABNORMAL LOW (ref 0.44–1.00)
GFR calc Af Amer: >60 mL/min (ref >60)
GFR calc non Af Amer: >60 mL/min (ref >60)
Glucose, Bld: 137 mg/dL — ABNORMAL HIGH (ref 70–99)
Potassium: 4 mmol/L (ref 3.5–5.1)
Sodium: 141 mmol/L (ref 135–145)

## 2019-05-02 LAB — PHOSPHORUS: Phosphorus: 3.5 mg/dL (ref 2.5–4.6)

## 2019-05-02 NOTE — Progress Notes (Signed)
CDS notified of GCS-- To call back if neuro status changes or plan of care.   L pupil now Marin Comment MD and Bedelia Person MD. Elsner MD at bedside to evaluate pt @ 0945.  1045 Pt uncle Deeann Dowse [phone] called for update-- Lovick MD talked with uncle with RN present.   IV team attempted to call Atash for consent.  Will attempt call for consent at later time.

## 2019-05-02 NOTE — Progress Notes (Signed)
Entered room to empty EVD for hourly input- no output and no pulsatile movement noted. Checked drain and appeared to still be in place but with more bloody drainage towards the top of the drain. Pt left pupil now 5 and non-reactive. Notified Elsner who placed orders for a CT. Will continue to monitor

## 2019-05-02 NOTE — Progress Notes (Signed)
Attempted to reach son via telephone through the interpreter times 2 for consent to place PICC with no answer . Primary RN notified and requested that she attempt to get consent and to notify IV team once obtained.

## 2019-05-02 NOTE — Progress Notes (Signed)
Spoke with Dahlia Client, RN regarding consent for PICC.  Per Dahlia Client consent has not been able to be obtained from son.  Requested that MD cancel PICC order until son is able to be contacted for consent.

## 2019-05-02 NOTE — Progress Notes (Signed)
Trauma/Critical Care Follow Up Note  Subjective:    Overnight Issues: Pupil change o/n, no additional intervention performed. Concern for clotting of EVD o/n.  Objective:  Vital signs for last 24 hours: Temp:  [98.3 F (36.8 C)-100.5 F (38.1 C)] 100.3 F (37.9 C) (04/02 0800) Pulse Rate:  [84-108] 108 (04/02 0800) Resp:  [15-19] 16 (04/02 0800) BP: (94-123)/(42-93) 117/76 (04/02 0800) SpO2:  [99 %-100 %] 100 % (04/02 0800) Arterial Line BP: (111-153)/(49-74) 142/67 (04/02 0800) FiO2 (%):  [30 %] 30 % (04/02 0739) Weight:  [49.2 kg] 49.2 kg (04/02 0500)  Hemodynamic parameters for last 24 hours:    Intake/Output from previous day: 04/01 0701 - 04/02 0700 In: 2171.2 [I.V.:1101; NG/GT:870.3; IV Piggyback:199.8] Out: 819 [Urine:750; Drains:69]  Intake/Output this shift: Total I/O In: 82.6 [I.V.:42.6; NG/GT:40] Out: 1 [Drains:1]  Vent settings for last 24 hours: Vent Mode: PRVC FiO2 (%):  [30 %] 30 % Set Rate:  [16 bmp] 16 bmp Vt Set:  [370 mL] 370 mL PEEP:  [5 cmH20] 5 cmH20 Plateau Pressure:  [11 cmH20-19 cmH20] 16 cmH20  Physical Exam:  Gen: no distress Neuro: E1V1M1: 3T, some muscular contraction and elevated HR with noxious stimulus HEENT: intubated Neck: supple CV: RRR Pulm: unlabored breathing, mechanically ventilated Abd: soft, nontender GU: clear, yellow urine, purewick, on menses Extr: wwp, no edema   Results for orders placed or performed during the hospital encounter of May 16, 2019 (from the past 24 hour(s))  Glucose, capillary     Status: Abnormal   Collection Time: 05/01/19 11:50 AM  Result Value Ref Range   Glucose-Capillary 128 (H) 70 - 99 mg/dL  Glucose, capillary     Status: Abnormal   Collection Time: 05/01/19  4:13 PM  Result Value Ref Range   Glucose-Capillary 106 (H) 70 - 99 mg/dL  Glucose, capillary     Status: Abnormal   Collection Time: 05/01/19  7:23 PM  Result Value Ref Range   Glucose-Capillary 126 (H) 70 - 99 mg/dL  Glucose,  capillary     Status: Abnormal   Collection Time: 05/01/19 11:03 PM  Result Value Ref Range   Glucose-Capillary 124 (H) 70 - 99 mg/dL  Glucose, capillary     Status: Abnormal   Collection Time: 05/02/19  3:19 AM  Result Value Ref Range   Glucose-Capillary 125 (H) 70 - 99 mg/dL  CBC     Status: Abnormal   Collection Time: 05/02/19  3:38 AM  Result Value Ref Range   WBC 7.3 4.0 - 10.5 K/uL   RBC 3.03 (L) 3.87 - 5.11 MIL/uL   Hemoglobin 9.3 (L) 12.0 - 15.0 g/dL   HCT 40.9 (L) 81.1 - 91.4 %   MCV 96.4 80.0 - 100.0 fL   MCH 30.7 26.0 - 34.0 pg   MCHC 31.8 30.0 - 36.0 g/dL   RDW 78.2 (H) 95.6 - 21.3 %   Platelets 129 (L) 150 - 400 K/uL   nRBC 0.0 0.0 - 0.2 %  Basic metabolic panel     Status: Abnormal   Collection Time: 05/02/19  3:38 AM  Result Value Ref Range   Sodium 141 135 - 145 mmol/L   Potassium 4.0 3.5 - 5.1 mmol/L   Chloride 107 98 - 111 mmol/L   CO2 25 22 - 32 mmol/L   Glucose, Bld 137 (H) 70 - 99 mg/dL   BUN 10 6 - 20 mg/dL   Creatinine, Ser 0.86 (L) 0.44 - 1.00 mg/dL   Calcium 8.3 (L) 8.9 -  10.3 mg/dL   GFR calc non Af Amer >60 >60 mL/min   GFR calc Af Amer >60 >60 mL/min   Anion gap 9 5 - 15  Phosphorus     Status: None   Collection Time: 05/02/19  3:38 AM  Result Value Ref Range   Phosphorus 3.5 2.5 - 4.6 mg/dL  Glucose, capillary     Status: Abnormal   Collection Time: 05/02/19  8:10 AM  Result Value Ref Range   Glucose-Capillary 164 (H) 70 - 99 mg/dL    Assessment & Plan: The plan of care was discussed with the bedside nurse for the day, Hannah/Kelly, who is in agreement with this plan and no additional concerns were raised.   Present on Admission: . Epidural hematoma ([place]) . Traumatic epidural hematoma with loss of consciousness greater than 24 hours without return to pre-existing conscious level with patient surviving ([place])    LOS: 5 days   Additional comments:I reviewed the patient's new clinical lab test results.   and I reviewed the patients new  imaging test results.    Fall/ETOH  L EDH with shift, bifrontal and bitemporal hemorrhagic contusions - NSGY c/s (D/r Arnoldo Morale), s/p emergent L craniotomy 3/28. Worsened head CT 4/1, to OR emergently for ventriculostomy. Pupil change o/n, no additional intervention performed. Concern for clotting of EVD o/n. ICPs 3-14 over the last 24h. 69cc drained over 24h. Poor prognosis, expected length of disability >1y, likely lifelong. VDRF - PSV okay, neuro status too poor to extubate EtOH abuse - CIWA Transaminitis, thrombocytopenia - presumably from chronic liver dysfunction 2/2 alcohol abuse, continue to monitor. PM-TEG showed good PLT function. PLTs up to 129k,  FEN - cortrak, TF, K is corrected DVT - SCDs, dicsuss with NSGY LMWH in the setting of EVD Dispo -  ICU, Palliative consult appreciated and issues noted. Agree to hold off on trach/PEG until meeting with her son and DSS appointed attorney  Critical Care Total Time: 46 minutes  Jesusita Oka, MD Trauma & General Surgery Please use [email or web address] to contact on call provider  05/02/2019  *Care during the described time interval was provided by me. I have reviewed this patient's available data, including medical history, events of note, physical examination and test results as part of my evaluation.

## 2019-05-02 NOTE — Progress Notes (Signed)
Patient ID: Susan Mcconnell, female   DOB: April 18, 1975, 44 y.o.   MRN: [id number] Events of the past 24 hours have been reviewed.  I spoke with Dr. Lovell Sheehan yesterday regarding the patient's situation and had reviewed yesterday's CT scan prior to him placing the intraventricular catheter.  We both concur that surgical intervention for her massive left frontal temporal and parietal contusions would be pointless and the intraventricular catheter was a last ditch effort to provide some minimal decompression.  Overnight the catheter became obstructed and this morning a repeat CT scan was performed which demonstrates that there is progressive displacement of the brain with herniation syndrome.  Her pupillary responses have been variable but at the current time her left pupil is 4 mm and irregular.  It is nonreactive.  The right pupil is 3 mm and nonreactive.  At this point I do not feel that the patient's condition is survivable for long-term and would advise that we progress with a much less aggressive form of care.  I will reach out to Dr. Bonita Quin to discuss this.  I understand that the primary family member regarding this patient is her son and there is a language barrier.

## 2019-05-02 NOTE — Progress Notes (Signed)
Referred pt to CDS per bedside RN request.  Referral # [id number].  Spoke with [employer] from CDS.  Coordinator will be contacting bedside RN Hannah/Kelly @ [phone] shortly.  Info charted in flowsheet as well.

## 2019-05-02 NOTE — Progress Notes (Signed)
Used the [employer] to speak with Atash in [place]. Atash verbally agreed to consent for PICC line, verified with this RN and Leafy Ro RN. Consent in chart.

## 2019-05-02 NOTE — Progress Notes (Signed)
Patient transported to CT and back to 4N28 without any complications.  

## 2019-05-03 LAB — GLUCOSE, CAPILLARY
Glucose-Capillary: 145 mg/dL — ABNORMAL HIGH (ref 70–99)
Glucose-Capillary: 136 mg/dL — ABNORMAL HIGH (ref 70–99)
Glucose-Capillary: 151 mg/dL — ABNORMAL HIGH (ref 70–99)
Glucose-Capillary: 143 mg/dL — ABNORMAL HIGH (ref 70–99)
Glucose-Capillary: 144 mg/dL — ABNORMAL HIGH (ref 70–99)
Glucose-Capillary: 137 mg/dL — ABNORMAL HIGH (ref 70–99)

## 2019-05-03 LAB — CBC
HCT: 27.7 % — ABNORMAL LOW (ref 36.0–46.0)
Hemoglobin: 8.8 g/dL — ABNORMAL LOW (ref 12.0–15.0)
MCH: 31.3 pg (ref 26.0–34.0)
MCHC: 31.8 g/dL (ref 30.0–36.0)
MCV: 98.6 fL (ref 80.0–100.0)
Platelets: 236 10*3/uL (ref 150–400)
RBC: 2.81 MIL/uL — ABNORMAL LOW (ref 3.87–5.11)
RDW: 19.8 % — ABNORMAL HIGH (ref 11.5–15.5)
WBC: 8.7 10*3/uL (ref 4.0–10.5)
nRBC: 0 % (ref 0.0–0.2)

## 2019-05-03 LAB — BASIC METABOLIC PANEL
Anion gap: 9 (ref 5–15)
BUN: 11 mg/dL (ref 6–20)
CO2: 26 mmol/L (ref 22–32)
Calcium: 8.6 mg/dL — ABNORMAL LOW (ref 8.9–10.3)
Chloride: 109 mmol/L (ref 98–111)
Creatinine, Ser: 0.5 mg/dL (ref 0.44–1.00)
GFR calc Af Amer: >60 mL/min (ref >60)
GFR calc non Af Amer: >60 mL/min (ref >60)
Glucose, Bld: 162 mg/dL — ABNORMAL HIGH (ref 70–99)
Potassium: 3.7 mmol/L (ref 3.5–5.1)
Sodium: 144 mmol/L (ref 135–145)

## 2019-05-03 MED ORDER — SODIUM CHLORIDE 0.9% FLUSH: 10.0000 mL | INTRAVENOUS | Status: DC | PRN

## 2019-05-03 MED ORDER — SODIUM CHLORIDE 0.9% FLUSH
10.0000 mL | Freq: Two times a day (BID) | INTRAVENOUS | Status: DC
  Administered 2019-05-03 – 2019-05-06 (×6): 10 mL
  Administered 2019-05-06: 20 mL
  Administered 2019-05-06: 10 mL
  Administered 2019-05-07: 20 mL
  Administered 2019-05-08: 10 mL

## 2019-05-03 MED ORDER — PANTOPRAZOLE SODIUM 40 MG PO PACK
40.0000 mg | PACK | Freq: Every day | ORAL | Status: DC
  Administered 2019-05-04 – 2019-05-08 (×5): 40 mg
  Filled 2019-05-03 (×5): qty 20

## 2019-05-03 NOTE — Progress Notes (Signed)
Peripherally Inserted Central Catheter Placement  The IV Nurse has discussed with the patient and/or persons authorized to consent for the patient, the purpose of this procedure and the potential benefits and risks involved with this procedure.  The benefits include less needle sticks, lab draws from the catheter, and the patient may be discharged home with the catheter. Risks include, but not limited to, infection, bleeding, blood clot (thrombus formation), and puncture of an artery; nerve damage and irregular heartbeat and possibility to perform a PICC exchange if needed/ordered by physician.  Alternatives to this procedure were also discussed.  Bard Power PICC patient education guide, fact sheet on infection prevention and patient information card has been provided to patient /or left at bedside.    PICC Placement Documentation  PICC Double Lumen 05/03/19 PICC Right Brachial 32 cm 0 cm (Active)  Indication for Insertion or Continuance of Line Limited venous access - need for IV therapy >5 days (PICC only) 05/03/19 1200  Exposed Catheter (cm) 0 cm 05/03/19 1200  Site Assessment Clean;Dry;Intact 05/03/19 1200  Lumen #1 Status Flushed;Blood return noted;Saline locked 05/03/19 1200  Lumen #2 Status Flushed;Blood return noted;Saline locked 05/03/19 1200  Dressing Type Transparent 05/03/19 1200  Dressing Status Clean;Dry;Intact;Antimicrobial disc in place 05/03/19 1200  Line Care Connections checked and tightened 05/03/19 1200  Line Adjustment (NICU/IV Team Only) No 05/03/19 1200  Dressing Intervention New dressing 05/03/19 1200  Dressing Change Due 05/10/19 05/03/19 1200       Edwin Cap 05/03/2019, 12:25 PM

## 2019-05-03 NOTE — Progress Notes (Signed)
Patient ID: Susan Mcconnell, female   DOB: [birth year], 44 y.o.   MRN: [id number] BP 105/69   Pulse 80   Temp 98.6 F (37 C) (Axillary)   Resp 16   Ht 5' (1.524 m)   Wt 49.1 kg   SpO2 100%   BMI 21.14 kg/m  Head CT shows significant shift left to right Pupils not equal, not reactive. Right pupil irregular ~63mm, left pupil round regular ~42mm Poor oculocephalics Corneal intact Decerebrate posturing with suctioning, central noxious stimuli Prognosis is grave

## 2019-05-03 NOTE — Progress Notes (Signed)
6 Days Post-Op   Subjective/Chief Complaint: No change overnight   Objective: Vital signs in last 24 hours: Temp:  [98.6 F (37 C)-100.4 F (38 C)] 98.6 F (37 C) (04/03 0800) Pulse Rate:  [79-99] 85 (04/03 0800) Resp:  [16-21] 21 (04/03 0800) BP: (87-121)/(60-84) 109/74 (04/03 0800) SpO2:  [92 %-100 %] 100 % (04/03 0800) Arterial Line BP: (116-152)/(55-76) 121/55 (04/03 0800) FiO2 (%):  [30 %] 30 % (04/03 0800) Weight:  [49.1 kg] 49.1 kg (04/03 0500) Last BM Date: 05/02/19  Intake/Output from previous day: 04/02 0701 - 04/03 0700 In: 2142.6 [I.V.:1022.6; NG/GT:920; IV Piggyback:200] Out: 3119 [Urine:3100; Drains:19] Intake/Output this shift: Total I/O In: 170 [I.V.:90; NG/GT:80] Out: 0   Gen: no distress Neuro:unresponsive this am, pupils appear baseline HEENT: intubated CV: RRR Pulm: coarse breath sounds bilaterally Abd: soft, nontender GU: clear, yellow urine Extr:  no edema  Lab Results:  Recent Labs    05/02/19 0338 05/03/19 0500  WBC 7.3 8.7  HGB 9.3* 8.8*  HCT 29.2* 27.7*  PLT 129* 236   BMET Recent Labs    05/02/19 0338 05/03/19 0500  NA 141 144  K 4.0 3.7  CL 107 109  CO2 25 26  GLUCOSE 137* 162*  BUN 10 11  CREATININE 0.42* 0.50  CALCIUM 8.3* 8.6*   PT/INR No results for input(s): LABPROT, INR in the last 72 hours. ABG No results for input(s): PHART, HCO3 in the last 72 hours.  Invalid input(s): PCO2, PO2  Studies/Results: CT HEAD WO CONTRAST  Result Date: 05/02/2019 CLINICAL DATA:  Neuro deficit, subacute.  Altered mental status. EXAM: CT HEAD WITHOUT CONTRAST TECHNIQUE: Contiguous axial images were obtained from the base of the skull through the vertex without intravenous contrast. COMPARISON:  Head CT from yesterday FINDINGS: Brain: Extensive hemorrhagic contusion in the left more than right inferior frontal and temporal lobes. The degree of patchy hemorrhage is similar to prior. There is hemorrhage at the deep left internal capsule  which could be from shear. There is prominent swelling with midline shift measuring 11 mm. There is further downward central displacement with cistern effacement. Interval ventriculostomy placement. Mild dilatation of the right lateral ventricle, unchanged. Small volume intraventricular hemorrhage. No recurring extra-axial hemorrhage which is now mainly seen along the tentorium on the left. Stable small volume subarachnoid hemorrhage mainly at the left low sylvian fissure. Vascular: Negative Skull: Left parietal craniotomy. Skull base fracture involving the left temporal bone with primarily longitudinal orientation. Sinuses/Orbits: Stable. IMPRESSION: 1. New ventriculostomy with size stable ventricular volume. 2. Unchanged extensive cerebral contusion asymmetric to the left. Midline shift continues to measure 11 mm but there is progressive downward central migration of the brain. 3. Evacuated left parietal hematoma without recurrence. Unchanged subarachnoid hemorrhage. Electronically Signed   By: Marnee Spring M.D.   On: 05/02/2019 06:11   CT HEAD WO CONTRAST  Result Date: 05/01/2019 CLINICAL DATA:  Provided history: Traumatic brain injury. Epidural hematoma EXAM: CT HEAD WITHOUT CONTRAST TECHNIQUE: Contiguous axial images were obtained from the base of the skull through the vertex without intravenous contrast. COMPARISON:  Prior head CT examinations 04/28/2019 and earlier FINDINGS: Brain: Redemonstrated postoperative sequela from recent left parietal craniotomy for evacuation of an epidural hematoma. Similar appearance of a small amount of extra-axial gas at the hematoma evacuation site. As before, a left frontoparietal subdural hematoma measures approximately 5 mm in thickness. Subdural he hemorrhage layering along the left tentorium is unchanged in thickness, again measuring 5 mm. New from prior exam there  is thin subdural hematoma layering along the posterior inferior falx, which may be secondary to  interval redistribution. Similar appearance of extensive hemorrhagic contusions within the left frontotemporal lobes and to a lesser degree within the right frontotemporal lobes. Scattered subarachnoid hemorrhage overlying the left greater than right cerebral hemispheres and within the left sylvian fissure has also not significantly changed. Unchanged small amount of hemorrhage within the left lateral ventricle (series 4, image 15). Unchanged mass effect with marked effacement of the left lateral and third ventricles. Stable 11 mm rightward midline shift. No evidence of hydrocephalus on the current exam. Vascular: Limited evaluation due to intracranial hemorrhage. Skull: Sequela of prior left parietal craniotomy. Redemonstrated left temporal bone fracture as previously describe. Sinuses/Orbits: Visualized orbits demonstrate no acute abnormality. Partial opacification of the bilateral ethmoid air cells and of the right sphenoid sinus. Air-fluid levels within the right maxillary and left sphenoid sinuses. Fluid within left mastoid air cells. IMPRESSION: 1. Sequela of prior left parietal craniotomy for left parietal epidural hematoma evacuation. 2. New thin subdural hemorrhage layering along the left aspect of posteroinferior falx, which may be due to interval redistribution. Subdural hemorrhage along the left tentorium is unchanged. 3. Stable extensive hemorrhagic contusions within the left frontotemporal lobes, and to a lesser degree within the right frontotemporal lobes. 4. Stable scattered subarachnoid hemorrhage along the left greater than right cerebral hemispheres and within the left sylvian fissure. 5. Stable small volume intraventricular hemorrhage within the left lateral ventricle. 6. Unchanged mass effect with partial effacement of the ventricular system and 11 mm rightward midline shift. Electronically Signed   By: Jackey Loge DO   On: 05/01/2019 10:48   DG CHEST PORT 1 VIEW  Result Date:  05/02/2019 CLINICAL DATA:  44 year old female with history of respiratory failure. EXAM: PORTABLE CHEST 1 VIEW COMPARISON:  Chest x-ray 04/30/2019. FINDINGS: An endotracheal tube is in place with tip 1.8 cm above the carina. A feeding tube is seen extending into the abdomen, however, the tip of the feeding tube extends below the lower margin of the image. Lung volumes are normal. Linear opacity in the periphery of the right lung base which may reflect subsegmental atelectasis and/or scarring. No consolidative airspace disease. No pleural effusions. No pneumothorax. No pulmonary nodule or mass noted. Pulmonary vasculature and the cardiomediastinal silhouette are within normal limits. IMPRESSION: 1. Support apparatus, as above. 2. Small amount of subsegmental atelectasis or scarring in the periphery of the right lung base. Electronically Signed   By: Trudie Reed M.D.   On: 05/02/2019 06:36    Anti-infectives: Anti-infectives (From admission, onward)   Start     Dose/Rate Route Frequency Ordered Stop   04/02/2019 1400  ceFAZolin (ANCEF) IVPB 1 g/50 mL premix     1 g 100 mL/hr over 30 Minutes Intravenous Every 8 hours 04/15/2019 1252 04/28/19 0651   04/30/2019 0916  bacitracin 50,000 Units in sodium chloride 0.9 % 500 mL irrigation  Status:  Discontinued       As needed 04/04/2019 0916 04/11/2019 1044      Assessment/Plan: Fall/ETOH = L EDH with shift, bifrontal and bitemporal hemorrhagic contusions- NSGY c/s (D/r Lovell Sheehan), s/p emergent L craniotomy 3/28. Worsened head CT 4/1, to OR emergently for ventriculostomy. Pupil change o/n, no additional intervention performed.  Poor prognosis likely not injury can be functional from moving forward, she is still full code per family VDRF- PSV, neuro status too poor to extubate EtOH abuse - CIWA FEN - cortrak, TF,K is normal DVT -  SCDs, dicsuss with NSGY LMWH in the setting of EVD will await their recs Dispo - ICU, Palliative consult. Agree to hold off on  trach/PEG until meeting with her son and DSS appointed attorney  Critical Care Total Time: 35 minutes  Rolm Bookbinder 05/03/2019

## 2019-05-04 LAB — GLUCOSE, CAPILLARY
Glucose-Capillary: 142 mg/dL — ABNORMAL HIGH (ref 70–99)
Glucose-Capillary: 124 mg/dL — ABNORMAL HIGH (ref 70–99)
Glucose-Capillary: 119 mg/dL — ABNORMAL HIGH (ref 70–99)
Glucose-Capillary: 121 mg/dL — ABNORMAL HIGH (ref 70–99)

## 2019-05-04 LAB — BASIC METABOLIC PANEL
Anion gap: 10 (ref 5–15)
BUN: 13 mg/dL (ref 6–20)
CO2: 26 mmol/L (ref 22–32)
Calcium: 8.7 mg/dL — ABNORMAL LOW (ref 8.9–10.3)
Chloride: 112 mmol/L — ABNORMAL HIGH (ref 98–111)
Creatinine, Ser: <0.3 mg/dL — ABNORMAL LOW (ref 0.44–1.00)
Glucose, Bld: 140 mg/dL — ABNORMAL HIGH (ref 70–99)
Potassium: 3.5 mmol/L (ref 3.5–5.1)
Sodium: 148 mmol/L — ABNORMAL HIGH (ref 135–145)

## 2019-05-04 LAB — CBC
HCT: 28.5 % — ABNORMAL LOW (ref 36.0–46.0)
Hemoglobin: 8.6 g/dL — ABNORMAL LOW (ref 12.0–15.0)
MCH: 30.5 pg (ref 26.0–34.0)
MCHC: 30.2 g/dL (ref 30.0–36.0)
MCV: 101.1 fL — ABNORMAL HIGH (ref 80.0–100.0)
Platelets: 300 10*3/uL (ref 150–400)
RBC: 2.82 MIL/uL — ABNORMAL LOW (ref 3.87–5.11)
RDW: 19.8 % — ABNORMAL HIGH (ref 11.5–15.5)
WBC: 9 10*3/uL (ref 4.0–10.5)
nRBC: 0.6 % — ABNORMAL HIGH (ref 0.0–0.2)

## 2019-05-04 NOTE — Progress Notes (Signed)
Patient ID: Jocilyn Trego, female   DOB: February 01, 1975, 44 y.o.   MRN: [id number] BP 131/85   Pulse 78   Temp 98.3 F (36.8 C) (Axillary)   Resp 16   Ht 5' (1.524 m)   Wt 48.7 kg   SpO2 100%   BMI 20.97 kg/m  Not responsive to voice, comatose Decerebrate posturing with stimulation Pupils unequaL, not responsive to light No corneals Gag is present No change since yesterday.  Grave prognosis

## 2019-05-04 NOTE — Progress Notes (Signed)
Trauma/Critical Care Follow Up Note  Subjective:    Overnight Issues:   Objective:  Vital signs for last 24 hours: Temp:  [97.8 F (36.6 C)-98.5 F (36.9 C)] 98.3 F (36.8 C) (04/04 0800) Pulse Rate:  [71-98] 79 (04/04 0800) Resp:  [14-21] 19 (04/04 0800) BP: (103-124)/(68-88) 117/77 (04/04 0800) SpO2:  [94 %-100 %] 100 % (04/04 0800) Arterial Line BP: (130-162)/(57-79) 148/65 (04/03 1500) FiO2 (%):  [30 %] 30 % (04/04 0800) Weight:  [48.7 kg] 48.7 kg (04/04 0500)  Hemodynamic parameters for last 24 hours:    Intake/Output from previous day: 04/03 0701 - 04/04 0700 In: 2124.2 [I.V.:1064.2; NG/GT:960; IV Piggyback:100] Out: 857 [Urine:850; Drains:7]  Intake/Output this shift: Total I/O In: 170 [I.V.:90; NG/GT:80] Out: 2 [Drains:2]  Vent settings for last 24 hours: Vent Mode: CPAP;PSV FiO2 (%):  [30 %] 30 % Set Rate:  [16 bmp] 16 bmp Vt Set:  [370 mL] 370 mL PEEP:  [5 cmH20] 5 cmH20 Pressure Support:  [8 cmH20-10 cmH20] 8 cmH20 Plateau Pressure:  [15 cmH20-17 cmH20] 17 cmH20  Physical Exam:  Gen: comfortable, no distress Neuro: E1V1M2 minimal extension to noxious stimulus with RUE, no movement of other extremities HEENT: intubated Neck: supple CV: RRR Pulm: unlabored breathing, mechanically ventilated Abd: soft, nontender GU: clear, yellow urine Extr: wwp, no edema   Results for orders placed or performed during the hospital encounter of 05/22/19 (from the past 24 hour(s))  Glucose, capillary     Status: Abnormal   Collection Time: 05/03/19 12:34 PM  Result Value Ref Range   Glucose-Capillary 151 (H) 70 - 99 mg/dL  Glucose, capillary     Status: Abnormal   Collection Time: 05/03/19  3:45 PM  Result Value Ref Range   Glucose-Capillary 143 (H) 70 - 99 mg/dL  Glucose, capillary     Status: Abnormal   Collection Time: 05/03/19  7:36 PM  Result Value Ref Range   Glucose-Capillary 144 (H) 70 - 99 mg/dL  Glucose, capillary     Status: Abnormal   Collection Time: 05/03/19 11:13 PM  Result Value Ref Range   Glucose-Capillary 137 (H) 70 - 99 mg/dL  Glucose, capillary     Status: Abnormal   Collection Time: 05/04/19  3:24 AM  Result Value Ref Range   Glucose-Capillary 142 (H) 70 - 99 mg/dL  CBC     Status: Abnormal   Collection Time: 05/04/19  5:00 AM  Result Value Ref Range   WBC 9.0 4.0 - 10.5 K/uL   RBC 2.82 (L) 3.87 - 5.11 MIL/uL   Hemoglobin 8.6 (L) 12.0 - 15.0 g/dL   HCT 82.4 (L) 23.5 - 36.1 %   MCV 101.1 (H) 80.0 - 100.0 fL   MCH 30.5 26.0 - 34.0 pg   MCHC 30.2 30.0 - 36.0 g/dL   RDW 44.3 (H) 15.4 - 00.8 %   Platelets 300 150 - 400 K/uL   nRBC 0.6 (H) 0.0 - 0.2 %  Basic metabolic panel     Status: Abnormal   Collection Time: 05/04/19  5:00 AM  Result Value Ref Range   Sodium 148 (H) 135 - 145 mmol/L   Potassium 3.5 3.5 - 5.1 mmol/L   Chloride 112 (H) 98 - 111 mmol/L   CO2 26 22 - 32 mmol/L   Glucose, Bld 140 (H) 70 - 99 mg/dL   BUN 13 6 - 20 mg/dL   Creatinine, Ser <6.76 (L) 0.44 - 1.00 mg/dL   Calcium 8.7 (L) 8.9 - 10.3  mg/dL   GFR calc non Af Amer NOT CALCULATED >60 mL/min   GFR calc Af Amer NOT CALCULATED >60 mL/min   Anion gap 10 5 - 15  Glucose, capillary     Status: Abnormal   Collection Time: 05/04/19  8:03 AM  Result Value Ref Range   Glucose-Capillary 124 (H) 70 - 99 mg/dL    Assessment & Plan: The plan of care was discussed with the bedside nurse for the day, Abby, who is in agreement with this plan and no additional concerns were raised.   Present on Admission: . Epidural hematoma ([place]) . Traumatic epidural hematoma with loss of consciousness greater than 24 hours without return to pre-existing conscious level with patient surviving ([place])    LOS: 7 days   Additional comments:I reviewed the patient's new clinical lab test results.    Fall/ETOH  L EDH with shift, bifrontal and bitemporal hemorrhagic contusions- NSGY c/s (D/r Arnoldo Morale), s/p emergent L craniotomy 3/28.Worsened head CT 4/1, to  OR emergently for ventriculostomy. Pupil change o/n, no additional intervention performed.  Poor prognosis likely not injury can be functional from moving forward, she is still full code per family. VDRF-PSV, neuro status too poor to extubate EtOH abuse - CIWA FEN - cortrak, TF DVT - SCDs,dicsuss with [place] in the setting of EVD will await their recs Dispo - ICU, Palliative consult. Agree to hold off on trach/PEG until meeting with her son and DSS appointed attorney  Critical Care Total Time: 35 minutes  Jesusita Oka, MD Trauma & General Surgery Please use [email or web address] to contact on call provider  05/04/2019  *Care during the described time interval was provided by me. I have reviewed this patient's available data, including medical history, events of note, physical examination and test results as part of my evaluation.

## 2019-05-05 LAB — BASIC METABOLIC PANEL
Anion gap: 9 (ref 5–15)
BUN: 13 mg/dL (ref 6–20)
CO2: 27 mmol/L (ref 22–32)
Calcium: 8.5 mg/dL — ABNORMAL LOW (ref 8.9–10.3)
Chloride: 113 mmol/L — ABNORMAL HIGH (ref 98–111)
Creatinine, Ser: 0.31 mg/dL — ABNORMAL LOW (ref 0.44–1.00)
GFR calc Af Amer: >60 mL/min (ref >60)
GFR calc non Af Amer: >60 mL/min (ref >60)
Glucose, Bld: 123 mg/dL — ABNORMAL HIGH (ref 70–99)
Potassium: 3.2 mmol/L — ABNORMAL LOW (ref 3.5–5.1)
Sodium: 149 mmol/L — ABNORMAL HIGH (ref 135–145)

## 2019-05-05 LAB — GLUCOSE, CAPILLARY
Glucose-Capillary: 104 mg/dL — ABNORMAL HIGH (ref 70–99)
Glucose-Capillary: 123 mg/dL — ABNORMAL HIGH (ref 70–99)
Glucose-Capillary: 124 mg/dL — ABNORMAL HIGH (ref 70–99)
Glucose-Capillary: 128 mg/dL — ABNORMAL HIGH (ref 70–99)
Glucose-Capillary: 129 mg/dL — ABNORMAL HIGH (ref 70–99)
Glucose-Capillary: 139 mg/dL — ABNORMAL HIGH (ref 70–99)
Glucose-Capillary: 143 mg/dL — ABNORMAL HIGH (ref 70–99)

## 2019-05-05 LAB — CBC
HCT: 27 % — ABNORMAL LOW (ref 36.0–46.0)
Hemoglobin: 8.2 g/dL — ABNORMAL LOW (ref 12.0–15.0)
MCH: 30.4 pg (ref 26.0–34.0)
MCHC: 30.4 g/dL (ref 30.0–36.0)
MCV: 100 fL (ref 80.0–100.0)
Platelets: 345 10*3/uL (ref 150–400)
RBC: 2.7 MIL/uL — ABNORMAL LOW (ref 3.87–5.11)
RDW: 19.7 % — ABNORMAL HIGH (ref 11.5–15.5)
WBC: 11.4 10*3/uL — ABNORMAL HIGH (ref 4.0–10.5)
nRBC: 1 % — ABNORMAL HIGH (ref 0.0–0.2)

## 2019-05-05 LAB — PHOSPHORUS: Phosphorus: 3.2 mg/dL (ref 2.5–4.6)

## 2019-05-05 LAB — MAGNESIUM: Magnesium: 2.1 mg/dL (ref 1.7–2.4)

## 2019-05-05 MED ORDER — POTASSIUM CHLORIDE 10 MEQ/50ML IV SOLN
10.0000 meq | INTRAVENOUS | Status: AC
  Administered 2019-05-05 (×3): 10 meq via INTRAVENOUS
  Filled 2019-05-05 (×3): qty 50

## 2019-05-05 NOTE — Progress Notes (Signed)
Patient ID: Susan Mcconnell, female   DOB: 1975-05-05, 44 y.o.   MRN: [id number] Follow up - Trauma Critical Care  Patient Details:    Susan Mcconnell is an 44 y.o. female.  Lines/tubes : Airway (Active)  Secured at (cm) 24 cm 05/05/19 0312  Measured From Lips 05/05/19 0312  Secured Location Center 05/05/19 0312  Secured By [employer] 05/05/19 0312  Tube Holder Repositioned Yes 05/05/19 0312  Cuff Pressure (cm H2O) 24 cm H2O 05/03/19 0724  Site Condition Dry 05/05/19 0312     PICC Double Lumen 05/03/19 PICC Right Brachial 32 cm 0 cm (Active)  Indication for Insertion or Continuance of Line Limited venous access - need for IV therapy >5 days (PICC only) 05/04/19 2000  Exposed Catheter (cm) 0 cm 05/03/19 1200  Site Assessment Clean;Dry;Intact 05/04/19 2000  Lumen #1 Status Infusing 05/04/19 2000  Lumen #2 Status Infusing;In-line blood sampling system in place 05/04/19 2000  Dressing Type Transparent 05/04/19 2000  Dressing Status Clean;Dry;Intact;Antimicrobial disc in place 05/04/19 2000  Line Care Connections checked and tightened 05/04/19 2000  Line Adjustment (NICU/IV Team Only) No 05/03/19 1200  Dressing Intervention New dressing 05/03/19 1200  Dressing Change Due 05/10/19 05/04/19 2000     External Urinary Catheter (Active)  Collection Container Dedicated Suction Canister 05/04/19 2000  Securement Method Securing device (Describe) 05/04/19 2000  Site Assessment Clean;Intact;Dry 05/04/19 2000  Intervention Equipment Changed 04/30/19 2000  Output (mL) 750 mL 05/05/19 0600     ICP/Ventriculostomy Ventricular drainage catheter with ICP monitoring Right Parietal region (Active)  Drain Status Open 05/04/19 2000  Level Measured in cm H2O 10 cm H2O 05/04/19 2000  Status Open to continuous drainage 05/04/19 2000  CSF Color Clear 05/04/19 2000  Site Assessment Clean;Dry 05/04/19 2000  Dressing Status Old drainage 05/04/19 2000  Dressing Intervention Other (Comment) 05/04/19  2000  Output (mL) 0 mL 05/05/19 0700    Microbiology/Sepsis markers: Results for orders placed or performed during the hospital encounter of 2019-05-24  Respiratory Panel by RT PCR (Flu A&B, Covid) - Nasopharyngeal Swab     Status: None   Collection Time: May 24, 2019  6:01 AM   Specimen: Nasopharyngeal Swab  Result Value Ref Range Status   SARS Coronavirus 2 by RT PCR NEGATIVE NEGATIVE Final    Comment: (NOTE) SARS-CoV-2 target nucleic acids are NOT DETECTED. The SARS-CoV-2 RNA is generally detectable in upper respiratoy specimens during the acute phase of infection. The lowest concentration of SARS-CoV-2 viral copies this assay can detect is 131 copies/mL. A negative result does not preclude SARS-Cov-2 infection and should not be used as the sole basis for treatment or other patient management decisions. A negative result may occur with  improper specimen collection/handling, submission of specimen other than nasopharyngeal swab, presence of viral mutation(s) within the areas targeted by this assay, and inadequate number of viral copies (<131 copies/mL). A negative result must be combined with clinical observations, patient history, and epidemiological information. The expected result is Negative. Fact Sheet for Patients:  https://www.moore.com/ Fact Sheet for Healthcare Providers:  https://www.young.biz/ This test is not yet ap proved or cleared by the [place] FDA and  has been authorized for detection and/or diagnosis of SARS-CoV-2 by FDA under an Emergency Use Authorization (EUA). This EUA will remain  in effect (meaning this test can be used) for the duration of the COVID-19 declaration under Section 564(b)(1) of the Act, 21 U.S.C. section 360bbb-3(b)(1), unless the authorization is terminated or revoked sooner.    Influenza A  by PCR NEGATIVE NEGATIVE Final   Influenza B by PCR NEGATIVE NEGATIVE Final    Comment: (NOTE) The Xpert  Xpress SARS-CoV-2/FLU/RSV assay is intended as an aid in  the diagnosis of influenza from Nasopharyngeal swab specimens and  should not be used as a sole basis for treatment. Nasal washings and  aspirates are unacceptable for Xpert Xpress SARS-CoV-2/FLU/RSV  testing. Fact Sheet for Patients: https://www.moore.com/ Fact Sheet for Healthcare Providers: https://www.young.biz/ This test is not yet approved or cleared by the [place] FDA and  has been authorized for detection and/or diagnosis of SARS-CoV-2 by  FDA under an Emergency Use Authorization (EUA). This EUA will remain  in effect (meaning this test can be used) for the duration of the  Covid-19 declaration under Section 564(b)(1) of the Act, 21  U.S.C. section 360bbb-3(b)(1), unless the authorization is  terminated or revoked. Performed at [facility], [address], [place], [place] [address]   MRSA PCR Screening     Status: None   Collection Time: May 04, 2019 11:03 AM   Specimen: Nasopharyngeal  Result Value Ref Range Status   MRSA by PCR NEGATIVE NEGATIVE Final    Comment:        The GeneXpert MRSA Assay (FDA approved for NASAL specimens only), is one component of a comprehensive MRSA colonization surveillance program. It is not intended to diagnose MRSA infection nor to guide or monitor treatment for MRSA infections. Performed at [facility], [address] [place], [place] [address]     Anti-infectives:  Anti-infectives (From admission, onward)   Start     Dose/Rate Route Frequency Ordered Stop   05-04-19 1400  ceFAZolin (ANCEF) IVPB 1 g/50 mL premix     1 g 100 mL/hr over 30 Minutes Intravenous Every 8 hours 05/04/19 1252 04/28/19 0651   05/04/2019 0916  bacitracin 50,000 Units in sodium chloride 0.9 % 500 mL irrigation  Status:  Discontinued       As needed May 04, 2019 0916 May 04, 2019 1044      Best Practice/Protocols:  VTE Prophylaxis: Mechanical no  sedation  Consults: Treatment Team:  Tressie Stalker, MD    Studies:    Events:  Subjective:    Overnight Issues:   Objective:  Vital signs for last 24 hours: Temp:  [97.9 F (36.6 C)-100.6 F (38.1 C)] 97.9 F (36.6 C) (04/05 0800) Pulse Rate:  [71-105] 87 (04/05 0700) Resp:  [13-23] 16 (04/05 0700) BP: (109-139)/(76-103) 138/93 (04/05 0700) SpO2:  [97 %-100 %] 100 % (04/05 0700) FiO2 (%):  [30 %] 30 % (04/05 0312) Weight:  [49.7 kg] 49.7 kg (04/05 0500)  Hemodynamic parameters for last 24 hours:    Intake/Output from previous day: 04/04 0701 - 04/05 0700 In: 1676.1 [I.V.:1116.1; NG/GT:560] Out: 1657 [Urine:1650; Drains:7]  Intake/Output this shift: No intake/output data recorded.  Vent settings for last 24 hours: Vent Mode: PRVC FiO2 (%):  [30 %] 30 % Set Rate:  [16 bmp] 16 bmp Vt Set:  [370 mL] 370 mL PEEP:  [5 cmH20] 5 cmH20 Pressure Support:  [8 cmH20] 8 cmH20 Plateau Pressure:  [14 cmH20] 14 cmH20  Physical Exam:  General: on vent Neuro: L pupil 46mm fixed, decorticate posture BUE HEENT/Neck: ETT Resp: clear to auscultation bilaterally CVS: RRR GI: soft, nontender, BS WNL, no r/g Extremities: no edema  Results for orders placed or performed during the hospital encounter of 04-May-2019 (from the past 24 hour(s))  Glucose, capillary     Status: Abnormal   Collection  Time: 05/04/19 11:51 AM  Result Value Ref Range   Glucose-Capillary 119 (H) 70 - 99 mg/dL  Glucose, capillary     Status: Abnormal   Collection Time: 05/04/19  3:25 PM  Result Value Ref Range   Glucose-Capillary 121 (H) 70 - 99 mg/dL  Glucose, capillary     Status: Abnormal   Collection Time: 05/05/19 12:09 AM  Result Value Ref Range   Glucose-Capillary 143 (H) 70 - 99 mg/dL  Glucose, capillary     Status: Abnormal   Collection Time: 05/05/19  4:11 AM  Result Value Ref Range   Glucose-Capillary 139 (H) 70 - 99 mg/dL  CBC     Status: Abnormal   Collection Time: 05/05/19  5:00 AM    Result Value Ref Range   WBC 11.4 (H) 4.0 - 10.5 K/uL   RBC 2.70 (L) 3.87 - 5.11 MIL/uL   Hemoglobin 8.2 (L) 12.0 - 15.0 g/dL   HCT 94.8 (L) 54.6 - 27.0 %   MCV 100.0 80.0 - 100.0 fL   MCH 30.4 26.0 - 34.0 pg   MCHC 30.4 30.0 - 36.0 g/dL   RDW 35.0 (H) 09.3 - 81.8 %   Platelets 345 150 - 400 K/uL   nRBC 1.0 (H) 0.0 - 0.2 %  Basic metabolic panel     Status: Abnormal   Collection Time: 05/05/19  5:00 AM  Result Value Ref Range   Sodium 149 (H) 135 - 145 mmol/L   Potassium 3.2 (L) 3.5 - 5.1 mmol/L   Chloride 113 (H) 98 - 111 mmol/L   CO2 27 22 - 32 mmol/L   Glucose, Bld 123 (H) 70 - 99 mg/dL   BUN 13 6 - 20 mg/dL   Creatinine, Ser 2.99 (L) 0.44 - 1.00 mg/dL   Calcium 8.5 (L) 8.9 - 10.3 mg/dL   GFR calc non Af Amer >60 >60 mL/min   GFR calc Af Amer >60 >60 mL/min   Anion gap 9 5 - 15  Magnesium     Status: None   Collection Time: 05/05/19  5:00 AM  Result Value Ref Range   Magnesium 2.1 1.7 - 2.4 mg/dL  Phosphorus     Status: None   Collection Time: 05/05/19  5:00 AM  Result Value Ref Range   Phosphorus 3.2 2.5 - 4.6 mg/dL  Glucose, capillary     Status: Abnormal   Collection Time: 05/05/19  8:10 AM  Result Value Ref Range   Glucose-Capillary 129 (H) 70 - 99 mg/dL    Assessment & Plan: Present on Admission: . Epidural hematoma ([facility]) . Traumatic epidural hematoma with loss of consciousness greater than 24 hours without return to pre-existing conscious level with patient surviving ([facility])    LOS: 8 days   Additional comments:I reviewed the patient's new clinical lab test results. . Fall/ETOH  L EDH with shift, bifrontal and bitemporal hemorrhagic contusions- NSGY c/s (D/r Lovell Sheehan), s/p emergent L craniotomy 3/28.Worsened head CT 4/1, NS placed ventriculostomy. Ventric stopped draining and was removed yesterday. Has uncal herniation and grave prognosis. VDRF-does some PSV weaning, neuro status too poor to extubate EtOH abuse - CIWA FEN - cortrak, TF, replete  hypokalemia DVT - SCDs,dicsuss with NSGYLMWH now that EVD out Dispo - ICU, Palliative consult. Agree to hold off on trach/PEG until meeting with her son and DSS appointed attorney. Her prognosis for any meaningful recovery is dismal and I agree with NS that transition to comfort care should be considered. Critical Care Total Time*: 38 Minutes  Georganna Skeans, MD, MPH, FACS Trauma & General Surgery Use [email or web address] to contact on call provider  05/05/2019  *Care during the described time interval was provided by me. I have reviewed this patient's available data, including medical history, events of note, physical examination and test results as part of my evaluation.

## 2019-05-05 NOTE — Progress Notes (Signed)
Subjective: The patient is intubated and comatose.  She is in no apparent distress.  Objective: Vital signs in last 24 hours: Temp:  [98.5 F (36.9 C)-100.6 F (38.1 C)] 98.5 F (36.9 C) (04/05 0400) Pulse Rate:  [71-105] 87 (04/05 0700) Resp:  [13-23] 16 (04/05 0700) BP: (109-139)/(76-103) 138/93 (04/05 0700) SpO2:  [97 %-100 %] 100 % (04/05 0700) FiO2 (%):  [30 %] 30 % (04/05 0312) Weight:  [49.7 kg] 49.7 kg (04/05 0500) Estimated body mass index is 21.4 kg/m as calculated from the following:   Height as of this encounter: 5' (1.524 m).   Weight as of this encounter: 49.7 kg.   Intake/Output from previous day: 04/04 0701 - 04/05 0700 In: 1676.1 [I.V.:1116.1; NG/GT:560] Out: 1657 [Urine:1650; Drains:7] Intake/Output this shift: No intake/output data recorded.  Physical exam scale coma scale 5, intubated, E1M3V1.  She decorticate postures bilaterally.  Her left pupil was 5 mm and irregular and nonreactive.  Right pupil is 3 mm, regular, and nonreactive.  Lab Results: Recent Labs    05/04/19 0500 05/05/19 0500  WBC 9.0 11.4*  HGB 8.6* 8.2*  HCT 28.5* 27.0*  PLT 300 345   BMET Recent Labs    05/04/19 0500 05/05/19 0500  NA 148* 149*  K 3.5 3.2*  CL 112* 113*  CO2 26 27  GLUCOSE 140* 123*  BUN 13 13  CREATININE <0.30* 0.31*  CALCIUM 8.7* 8.5*    Studies/Results: No results found.  Assessment/Plan: Traumatic brain injury, cerebral contusions, brain edema, uncal herniation: The patient's prognosis for meaningful recovery seems quite remote.  I would recommend withdrawal of support and comfort care measures only, but the patient's social situation complicates matters.  I removed her ventriculostomy as it has not functioned for days.  LOS: 8 days     Cristi Loron 05/05/2019, 8:00 AM

## 2019-05-05 NOTE — Progress Notes (Signed)
MD rounding at bedside. Patient receiving a bath after urinary  incontinence and purewick not working. Order to place foley given. Urinary retention noted after foley placed. Palliative meeting planned for tomorrow and may be able to keep foley for end of life.

## 2019-05-06 DIAGNOSIS — Z9911 Dependence on respirator [ventilator] status: Secondary | ICD-10-CM

## 2019-05-06 DIAGNOSIS — Z7189 Other specified counseling: Secondary | ICD-10-CM

## 2019-05-06 LAB — BASIC METABOLIC PANEL
Anion gap: 12 (ref 5–15)
BUN: 11 mg/dL (ref 6–20)
CO2: 26 mmol/L (ref 22–32)
Calcium: 8.8 mg/dL — ABNORMAL LOW (ref 8.9–10.3)
Chloride: 107 mmol/L (ref 98–111)
Creatinine, Ser: 0.37 mg/dL — ABNORMAL LOW (ref 0.44–1.00)
GFR calc Af Amer: >60 mL/min (ref >60)
GFR calc non Af Amer: >60 mL/min (ref >60)
Glucose, Bld: 135 mg/dL — ABNORMAL HIGH (ref 70–99)
Potassium: 3.7 mmol/L (ref 3.5–5.1)
Sodium: 145 mmol/L (ref 135–145)

## 2019-05-06 LAB — CBC
HCT: 28.7 % — ABNORMAL LOW (ref 36.0–46.0)
Hemoglobin: 8.9 g/dL — ABNORMAL LOW (ref 12.0–15.0)
MCH: 30.5 pg (ref 26.0–34.0)
MCHC: 31 g/dL (ref 30.0–36.0)
MCV: 98.3 fL (ref 80.0–100.0)
Platelets: 435 10*3/uL — ABNORMAL HIGH (ref 150–400)
RBC: 2.92 MIL/uL — ABNORMAL LOW (ref 3.87–5.11)
RDW: 19.7 % — ABNORMAL HIGH (ref 11.5–15.5)
WBC: 16.1 10*3/uL — ABNORMAL HIGH (ref 4.0–10.5)
nRBC: 0.6 % — ABNORMAL HIGH (ref 0.0–0.2)

## 2019-05-06 LAB — GLUCOSE, CAPILLARY
Glucose-Capillary: 102 mg/dL — ABNORMAL HIGH (ref 70–99)
Glucose-Capillary: 105 mg/dL — ABNORMAL HIGH (ref 70–99)
Glucose-Capillary: 108 mg/dL — ABNORMAL HIGH (ref 70–99)
Glucose-Capillary: 113 mg/dL — ABNORMAL HIGH (ref 70–99)
Glucose-Capillary: 126 mg/dL — ABNORMAL HIGH (ref 70–99)
Glucose-Capillary: 90 mg/dL (ref 70–99)

## 2019-05-06 LAB — PHOSPHORUS: Phosphorus: 4.5 mg/dL (ref 2.5–4.6)

## 2019-05-06 LAB — MAGNESIUM: Magnesium: 2.2 mg/dL (ref 1.7–2.4)

## 2019-05-06 MED ORDER — ALBUMIN HUMAN 5 % IV SOLN
INTRAVENOUS | Status: AC
  Administered 2019-05-06: 25 g via INTRAVENOUS
  Filled 2019-05-06: qty 500

## 2019-05-06 MED ORDER — ALBUMIN HUMAN 5 % IV SOLN: 25.0000 g | Freq: Once | INTRAVENOUS | Status: AC

## 2019-05-06 NOTE — Progress Notes (Signed)
  Patient ID: Susan Mcconnell, female   DOB: 1975-06-08, 44 y.o.   MRN: [id number]  This NP visited patient at the bedside as a follow up for palliative medicine needs and emotional support, and for scheduled meeting with patient's son, patient's sister and brother-in-law.  [name] interpreter present.  It has been established that the patient's son is the main decision maker for patient Susan Mcconnell member support person/ Susan Mcconnell attended last half of goals of care meeting.  Dr. Janee Morn present and updated family on current medical situation; treatment option decisions, prognosis, and medical treatment plan.  I had a detailed discussion was had regarding the seriousness of the patient's current medical situation, her continued decline in spite of maximum medical interventions. Detailed conversation regarding the extent of the brain injury and the likelihood that the patient would have no meaningful neurologic recovery was had.  Dr. Janee Morn shared with the family that based on her current herniation any consideration for tracheostomy or PEG is not recommended..   Created space and opportunity for son to explore his thoughts and feelings regarding current medical situation.   Emotional support offered  Patient's son and other family members verbalized an understanding of the seriousness of the patient's current medical situation.  For now the plan is to continue with current medical interventions, (understanding tracheostomy and PEG are not appropriate,)  over the next few days.  Son specifically asked for more time so that he can support and educate his younger siblings on the likelihood that his mother will not survive this hospitalization.  PMT will continue to support holistically, encourage ongoing conversations and help in navigating difficult decisions ensuring that treatment plan is within the context of the patient's best interest..  Family was made aware that anything could  happen at any time.   Coordinated with Christina/DSS worker video visit with the patient's 2 other children; a 68 year old daughter and a 42 year old son  Questions and concerns addressed    Total time spent on the unit was 90  minutes  Greater than 50% of the time was spent in counseling and coordination of care  Susan Creed NP  Palliative Medicine Team Team Phone # [phone] Pager [phone]

## 2019-05-06 NOTE — Progress Notes (Signed)
Patient ID: Susan Mcconnell, female   DOB: May 19, 1975, 44 y.o.   MRN: [id number] Follow up - Trauma Critical Care  Patient Details:    Susan Mcconnell is an 44 y.o. female.  Lines/tubes : Airway (Active)  Secured at (cm) 24 cm 05/06/19 0740  Measured From Lips 05/06/19 0740  Secured Location Left 05/06/19 0740  Secured By [employer] 05/06/19 0740  Tube Holder Repositioned Yes 05/06/19 0740  Cuff Pressure (cm H2O) 28 cm H2O 05/05/19 1951  Site Condition Dry 05/06/19 0411     PICC Double Lumen 05/03/19 PICC Right Brachial 32 cm 0 cm (Active)  Indication for Insertion or Continuance of Line Prolonged intravenous therapies 05/06/19 0734  Exposed Catheter (cm) 0 cm 05/03/19 1200  Site Assessment Clean;Dry;Intact 05/05/19 2000  Lumen #1 Status Infusing 05/05/19 2000  Lumen #2 Status Infusing;In-line blood sampling system in place;Blood return noted;Flushed 05/05/19 2000  Dressing Type Transparent;Occlusive 05/05/19 2000  Dressing Status Clean;Dry;Intact;Antimicrobial disc in place 05/05/19 Bryant checked and tightened 05/05/19 2000  Line Adjustment (NICU/IV Team Only) No 05/03/19 1200  Dressing Intervention New dressing 05/03/19 1200  Dressing Change Due 05/10/19 05/05/19 2000     Urethral Catheter [name] 14 Fr. (Active)  Indication for Insertion or Continuance of Catheter Acute urinary retention (I&O Cath for 24 hrs prior to catheter insertion- Inpatient Only);End of life comfort care 05/06/19 0734  Site Assessment Clean;Intact 05/06/19 0737  Catheter Maintenance Drainage bag/tubing not touching floor;Bag below level of bladder;Catheter secured;Insertion date on drainage bag;No dependent loops;Seal intact 05/06/19 0737  Collection Container Standard drainage bag 05/06/19 0737  Securement Method Securing device (Describe) 05/06/19 0737  Urinary Catheter Interventions (if applicable) Unclamped 62/22/97 2000  Output (mL) 300 mL 05/06/19 0400     Microbiology/Sepsis markers: Results for orders placed or performed during the hospital encounter of 04/18/2019  Respiratory Panel by RT PCR (Flu A&B, Covid) - Nasopharyngeal Swab     Status: None   Collection Time: 04/30/2019  6:01 AM   Specimen: Nasopharyngeal Swab  Result Value Ref Range Status   SARS Coronavirus 2 by RT PCR NEGATIVE NEGATIVE Final    Comment: (NOTE) SARS-CoV-2 target nucleic acids are NOT DETECTED. The SARS-CoV-2 RNA is generally detectable in upper respiratoy specimens during the acute phase of infection. The lowest concentration of SARS-CoV-2 viral copies this assay can detect is 131 copies/mL. A negative result does not preclude SARS-Cov-2 infection and should not be used as the sole basis for treatment or other patient management decisions. A negative result may occur with  improper specimen collection/handling, submission of specimen other than nasopharyngeal swab, presence of viral mutation(s) within the areas targeted by this assay, and inadequate number of viral copies (<131 copies/mL). A negative result must be combined with clinical observations, patient history, and epidemiological information. The expected result is Negative. Fact Sheet for Patients:  PinkCheek.be Fact Sheet for Healthcare Providers:  GravelBags.it This test is not yet ap proved or cleared by the [place] FDA and  has been authorized for detection and/or diagnosis of SARS-CoV-2 by FDA under an Emergency Use Authorization (EUA). This EUA will remain  in effect (meaning this test can be used) for the duration of the COVID-19 declaration under Section 564(b)(1) of the Act, 21 U.S.C. section 360bbb-3(b)(1), unless the authorization is terminated or revoked sooner.    Influenza A by PCR NEGATIVE NEGATIVE Final   Influenza B by PCR NEGATIVE NEGATIVE Final    Comment: (NOTE) The Xpert Xpress SARS-CoV-2/FLU/RSV assay  is intended as  an aid in  the diagnosis of influenza from Nasopharyngeal swab specimens and  should not be used as a sole basis for treatment. Nasal washings and  aspirates are unacceptable for Xpert Xpress SARS-CoV-2/FLU/RSV  testing. Fact Sheet for Patients: https://www.moore.com/ Fact Sheet for Healthcare Providers: https://www.young.biz/ This test is not yet approved or cleared by the [place] FDA and  has been authorized for detection and/or diagnosis of SARS-CoV-2 by  FDA under an Emergency Use Authorization (EUA). This EUA will remain  in effect (meaning this test can be used) for the duration of the  Covid-19 declaration under Section 564(b)(1) of the Act, 21  U.S.C. section 360bbb-3(b)(1), unless the authorization is  terminated or revoked. Performed at [facility], [address], [place], [place] [address]   MRSA PCR Screening     Status: None   Collection Time: May 11, 2019 11:03 AM   Specimen: Nasopharyngeal  Result Value Ref Range Status   MRSA by PCR NEGATIVE NEGATIVE Final    Comment:        The GeneXpert MRSA Assay (FDA approved for NASAL specimens only), is one component of a comprehensive MRSA colonization surveillance program. It is not intended to diagnose MRSA infection nor to guide or monitor treatment for MRSA infections. Performed at [facility], [address] [place], [place] [address]     Anti-infectives:  Anti-infectives (From admission, onward)   Start     Dose/Rate Route Frequency Ordered Stop   05/11/2019 1400  ceFAZolin (ANCEF) IVPB 1 g/50 mL premix     1 g 100 mL/hr over 30 Minutes Intravenous Every 8 hours 2019/05/11 1252 04/28/19 0651   05-11-2019 0916  bacitracin 50,000 Units in sodium chloride 0.9 % 500 mL irrigation  Status:  Discontinued       As needed 05/11/2019 0916 05/11/19 1044      Best Practice/Protocols:  VTE Prophylaxis: Mechanical Continous Sedation  Consults: Treatment Team:   Tressie Stalker, MD    Studies:    Events:  Subjective:    Overnight Issues:   Objective:  Vital signs for last 24 hours: Temp:  [98.3 F (36.8 C)-100.5 F (38.1 C)] 100.5 F (38.1 C) (04/06 0800) Pulse Rate:  [76-118] 100 (04/06 0700) Resp:  [13-30] 28 (04/06 0700) BP: (124-154)/(70-98) 124/79 (04/06 0700) SpO2:  [93 %-100 %] 100 % (04/06 0740) FiO2 (%):  [30 %] 30 % (04/06 0740) Weight:  [47.5 kg] 47.5 kg (04/06 0500)  Hemodynamic parameters for last 24 hours:    Intake/Output from previous day: 04/05 0701 - 04/06 0700 In: 2494.6 [I.V.:870; IO/XB:3532.9; IV Piggyback:109.3] Out: 2950 [Urine:2950]  Intake/Output this shift: No intake/output data recorded.  Vent settings for last 24 hours: Vent Mode: PRVC FiO2 (%):  [30 %] 30 % Set Rate:  [16 bmp] 16 bmp Vt Set:  [370 mL] 370 mL PEEP:  [5 cmH20] 5 cmH20 Pressure Support:  [8 cmH20-10 cmH20] 8 cmH20 Plateau Pressure:  [16 cmH20-21 cmH20] 21 cmH20  Physical Exam:  General: on vent Neuro: both pupils fixed and dilated, decerebrate posturing LUE, min decorticate RUE HEENT/Neck: ETT Resp: coarse B CVS: RRR GI: soft, NT\ Extremities: no edema, no erythema, pulses WNL  Results for orders placed or performed during the hospital encounter of 2019-05-11 (from the past 24 hour(s))  Glucose, capillary     Status: Abnormal   Collection Time: 05/05/19 11:28 AM  Result Value Ref Range   Glucose-Capillary 104 (H) 70 - 99 mg/dL  Glucose, capillary     Status: Abnormal   Collection Time: 05/05/19  3:29 PM  Result Value Ref Range   Glucose-Capillary 124 (H) 70 - 99 mg/dL   Comment 1 Notify RN    Comment 2 Document in Chart   Glucose, capillary     Status: Abnormal   Collection Time: 05/05/19  8:02 PM  Result Value Ref Range   Glucose-Capillary 128 (H) 70 - 99 mg/dL  Glucose, capillary     Status: Abnormal   Collection Time: 05/05/19 11:37 PM  Result Value Ref Range   Glucose-Capillary 123 (H) 70 - 99 mg/dL   Glucose, capillary     Status: Abnormal   Collection Time: 05/06/19  3:39 AM  Result Value Ref Range   Glucose-Capillary 126 (H) 70 - 99 mg/dL  Magnesium     Status: None   Collection Time: 05/06/19  5:00 AM  Result Value Ref Range   Magnesium 2.2 1.7 - 2.4 mg/dL  Phosphorus     Status: None   Collection Time: 05/06/19  5:00 AM  Result Value Ref Range   Phosphorus 4.5 2.5 - 4.6 mg/dL  CBC     Status: Abnormal   Collection Time: 05/06/19  5:00 AM  Result Value Ref Range   WBC 16.1 (H) 4.0 - 10.5 K/uL   RBC 2.92 (L) 3.87 - 5.11 MIL/uL   Hemoglobin 8.9 (L) 12.0 - 15.0 g/dL   HCT 01.7 (L) 49.4 - 49.6 %   MCV 98.3 80.0 - 100.0 fL   MCH 30.5 26.0 - 34.0 pg   MCHC 31.0 30.0 - 36.0 g/dL   RDW 75.9 (H) 16.3 - 84.6 %   Platelets 435 (H) 150 - 400 K/uL   nRBC 0.6 (H) 0.0 - 0.2 %  Basic metabolic panel     Status: Abnormal   Collection Time: 05/06/19  5:00 AM  Result Value Ref Range   Sodium 145 135 - 145 mmol/L   Potassium 3.7 3.5 - 5.1 mmol/L   Chloride 107 98 - 111 mmol/L   CO2 26 22 - 32 mmol/L   Glucose, Bld 135 (H) 70 - 99 mg/dL   BUN 11 6 - 20 mg/dL   Creatinine, Ser 6.59 (L) 0.44 - 1.00 mg/dL   Calcium 8.8 (L) 8.9 - 10.3 mg/dL   GFR calc non Af Amer >60 >60 mL/min   GFR calc Af Amer >60 >60 mL/min   Anion gap 12 5 - 15  Glucose, capillary     Status: Abnormal   Collection Time: 05/06/19  8:02 AM  Result Value Ref Range   Glucose-Capillary 108 (H) 70 - 99 mg/dL   Comment 1 Notify RN    Comment 2 Document in Chart     Assessment & Plan: Present on Admission: . Epidural hematoma (HCC) . Traumatic epidural hematoma with loss of consciousness greater than 24 hours without return to pre-existing conscious level with patient surviving (HCC)    LOS: 9 days   Additional comments:I reviewed the patient's new clinical lab test results. . Fall/ETOH  L EDH with shift, bifrontal and bitemporal hemorrhagic contusions- NSGY c/s (D/r Lovell Sheehan), s/p emergent L craniotomy  3/28.Worsened head CT 4/1, NS placed ventriculostomy. Ventric stopped draining and was removed 4/4. Has uncal herniation and grave prognosis. Now both pupils blown, worse exam. I D/W Dr. Lovell Sheehan on the unit. VDRF-does some PSV weaning, neuro status too poor to extubate EtOH abuse - CIWA FEN - cortrak, TF, K better DVT - SCDs for  now Dispo - ICU, Palliative consult. Continued neurologic decline despite maximal efforts. Agree with NS recommendation to transition to comfort care. Meeting with family and Palliative Care at 1200 today. Critical Care Total Time*: 38 Minutes  Violeta Gelinas, MD, MPH, FACS Trauma & General Surgery Use [email or web address] to contact on call provider  05/06/2019  *Care during the described time interval was provided by me. I have reviewed this patient's available data, including medical history, events of note, physical examination and test results as part of my evaluation.

## 2019-05-06 NOTE — Progress Notes (Signed)
Subjective: The patient is comatose and intubated.  She is in no apparent distress.  Objective: Vital signs in last 24 hours: Temp:  [97.9 F (36.6 C)-99.2 F (37.3 C)] 98.3 F (36.8 C) (04/06 0400) Pulse Rate:  [76-118] 100 (04/06 0700) Resp:  [13-30] 28 (04/06 0700) BP: (124-154)/(70-98) 124/79 (04/06 0700) SpO2:  [93 %-100 %] 94 % (04/06 0700) FiO2 (%):  [30 %] 30 % (04/06 0600) Weight:  [47.5 kg] 47.5 kg (04/06 0500) Estimated body mass index is 20.45 kg/m as calculated from the following:   Height as of this encounter: 5' (1.524 m).   Weight as of this encounter: 47.5 kg.   Intake/Output from previous day: 04/05 0701 - 04/06 0700 In: 2494.6 [I.V.:870; AQ/TM:2263.3; IV Piggyback:109.3] Out: 2950 [Urine:2950] Intake/Output this shift: No intake/output data recorded.  Physical exam Glasgow Coma Scale 5, E1M3V1.  Both her pupils are approximately 6 mm and nonreactive.  She decerebrate postures on the right and extensor postures on the left.  Lab Results: Recent Labs    05/05/19 0500 05/06/19 0500  WBC 11.4* 16.1*  HGB 8.2* 8.9*  HCT 27.0* 28.7*  PLT 345 435*   BMET Recent Labs    05/05/19 0500 05/06/19 0500  NA 149* 145  K 3.2* 3.7  CL 113* 107  CO2 27 26  GLUCOSE 123* 135*  BUN 13 11  CREATININE 0.31* 0.37*  CALCIUM 8.5* 8.8*    Studies/Results: No results found.  Assessment/Plan: Traumatic brain injury: The patient's prognosis for a meaningful recovery is extremely unlikely.  I would recommend withdrawal of support and comfort care measures.  Palliative medicine is going to discuss this with the patient's family.  LOS: 9 days     Cristi Loron 05/06/2019, 7:43 AM

## 2019-05-06 NOTE — Progress Notes (Signed)
Patient ID: Susan Mcconnell, female   DOB: 06-01-1975, 44 y.o.   MRN: [id number] I met with the Palliative Care team along with Chrishana's son, sister, and sister's husband.  A gentleman who is in the process of trying to adopt Tanita's 2 other children, who are in the custody of DSS, also joined the meeting.  I outlined her clinical course and ongoing neurologic deterioration despite maximal efforts.  I let them know that Dr. Arnoldo Morale and I both do not feel she will regain any meaningful neurologic recovery.  In fact, she continues to worsen on a daily basis.  I advised them with her current herniation that it would not be appropriate to proceed with tracheostomy and PEG tube placement.  They seemed to have some misunderstanding that those were therapeutic procedures.  I outlined that they would be done purely for ongoing long-term support if she were showing signs of neurologic recovery.  That is not the case.  I told them that Dr. Arnoldo Morale and I are in agreement that they should proceed with transition to comfort care so she may have a natural death.  They would like to take some time to speak with other family members.  Additionally, I asked regarding code status and they would like to continue full code for now as they discussed things.  Nursing is arranging a video visit with her 79 year old son today.  We will continue aggressive supportive care in the interim.  Georganna Skeans, MD, MPH, FACS Please use [email or web address] to contact on call provider

## 2019-05-07 ENCOUNTER — Inpatient Hospital Stay (HOSPITAL_COMMUNITY): Payer: Medicaid Other

## 2019-05-07 LAB — CBC
HCT: 30.1 % — ABNORMAL LOW (ref 36.0–46.0)
Hemoglobin: 9 g/dL — ABNORMAL LOW (ref 12.0–15.0)
MCH: 30 pg (ref 26.0–34.0)
MCHC: 29.9 g/dL — ABNORMAL LOW (ref 30.0–36.0)
MCV: 100.3 fL — ABNORMAL HIGH (ref 80.0–100.0)
Platelets: 486 10*3/uL — ABNORMAL HIGH (ref 150–400)
RBC: 3 MIL/uL — ABNORMAL LOW (ref 3.87–5.11)
RDW: 19.4 % — ABNORMAL HIGH (ref 11.5–15.5)
WBC: 24.7 10*3/uL — ABNORMAL HIGH (ref 4.0–10.5)
nRBC: 0.2 % (ref 0.0–0.2)

## 2019-05-07 LAB — URINALYSIS, ROUTINE W REFLEX MICROSCOPIC
Bilirubin Urine: NEGATIVE
Glucose, UA: NEGATIVE mg/dL
Hgb urine dipstick: NEGATIVE
Ketones, ur: NEGATIVE mg/dL
Nitrite: NEGATIVE
Protein, ur: NEGATIVE mg/dL
Specific Gravity, Urine: 1.008 (ref 1.005–1.030)
pH: 8 (ref 5.0–8.0)

## 2019-05-07 LAB — GLUCOSE, CAPILLARY
Glucose-Capillary: 103 mg/dL — ABNORMAL HIGH (ref 70–99)
Glucose-Capillary: 106 mg/dL — ABNORMAL HIGH (ref 70–99)
Glucose-Capillary: 107 mg/dL — ABNORMAL HIGH (ref 70–99)
Glucose-Capillary: 180 mg/dL — ABNORMAL HIGH (ref 70–99)
Glucose-Capillary: 96 mg/dL (ref 70–99)
Glucose-Capillary: 97 mg/dL (ref 70–99)

## 2019-05-07 LAB — BASIC METABOLIC PANEL
Anion gap: 11 (ref 5–15)
BUN: 14 mg/dL (ref 6–20)
CO2: 24 mmol/L (ref 22–32)
Calcium: 9.2 mg/dL (ref 8.9–10.3)
Chloride: 111 mmol/L (ref 98–111)
Creatinine, Ser: 0.55 mg/dL (ref 0.44–1.00)
GFR calc Af Amer: >60 mL/min (ref >60)
GFR calc non Af Amer: >60 mL/min (ref >60)
Glucose, Bld: 149 mg/dL — ABNORMAL HIGH (ref 70–99)
Potassium: 4.2 mmol/L (ref 3.5–5.1)
Sodium: 146 mmol/L — ABNORMAL HIGH (ref 135–145)

## 2019-05-07 LAB — PHOSPHORUS: Phosphorus: 5.2 mg/dL — ABNORMAL HIGH (ref 2.5–4.6)

## 2019-05-07 LAB — MAGNESIUM: Magnesium: 2.5 mg/dL — ABNORMAL HIGH (ref 1.7–2.4)

## 2019-05-07 MED ORDER — VANCOMYCIN HCL IN DEXTROSE 1-5 GM/200ML-% IV SOLN
1000.0000 mg | Freq: Once | INTRAVENOUS | Status: AC
  Administered 2019-05-07: 1000 mg via INTRAVENOUS
  Filled 2019-05-07: qty 200

## 2019-05-07 MED ORDER — METOPROLOL TARTRATE 5 MG/5ML IV SOLN
INTRAVENOUS | Status: AC
  Administered 2019-05-07: 10 mg
  Filled 2019-05-07: qty 10

## 2019-05-07 MED ORDER — SODIUM CHLORIDE 0.9 % IV SOLN
2.0000 g | Freq: Three times a day (TID) | INTRAVENOUS | Status: DC
  Administered 2019-05-07 – 2019-05-08 (×3): 2 g via INTRAVENOUS
  Filled 2019-05-07 (×5): qty 2

## 2019-05-07 MED ORDER — METOPROLOL TARTRATE 5 MG/5ML IV SOLN
10.0000 mg | Freq: Once | INTRAVENOUS | Status: DC
  Filled 2019-05-07: qty 10

## 2019-05-07 MED ORDER — METOPROLOL TARTRATE 5 MG/5ML IV SOLN
5.0000 mg | Freq: Four times a day (QID) | INTRAVENOUS | Status: DC | PRN
  Administered 2019-05-07: 5 mg via INTRAVENOUS
  Filled 2019-05-07: qty 5

## 2019-05-07 MED ORDER — NOREPINEPHRINE 4 MG/250ML-% IV SOLN
INTRAVENOUS | Status: AC
  Administered 2019-05-07: 20 ug/min via INTRAVENOUS
  Filled 2019-05-07: qty 250

## 2019-05-07 MED ORDER — ALBUMIN HUMAN 5 % IV SOLN
25.0000 g | Freq: Once | INTRAVENOUS | Status: AC
  Administered 2019-05-07: 25 g via INTRAVENOUS

## 2019-05-07 MED ORDER — ALBUMIN HUMAN 5 % IV SOLN
INTRAVENOUS | Status: AC
  Filled 2019-05-07: qty 500

## 2019-05-07 MED ORDER — NOREPINEPHRINE 4 MG/250ML-% IV SOLN
0.0000 ug/min | INTRAVENOUS | Status: DC
  Administered 2019-05-08: 20 ug/min via INTRAVENOUS
  Administered 2019-05-08: 08:00:00 35 ug/min via INTRAVENOUS
  Administered 2019-05-08: 32 ug/min via INTRAVENOUS
  Filled 2019-05-07: qty 500
  Filled 2019-05-07 (×2): qty 250

## 2019-05-07 MED ORDER — VANCOMYCIN HCL IN DEXTROSE 750-5 MG/150ML-% IV SOLN
750.0000 mg | Freq: Two times a day (BID) | INTRAVENOUS | Status: DC
  Administered 2019-05-08: 750 mg via INTRAVENOUS
  Filled 2019-05-07 (×2): qty 150

## 2019-05-07 NOTE — Progress Notes (Signed)
Patient becoming more tachycardic, sustaining 180-190's. Trauma notified, new orders carried out. Dicie Beam RN BSN.

## 2019-05-07 NOTE — Progress Notes (Signed)
Trauma/Critical Care Follow Up Note  Subjective:    Overnight Issues:   Objective:  Vital signs for last 24 hours: Temp:  [98.7 F (37.1 C)-101.5 F (38.6 C)] 99.7 F (37.6 C) (04/07 0800) Pulse Rate:  [98-156] 120 (04/07 0800) Resp:  [16-30] 19 (04/07 0800) BP: (91-144)/(67-93) 124/83 (04/07 0800) SpO2:  [89 %-100 %] 100 % (04/07 0800) FiO2 (%):  [30 %] 30 % (04/07 0800) Weight:  [47.6 kg] 47.6 kg (04/07 0500)  Hemodynamic parameters for last 24 hours:    Intake/Output from previous day: 04/06 0701 - 04/07 0700 In: 2539.8 [I.V.:1619.8; NG/GT:920] Out: 3875 [Urine:3875]  Intake/Output this shift: Total I/O In: 185.3 [I.V.:105.3; NG/GT:80] Out: 350 [Urine:350]  Vent settings for last 24 hours: Vent Mode: PRVC FiO2 (%):  [30 %] 30 % Set Rate:  [16 bmp] 16 bmp Vt Set:  [370 mL] 370 mL PEEP:  [5 cmH20] 5 cmH20 Plateau Pressure:  [13 cmH20-20 cmH20] 20 cmH20  Physical Exam:  Gen: comfortable, no distress Neuro: does not follow commands, muscle contraction at the ankle bilaterally with proximal noxious stimulus on the L and distal noxious stimulus on the right. Pupils dilated and unreactive. +cough/gag and breathes over the vent HEENT: intubated Neck: supple CV: tachycardic-stable Pulm: unlabored breathing, mechanically ventilated Abd: soft, nontender GU: clear, yellow urine Extr: wwp, no edema   Results for orders placed or performed during the hospital encounter of 04/02/2019 (from the past 24 hour(s))  Glucose, capillary     Status: Abnormal   Collection Time: 05/06/19 11:25 AM  Result Value Ref Range   Glucose-Capillary 105 (H) 70 - 99 mg/dL   Comment 1 Notify RN    Comment 2 Document in Chart   Glucose, capillary     Status: None   Collection Time: 05/06/19  3:41 PM  Result Value Ref Range   Glucose-Capillary 90 70 - 99 mg/dL   Comment 1 Notify RN    Comment 2 Document in Chart   Glucose, capillary     Status: Abnormal   Collection Time: 05/06/19   7:51 PM  Result Value Ref Range   Glucose-Capillary 102 (H) 70 - 99 mg/dL  Glucose, capillary     Status: Abnormal   Collection Time: 05/06/19 11:30 PM  Result Value Ref Range   Glucose-Capillary 113 (H) 70 - 99 mg/dL  Glucose, capillary     Status: None   Collection Time: 05/07/19  3:38 AM  Result Value Ref Range   Glucose-Capillary 96 70 - 99 mg/dL  Magnesium     Status: Abnormal   Collection Time: 05/07/19  5:00 AM  Result Value Ref Range   Magnesium 2.5 (H) 1.7 - 2.4 mg/dL  Phosphorus     Status: Abnormal   Collection Time: 05/07/19  5:00 AM  Result Value Ref Range   Phosphorus 5.2 (H) 2.5 - 4.6 mg/dL  CBC     Status: Abnormal   Collection Time: 05/07/19  5:00 AM  Result Value Ref Range   WBC 24.7 (H) 4.0 - 10.5 K/uL   RBC 3.00 (L) 3.87 - 5.11 MIL/uL   Hemoglobin 9.0 (L) 12.0 - 15.0 g/dL   HCT 30.1 (L) 36.0 - 46.0 %   MCV 100.3 (H) 80.0 - 100.0 fL   MCH 30.0 26.0 - 34.0 pg   MCHC 29.9 (L) 30.0 - 36.0 g/dL   RDW 19.4 (H) 11.5 - 15.5 %   Platelets 486 (H) 150 - 400 K/uL   nRBC 0.2 0.0 - 0.2 %  Basic metabolic panel     Status: Abnormal   Collection Time: 05/07/19  5:00 AM  Result Value Ref Range   Sodium 146 (H) 135 - 145 mmol/L   Potassium 4.2 3.5 - 5.1 mmol/L   Chloride 111 98 - 111 mmol/L   CO2 24 22 - 32 mmol/L   Glucose, Bld 149 (H) 70 - 99 mg/dL   BUN 14 6 - 20 mg/dL   Creatinine, Ser 1.24 0.44 - 1.00 mg/dL   Calcium 9.2 8.9 - 58.0 mg/dL   GFR calc non Af Amer >60 >60 mL/min   GFR calc Af Amer >60 >60 mL/min   Anion gap 11 5 - 15  Glucose, capillary     Status: Abnormal   Collection Time: 05/07/19  8:14 AM  Result Value Ref Range   Glucose-Capillary 106 (H) 70 - 99 mg/dL   Comment 1 Notify RN    Comment 2 Document in Chart     Assessment & Plan: The plan of care was discussed with the bedside nurse for the day, Ellie, who is in agreement with this plan and no additional concerns were raised.   Present on Admission: . Epidural hematoma (HCC) .  Traumatic epidural hematoma with loss of consciousness greater than 24 hours without return to pre-existing conscious level with patient surviving (HCC)    LOS: 10 days   Additional comments:I reviewed the patient's new clinical lab test results.    Fall/ETOH  L EDH with shift, bifrontal and bitemporal hemorrhagic contusions- NSGY c/s (D/r Lovell Sheehan), s/p emergent L craniotomy 3/28.Worsened head CT 4/1, NS placed ventriculostomy. Ventric stopped draining and was removed 4/4. Has uncal herniation and grave prognosis. Now both pupils blown, worse exam. Discussed again with Dr. Lovell Sheehan today. VDRF-does some PSV weaning, neuro status too poor to extubate EtOH abuse - CIWA FEN - cortrak, TF, K better ID - Tmax 101.5, WBC 24, pan cx today DVT - SCDs Dispo - ICU, Palliative on board.Continued neurologic decline despite maximal efforts. Agree with NSGY recommendation to transition to comfort care. Will discuss again with family today.   Critical Care Total Time: 40 minutes  Diamantina Monks, MD Trauma & General Surgery Please use [email or web address] to contact on call provider  05/07/2019  *Care during the described time interval was provided by me. I have reviewed this patient's available data, including medical history, events of note, physical examination and test results as part of my evaluation.

## 2019-05-07 NOTE — Progress Notes (Signed)
Pharmacy Antibiotic Note  Susan Mcconnell is a 44 y.o. female admitted on 04/28/2019 after a fall with a wound to the head s/p ventriculostomy which was removed on 4/4 who now has worsening leukocytosis and a fever concerning for sepsis.  Pharmacy has been consulted for Vancomycin and Cefepime dosing. Cultures were obtained today.  SCr 0.55- stable for admission. Good urine output.    Plan: Cefepime 2g IV every 8 hours.  Vancomycin 1000mg  IV x1, then 750mg  IV every 12 hours Goal AUC 400-550. Expected AUC: 522 SCr used: 0.55 Follow-up renal function, culture results, and clinical status.    Height: 5' (152.4 cm) Weight: 47.6 kg (104 lb 15 oz) IBW/kg (Calculated) : 45.5  Temp (24hrs), Avg:100 F (37.8 C), Min:98.7 F (37.1 C), Max:101.5 F (38.6 C)  Recent Labs  Lab 05/03/19 0500 05/04/19 0500 05/05/19 0500 05/06/19 0500 05/07/19 0500  WBC 8.7 9.0 11.4* 16.1* 24.7*  CREATININE 0.50 <0.30* 0.31* 0.37* 0.55    Estimated Creatinine Clearance: 65.8 mL/min (by C-G formula based on SCr of 0.55 mg/dL).    No Known Allergies  Antimicrobials this admission: Vancomycin 4/7 >> Cefepime 4/7 >>  Dose adjustments this admission:   Microbiology results: 4/7 Respiratory culture >> 4/7 Blood culture >> 3/28 COVID/Flu PCR negative 3/28 MRSA PCR negative  Thank you for allowing pharmacy to be a part of this patient's care.  4/28, PharmD, BCPS, BCCCP Clinical Pharmacist Please refer to [facility] for [facility] numbers 05/07/2019 5:07 PM

## 2019-05-07 NOTE — Progress Notes (Signed)
Subjective: The patient is intubated, sedated on fentanyl, and comatose.  She is in no apparent distress.  Objective: Vital signs in last 24 hours: Temp:  [98.7 F (37.1 C)-101.5 F (38.6 C)] 99.7 F (37.6 C) (04/07 0800) Pulse Rate:  [98-156] 120 (04/07 0800) Resp:  [16-30] 19 (04/07 0800) BP: (91-144)/(67-93) 124/83 (04/07 0800) SpO2:  [89 %-100 %] 100 % (04/07 0800) FiO2 (%):  [30 %] 30 % (04/07 0800) Weight:  [47.6 kg] 47.6 kg (04/07 0500) Estimated body mass index is 20.49 kg/m as calculated from the following:   Height as of this encounter: 5' (1.524 m).   Weight as of this encounter: 47.6 kg.   Intake/Output from previous day: 04/06 0701 - 04/07 0700 In: 2539.8 [I.V.:1619.8; NG/GT:920] Out: 3875 [Urine:3875] Intake/Output this shift: Total I/O In: 185.3 [I.V.:105.3; NG/GT:80] Out: 350 [Urine:350]  Physical exam Glasgow Coma Scale 3, intubated.  Her pupils are approximately 5 mm on the left and 6 mm on the right.  Neither reacts.  The patient has present bilateral corneal reflexes and a cough reflex.  Lab Results: Recent Labs    05/06/19 0500 05/07/19 0500  WBC 16.1* 24.7*  HGB 8.9* 9.0*  HCT 28.7* 30.1*  PLT 435* 486*   BMET Recent Labs    05/06/19 0500 05/07/19 0500  NA 145 146*  K 3.7 4.2  CL 107 111  CO2 26 24  GLUCOSE 135* 149*  BUN 11 14  CREATININE 0.37* 0.55  CALCIUM 8.8* 9.2    Studies/Results: No results found.  Assessment/Plan: Traumatic brain injury: The patient is progressively worsening.  She is not brain dead yet.  The chances for meaningful survival are essentially 0.  I would again recommend comfort care and withdrawal of support.  This has been discussed with the family.  LOS: 10 days     Cristi Loron 05/07/2019, 10:21 AM

## 2019-05-07 NOTE — Progress Notes (Signed)
Patient ID: Betsy Rosello, female   DOB: April 25, 1975, 44 y.o.   MRN: [id number]  This NP visited patient at the bedside as a follow up for palliative medicine needs and emotional support.  I received a call from a Atash/son requesting that his 2 siblings have permission to visit their mother before patient is liberated from the vent and shifted to a full comfort path.  Again we discussed patient's current medical situation, limited viable treatment options to prolong quality of life and grim prognosis.  Motional support offered  I spoke with Orthoptist with the 2 siblings to clarify if indeed this was what family was requesting.  She verifies that yes, Mickel Crow understands that his mother will not recover from this traumatic brain injury, and that family is requesting for all 3 children to be present prior to terminal wean.  Updated case manager on current medical situation, limited viable treatment options and grim prognosis.  Trula Ore tells me that she will speak to the family and update unit with a specific time that family wishes to come to the bedside  tomorrow.  Await callback and information.  Questions and concerns addressed     Discussed with Elle RN  Total time spent on the unit was 25 minutes  Greater than 50% of the time was spent in counseling and coordination of care  Lorinda Creed NP  Palliative Medicine Team Team Phone # [phone] Pager [phone]

## 2019-05-08 ENCOUNTER — Encounter (HOSPITAL_COMMUNITY): Payer: Self-pay | Admitting: Emergency Medicine

## 2019-05-08 DIAGNOSIS — J9611 Chronic respiratory failure with hypoxia: Secondary | ICD-10-CM

## 2019-05-08 LAB — CBC
HCT: 28.6 % — ABNORMAL LOW (ref 36.0–46.0)
Hemoglobin: 8.2 g/dL — ABNORMAL LOW (ref 12.0–15.0)
MCH: 30 pg (ref 26.0–34.0)
MCHC: 28.7 g/dL — ABNORMAL LOW (ref 30.0–36.0)
MCV: 104.8 fL — ABNORMAL HIGH (ref 80.0–100.0)
Platelets: 451 10*3/uL — ABNORMAL HIGH (ref 150–400)
RBC: 2.73 MIL/uL — ABNORMAL LOW (ref 3.87–5.11)
RDW: 19.2 % — ABNORMAL HIGH (ref 11.5–15.5)
WBC: 16.4 10*3/uL — ABNORMAL HIGH (ref 4.0–10.5)
nRBC: 0.1 % (ref 0.0–0.2)

## 2019-05-08 LAB — GLUCOSE, CAPILLARY
Glucose-Capillary: 133 mg/dL — ABNORMAL HIGH (ref 70–99)
Glucose-Capillary: 129 mg/dL — ABNORMAL HIGH (ref 70–99)

## 2019-05-08 LAB — BASIC METABOLIC PANEL
Anion gap: 8 (ref 5–15)
BUN: 20 mg/dL (ref 6–20)
CO2: 19 mmol/L — ABNORMAL LOW (ref 22–32)
Calcium: 8.7 mg/dL — ABNORMAL LOW (ref 8.9–10.3)
Chloride: 128 mmol/L — ABNORMAL HIGH (ref 98–111)
Creatinine, Ser: 0.54 mg/dL (ref 0.44–1.00)
GFR calc Af Amer: >60 mL/min (ref >60)
GFR calc non Af Amer: >60 mL/min (ref >60)
Glucose, Bld: 214 mg/dL — ABNORMAL HIGH (ref 70–99)
Potassium: 4.1 mmol/L (ref 3.5–5.1)
Sodium: 155 mmol/L — ABNORMAL HIGH (ref 135–145)

## 2019-05-08 MED ORDER — POLYVINYL ALCOHOL 1.4 % OP SOLN
1.0000 [drp] | Freq: Four times a day (QID) | OPHTHALMIC | Status: DC | PRN
  Filled 2019-05-08: qty 15

## 2019-05-08 MED ORDER — DEXTROSE 5 % IV SOLN: INTRAVENOUS | Status: DC

## 2019-05-08 MED ORDER — GLYCOPYRROLATE 1 MG PO TABS: 1.0000 mg | ORAL_TABLET | ORAL | Status: DC | PRN

## 2019-05-08 MED ORDER — FENTANYL BOLUS VIA INFUSION
100.0000 ug | INTRAVENOUS | Status: DC | PRN
  Filled 2019-05-08: qty 100

## 2019-05-08 MED ORDER — ACETAMINOPHEN 650 MG RE SUPP: 650.0000 mg | Freq: Four times a day (QID) | RECTAL | Status: DC | PRN

## 2019-05-08 MED ORDER — FENTANYL CITRATE (PF) 100 MCG/2ML IJ SOLN: 50.0000 ug | INTRAMUSCULAR | Status: DC | PRN

## 2019-05-08 MED ORDER — SODIUM CHLORIDE 0.9 % IV SOLN: Freq: Once | INTRAVENOUS | Status: AC

## 2019-05-08 MED ORDER — DIPHENHYDRAMINE HCL 50 MG/ML IJ SOLN: 25.0000 mg | INTRAMUSCULAR | Status: DC | PRN

## 2019-05-08 MED ORDER — FENTANYL 2500MCG IN NS 250ML (10MCG/ML) PREMIX INFUSION: 0.0000 ug/h | INTRAVENOUS | Status: DC

## 2019-05-08 MED ORDER — MIDAZOLAM HCL 2 MG/2ML IJ SOLN: 2.0000 mg | INTRAMUSCULAR | Status: DC | PRN

## 2019-05-08 MED ORDER — ACETAMINOPHEN 325 MG PO TABS: 650.0000 mg | ORAL_TABLET | Freq: Four times a day (QID) | ORAL | Status: DC | PRN

## 2019-05-08 MED ORDER — GLYCOPYRROLATE 0.2 MG/ML IJ SOLN: 0.2000 mg | INTRAMUSCULAR | Status: DC | PRN

## 2019-05-09 LAB — CULTURE, RESPIRATORY W GRAM STAIN

## 2019-05-12 LAB — CULTURE, BLOOD (ROUTINE X 2)
Culture: NO GROWTH
Culture: NO GROWTH
Special Requests: ADEQUATE
Special Requests: ADEQUATE

## 2019-05-31 NOTE — Progress Notes (Signed)
Subjective: The patient is intubated and comatose.  She is in no apparent distress.  She is on Levophed.  She is not sedated.  Objective: Vital signs in last 24 hours: Temp:  [95.3 F (35.2 C)-101.5 F (38.6 C)] 97.3 F (36.3 C) (04/08 0800) Pulse Rate:  [92-155] 130 (04/08 1030) Resp:  [16-21] 16 (04/08 1030) BP: (41-128)/(28-90) 101/68 (04/08 1030) SpO2:  [88 %-100 %] 96 % (04/08 1030) FiO2 (%):  [30 %-70 %] 70 % (04/08 0800) Estimated body mass index is 20.49 kg/m as calculated from the following:   Height as of this encounter: 5' (1.524 m).   Weight as of this encounter: 47.6 kg.   Intake/Output from previous day: 04/07 0701 - 04/08 0700 In: 3418.9 [I.V.:1163.4; NG/GT:903; IV Piggyback:1352.4] Out: 3750 [Urine:3750] Intake/Output this shift: Total I/O In: 658.9 [I.V.:373.9; NG/GT:285] Out: -   Physical exam Glasgow Coma Scale 3 intubated.  Her pupils are approximately 4 mm on the left, 5 mm on the right, neither reacts.  She has no cough, she is not breathing over the ventilator, there is no corneal reflexes.  Lab Results: Recent Labs    05/07/19 0500 05/25/19 0500  WBC 24.7* 16.4*  HGB 9.0* 8.2*  HCT 30.1* 28.6*  PLT 486* 451*   BMET Recent Labs    05/07/19 0500 2019/05/25 0500  NA 146* 155*  K 4.2 4.1  CL 111 128*  CO2 24 19*  GLUCOSE 149* 214*  BUN 14 20  CREATININE 0.55 0.54  CALCIUM 9.2 8.7*    Studies/Results: DG Chest Port 1 View  Result Date: 05/07/2019 CLINICAL DATA:  Respiratory failure EXAM: PORTABLE CHEST 1 VIEW COMPARISON:  05/02/2019 FINDINGS: Endotracheal tube and feeding catheter are noted in satisfactory position. PICC line on the right is noted in the mid superior vena cava. The lungs are clear. No acute bony abnormality is noted. IMPRESSION: No acute abnormality noted. Electronically Signed   By: Alcide Clever M.D.   On: 05/07/2019 11:57    Assessment/Plan: Traumatic brain injury, epidural hematoma, cerebral contusions: There is no  clinical signs of brain activity and the patient is likely brain dead.  The plan is to extubate her after the family has arrived.  LOS: 11 days     Susan Mcconnell 05/25/2019, 10:39 AM

## 2019-05-31 NOTE — Progress Notes (Signed)
Patient ID: Susan Mcconnell, female   DOB: 03-12-1975, 44 y.o.   MRN: [id number]  This nurse practitioner spoke with Susan Mcconnell with CPS.   Plan is for all all 3 children of patient  Susan Mcconnell to come to the bedside at 11:00 with support from CPS.  It is my understanding that this is for visitation prior to terminal wean.  I am attempting to secure a [name] interpreter for that time.  Discussed with bedside nurse and hope is for attending to be present at that time.  No charge  Susan Creed NP  Palliative Medicine Team Team Phone # [phone] Pager [phone]

## 2019-05-31 NOTE — Progress Notes (Signed)
Palliative Medicine RN Note: Left message on secure line for interpreter services at [facility] requesting [name] interpreter, preferably Arti, for 1100 today. Also requested call back to our team at 1100 with confirmation.  Margret Chance Algenis Ballin, RN, BSN, [facility] Palliative Medicine Team 05/17/2019 7:59 AM Office [phone]

## 2019-05-31 NOTE — Progress Notes (Signed)
Palliative Medicine RN Note: Rec'd call from [employer]; confirmed interpreter at 1100 for 2 hours. Notified nursing station, front desk, and Lorinda Creed w PMT.  Margret Chance Ailea Rhatigan, RN, BSN, [facility] Palliative Medicine Team May 31, 2019 9:45 AM Office [phone]

## 2019-05-31 NOTE — Progress Notes (Addendum)
Patient ID: Susan Mcconnell, female   DOB: [birth year], 44 y.o.   MRN: [id number] Follow up - Trauma Critical Care  Patient Details:    Susan Mcconnell is an 44 y.o. female.  Lines/tubes : Airway (Active)  Secured at (cm) 24 cm 05/16/19 0738  Measured From Lips May 16, 2019 0738  Secured Location Left 2019-05-16 0738  Secured By [employer] May 16, 2019 0738  Tube Holder Repositioned Yes May 16, 2019 0738  Cuff Pressure (cm H2O) 28 cm H2O 2019/05/16 0738  Site Condition Dry 05-16-19 0738     PICC Double Lumen 05/03/19 PICC Right Brachial 32 cm 0 cm (Active)  Indication for Insertion or Continuance of Line Prolonged intravenous therapies 05/16/2019 0751  Exposed Catheter (cm) 0 cm 05/03/19 1200  Site Assessment Clean;Dry;Intact 05-16-19 0000  Lumen #1 Status Infusing 05/16/19 0000  Lumen #2 Status Infusing;In-line blood sampling system in place 05/16/19 0000  Dressing Type Transparent;Occlusive May 16, 2019 0000  Dressing Status Clean;Dry;Intact;Antimicrobial disc in place 05/16/19 0000  Line Care Connections checked and tightened 16-May-2019 0000  Line Adjustment (NICU/IV Team Only) No 05/03/19 1200  Dressing Intervention New dressing;Dressing changed;Antimicrobial disc changed;Securement device changed 05/06/19 1700  Dressing Change Due 05/13/19 05/07/19 1930     Urethral Catheter [name] 14 Fr. (Active)  Indication for Insertion or Continuance of Catheter Acute urinary retention (I&O Cath for 24 hrs prior to catheter insertion- Inpatient Only) 05/16/19 0400  Site Assessment Clean;Intact 16-May-2019 0751  Catheter Maintenance Bag below level of bladder;Catheter secured;Drainage bag/tubing not touching floor;Seal intact;No dependent loops;Insertion date on drainage bag 05/16/19 0751  Collection Container Standard drainage bag May 16, 2019 0751  Securement Method Securing device (Describe) 05-16-19 0751  Urinary Catheter Interventions (if applicable) Unclamped 16-May-2019 0400  Output (mL) 1100 mL May 16, 2019 0454     Microbiology/Sepsis markers: Results for orders placed or performed during the hospital encounter of 04/07/2019  Respiratory Panel by RT PCR (Flu A&B, Covid) - Nasopharyngeal Swab     Status: None   Collection Time: 04/18/2019  6:01 AM   Specimen: Nasopharyngeal Swab  Result Value Ref Range Status   SARS Coronavirus 2 by RT PCR NEGATIVE NEGATIVE Final    Comment: (NOTE) SARS-CoV-2 target nucleic acids are NOT DETECTED. The SARS-CoV-2 RNA is generally detectable in upper respiratoy specimens during the acute phase of infection. The lowest concentration of SARS-CoV-2 viral copies this assay can detect is 131 copies/mL. A negative result does not preclude SARS-Cov-2 infection and should not be used as the sole basis for treatment or other patient management decisions. A negative result may occur with  improper specimen collection/handling, submission of specimen other than nasopharyngeal swab, presence of viral mutation(s) within the areas targeted by this assay, and inadequate number of viral copies (<131 copies/mL). A negative result must be combined with clinical observations, patient history, and epidemiological information. The expected result is Negative. Fact Sheet for Patients:  https://www.moore.com/ Fact Sheet for Healthcare Providers:  https://www.young.biz/ This test is not yet ap proved or cleared by the [place] FDA and  has been authorized for detection and/or diagnosis of SARS-CoV-2 by FDA under an Emergency Use Authorization (EUA). This EUA will remain  in effect (meaning this test can be used) for the duration of the COVID-19 declaration under Section 564(b)(1) of the Act, 21 U.S.C. section 360bbb-3(b)(1), unless the authorization is terminated or revoked sooner.    Influenza A by PCR NEGATIVE NEGATIVE Final   Influenza B by PCR NEGATIVE NEGATIVE Final    Comment: (NOTE) The Xpert Xpress SARS-CoV-2/FLU/RSV assay is  intended as an aid in  the diagnosis of influenza from Nasopharyngeal swab specimens and  should not be used as a sole basis for treatment. Nasal washings and  aspirates are unacceptable for Xpert Xpress SARS-CoV-2/FLU/RSV  testing. Fact Sheet for Patients: https://www.moore.com/https://www.fda.gov/media/142436/download Fact Sheet for Healthcare Providers: https://www.young.biz/https://www.fda.gov/media/142435/download This test is not yet approved or cleared by the [place] FDA and  has been authorized for detection and/or diagnosis of SARS-CoV-2 by  FDA under an Emergency Use Authorization (EUA). This EUA will remain  in effect (meaning this test can be used) for the duration of the  Covid-19 declaration under Section 564(b)(1) of the Act, 21  U.S.C. section 360bbb-3(b)(1), unless the authorization is  terminated or revoked. Performed at [facility], [address] [place], [place] [address]   MRSA PCR Screening     Status: None   Collection Time: 06-07-19 11:03 AM   Specimen: Nasopharyngeal  Result Value Ref Range Status   MRSA by PCR NEGATIVE NEGATIVE Final    Comment:        The GeneXpert MRSA Assay (FDA approved for NASAL specimens only), is one component of a comprehensive MRSA colonization surveillance program. It is not intended to diagnose MRSA infection nor to guide or monitor treatment for MRSA infections. Performed at [facility], [address] [place], [place] [address]   Culture, respiratory (non-expectorated)     Status: None (Preliminary result)   Collection Time: 05/07/19 11:13 AM   Specimen: Tracheal Aspirate; Respiratory  Result Value Ref Range Status   Specimen Description TRACHEAL ASPIRATE  Final   Special Requests NONE  Final   Gram Stain   Final    ABUNDANT WBC PRESENT,BOTH PMN AND MONONUCLEAR ABUNDANT GRAM POSITIVE COCCI RARE GRAM NEGATIVE RODS Performed at [facility], [address] [place], [place] [address]    Culture ABUNDANT STAPHYLOCOCCUS AUREUS   Final   Report Status PENDING  Incomplete  Culture, blood (routine x 2)     Status: None (Preliminary result)   Collection Time: 05/07/19 11:36 AM   Specimen: BLOOD LEFT ARM  Result Value Ref Range Status   Specimen Description BLOOD LEFT ARM  Final   Special Requests   Final    BOTTLES DRAWN AEROBIC AND ANAEROBIC Blood Culture adequate volume   Culture   Final    NO GROWTH < 24 HOURS Performed at [facility], [address] [place], [place] [address]    Report Status PENDING  Incomplete  Culture, blood (routine x 2)     Status: None (Preliminary result)   Collection Time: 05/07/19 11:36 AM   Specimen: BLOOD LEFT ARM  Result Value Ref Range Status   Specimen Description BLOOD LEFT ARM  Final   Special Requests   Final    BOTTLES DRAWN AEROBIC AND ANAEROBIC Blood Culture adequate volume   Culture   Final    NO GROWTH < 24 HOURS Performed at [facility], [address] [place], [place] [address]    Report Status PENDING  Incomplete    Anti-infectives:  Anti-infectives (From admission, onward)   Start     Dose/Rate Route Frequency Ordered Stop   05/04/2019 0530  vancomycin (VANCOCIN) IVPB 750 mg/150 ml premix     750 mg 150 mL/hr over 60 Minutes Intravenous Every 12 hours 05/07/19 1719     05/07/19 1730  ceFEPIme (MAXIPIME) 2 g in sodium chloride 0.9 % 100 mL IVPB     2  g 200 mL/hr over 30 Minutes Intravenous Every 8 hours 05/07/19 1719     05/07/19 1730  vancomycin (VANCOCIN) IVPB 1000 mg/200 mL premix     1,000 mg 200 mL/hr over 60 Minutes Intravenous  Once 05/07/19 1719 05/07/19 1837   05/22/2019 1400  ceFAZolin (ANCEF) IVPB 1 g/50 mL premix     1 g 100 mL/hr over 30 Minutes Intravenous Every 8 hours May 22, 2019 1252 04/28/19 0651   2019-05-22 0916  bacitracin 50,000 Units in sodium chloride 0.9 % 500 mL irrigation  Status:  Discontinued       As needed 05/22/2019 0916 2019-05-22 1044      Best Practice/Protocols:  VTE Prophylaxis: Mechanical no sedation   Consults: Treatment Team:  Tressie Stalker, MD    Studies:    Events:  Subjective:    Overnight Issues:   Objective:  Vital signs for last 24 hours: Temp:  [95.3 F (35.2 C)-101.5 F (38.6 C)] 97.3 F (36.3 C) (04/08 0800) Pulse Rate:  [92-155] 105 (04/08 0745) Resp:  [16-21] 16 (04/08 0745) BP: (41-133)/(28-90) 41/28 (04/08 0745) SpO2:  [88 %-100 %] 88 % (04/08 0745) FiO2 (%):  [30 %-60 %] 60 % (04/08 0738)  Hemodynamic parameters for last 24 hours:    Intake/Output from previous day: 04/07 0701 - 04/08 0700 In: 3418.9 [I.V.:1163.4; NG/GT:903; IV Piggyback:1352.4] Out: 3750 [Urine:3750]  Intake/Output this shift: No intake/output data recorded.  Vent settings for last 24 hours: Vent Mode: PRVC FiO2 (%):  [30 %-60 %] 60 % Set Rate:  [16 bmp] 16 bmp Vt Set:  [370 mL] 370 mL PEEP:  [5 cmH20] 5 cmH20 Plateau Pressure:  [18 cmH20-21 cmH20] 21 cmH20  Physical Exam:  General: no respiratory distress Neuro: pupils fixed, minimal leg movement to pain HEENT/Neck: ETT Resp: few rhonchi CVS: RRR GI: soft, NT Extremities: no edema  Results for orders placed or performed during the hospital encounter of 05-22-2019 (from the past 24 hour(s))  Culture, respiratory (non-expectorated)     Status: None (Preliminary result)   Collection Time: 05/07/19 11:13 AM   Specimen: Tracheal Aspirate; Respiratory  Result Value Ref Range   Specimen Description TRACHEAL ASPIRATE    Special Requests NONE    Gram Stain      ABUNDANT WBC PRESENT,BOTH PMN AND MONONUCLEAR ABUNDANT GRAM POSITIVE COCCI RARE GRAM NEGATIVE RODS Performed at [facility], [address], [place], [place] [address]    Culture ABUNDANT STAPHYLOCOCCUS AUREUS    Report Status PENDING   Urinalysis, Routine w reflex microscopic     Status: Abnormal   Collection Time: 05/07/19 11:13 AM  Result Value Ref Range   Color, Urine STRAW (A) YELLOW   APPearance CLEAR CLEAR   Specific Gravity, Urine 1.008 1.005  - 1.030   pH 8.0 5.0 - 8.0   Glucose, UA NEGATIVE NEGATIVE mg/dL   Hgb urine dipstick NEGATIVE NEGATIVE   Bilirubin Urine NEGATIVE NEGATIVE   Ketones, ur NEGATIVE NEGATIVE mg/dL   Protein, ur NEGATIVE NEGATIVE mg/dL   Nitrite NEGATIVE NEGATIVE   Leukocytes,Ua MODERATE (A) NEGATIVE   RBC / HPF 6-10 0 - 5 RBC/hpf   WBC, UA 21-50 0 - 5 WBC/hpf   Bacteria, UA RARE (A) NONE SEEN   Squamous Epithelial / LPF 0-5 0 - 5  Culture, blood (routine x 2)     Status: None (Preliminary result)   Collection Time: 05/07/19 11:36 AM   Specimen: BLOOD LEFT ARM  Result Value Ref Range   Specimen Description BLOOD  LEFT ARM    Special Requests      BOTTLES DRAWN AEROBIC AND ANAEROBIC Blood Culture adequate volume   Culture      NO GROWTH < 24 HOURS Performed at [facility], [place] [address] [place], [place] [address]    Report Status PENDING   Culture, blood (routine x 2)     Status: None (Preliminary result)   Collection Time: 05/07/19 11:36 AM   Specimen: BLOOD LEFT ARM  Result Value Ref Range   Specimen Description BLOOD LEFT ARM    Special Requests      BOTTLES DRAWN AEROBIC AND ANAEROBIC Blood Culture adequate volume   Culture      NO GROWTH < 24 HOURS Performed at [facility], [place] [address] [place], [place] [address]    Report Status PENDING   Glucose, capillary     Status: None   Collection Time: 05/07/19 12:04 PM  Result Value Ref Range   Glucose-Capillary 97 70 - 99 mg/dL   Comment 1 Notify RN    Comment 2 Document in Chart   Glucose, capillary     Status: Abnormal   Collection Time: 05/07/19  3:50 PM  Result Value Ref Range   Glucose-Capillary 107 (H) 70 - 99 mg/dL   Comment 1 Notify RN    Comment 2 Document in Chart   Glucose, capillary     Status: Abnormal   Collection Time: 05/07/19  7:48 PM  Result Value Ref Range   Glucose-Capillary 103 (H) 70 - 99 mg/dL  Glucose, capillary     Status: Abnormal   Collection Time: 05/07/19 11:33 PM  Result Value Ref Range    Glucose-Capillary 180 (H) 70 - 99 mg/dL  Glucose, capillary     Status: Abnormal   Collection Time: 05/26/2019  3:29 AM  Result Value Ref Range   Glucose-Capillary 133 (H) 70 - 99 mg/dL  CBC     Status: Abnormal   Collection Time: 05/20/2019  5:00 AM  Result Value Ref Range   WBC 16.4 (H) 4.0 - 10.5 K/uL   RBC 2.73 (L) 3.87 - 5.11 MIL/uL   Hemoglobin 8.2 (L) 12.0 - 15.0 g/dL   HCT 28.6 (L) 36.0 - 46.0 %   MCV 104.8 (H) 80.0 - 100.0 fL   MCH 30.0 26.0 - 34.0 pg   MCHC 28.7 (L) 30.0 - 36.0 g/dL   RDW 19.2 (H) 11.5 - 15.5 %   Platelets 451 (H) 150 - 400 K/uL   nRBC 0.1 0.0 - 0.2 %  Basic metabolic panel     Status: Abnormal   Collection Time: 05/15/2019  5:00 AM  Result Value Ref Range   Sodium 155 (H) 135 - 145 mmol/L   Potassium 4.1 3.5 - 5.1 mmol/L   Chloride 128 (H) 98 - 111 mmol/L   CO2 19 (L) 22 - 32 mmol/L   Glucose, Bld 214 (H) 70 - 99 mg/dL   BUN 20 6 - 20 mg/dL   Creatinine, Ser 0.54 0.44 - 1.00 mg/dL   Calcium 8.7 (L) 8.9 - 10.3 mg/dL   GFR calc non Af Amer >60 >60 mL/min   GFR calc Af Amer >60 >60 mL/min   Anion gap 8 5 - 15  Glucose, capillary     Status: Abnormal   Collection Time: 05/13/2019  8:42 AM  Result Value Ref Range   Glucose-Capillary 129 (H) 70 - 99 mg/dL   Comment 1 Notify RN    Comment 2  Repeat Test     Assessment & Plan: Present on Admission: . Epidural hematoma (HCC) . Traumatic epidural hematoma with loss of consciousness greater than 24 hours without return to pre-existing conscious level with patient surviving (HCC)    LOS: 11 days   Additional comments:I reviewed the patient's new clinical lab test results. . Fall/ETOH  L EDH with shift, bifrontal and bitemporal hemorrhagic contusions- NSGY c/s (D/r Lovell Sheehan), s/p emergent L craniotomy 3/28.Worsened head CT 4/1, NS placed ventriculostomy. Ventric stopped draining and was removed 4/4. Has uncal herniation and grave prognosis. Now both pupils blown, exam worse daily. VDRF-neuro status too  poor to extubate EtOH abuse - CIWA FEN - cortrak, TF. Noted hypernatremia - hold off on TX with terminal extubation planned. ID - WBC down some on vanc/maxipime empiric, pan cx P, requiring levophed DVT - SCDs Dispo - ICU, family coming in for terminal extubation today at 1100 Critical Care Total Time*: 35 Minutes  Violeta Gelinas, MD, MPH, FACS Trauma & General Surgery Use [email or web address] to contact on call provider  05/20/2019  *Care during the described time interval was provided by me. I have reviewed this patient's available data, including medical history, events of note, physical examination and test results as part of my evaluation.

## 2019-05-31 NOTE — Progress Notes (Signed)
Nutrition Brief Note  Chart reviewed. Noted plan for terminal extubation today.  Per plan pt will transition to comfort care.  No further nutrition interventions planned at this time.  Please re-consult as needed.   Cammy Copa., RD, LDN, CNSC See AMiON for contact information
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
## 2019-05-31 NOTE — Progress Notes (Signed)
RT extubated patient with RN at bedside per MD order and family wishes for withdrawal.

## 2019-05-31 NOTE — Progress Notes (Signed)
Patient ID: Megyn Freund, female   DOB: 11/30/1975, 44 y.o.   MRN: [id number] The Palliative Team and myself met with Corra's three children at the bedside with an interpreter. I updated them on her condition including her further decline over the past 24h, now requiring levophed to maintain BP. There are some other family members downstairs. The plan will be to allow them to visit and then proceed with terminal extubation. I described the process. CDS is here and will now speak with the children.  Burke Thompson, MD, MPH, FACS Please use [email or web address] to contact on call provider  

## 2019-05-31 NOTE — Progress Notes (Addendum)
Patient ID: Susan Mcconnell, female   DOB: [birth year], 44 y.o.   MRN: [id number]  This NP visited patient at the bedside as a follow up for palliative medicine needs and emotional support.  In collaboration with Christina/licensed clinical social worker with [employer],  a visit is planned today for all 3 children to visit patient at bedside.  Interpreter secured.  Dr. Grandville Silos and nursing notified.  I met with 3 children, support persons from social services and their church support person/Fabian Reed.    We had a conversation regarding the patient's current medical situation, the seriousness of her brain injury, and the limited treatment options to prolong life.  Patient has a grim prognosis, expect hospital death  Education offered on the significant medical/physical decline in the past 12 hours.  Under Dr. Biagio Borg guidance, decision has been made for extubation today shifting focus of care to comfort, quality and dignity.  Educated family on the natural trajectory and expectations at end of life.  Questions and concerns addressed      Discussed with Dr Grandville Silos     Total time spent on the unit was 35 minutes   Wadie Lessen NP  Palliative Medicine Team Team Phone # [phone] Pager [phone]

## 2019-05-31 DEATH — deceased

## 2019-07-18 IMAGING — CT CT HEAD W/O CM
3 series · 16 of 47 positions shown, 19 images · non-contrast
Comparison: None.

CLINICAL DATA: 43 y/o F; alcohol intoxication, confusion,
combative.

EXAM:
CT HEAD WITHOUT CONTRAST
TECHNIQUE: Contiguous axial images were obtained from the base of the skull
through the vertex without intravenous contrast.

[Series 3: head wo · axial · 0.47mm/px · z∈[-74,+56]mm · 10 of 32 slices shown, 13 images]
[im 3/32  brain]
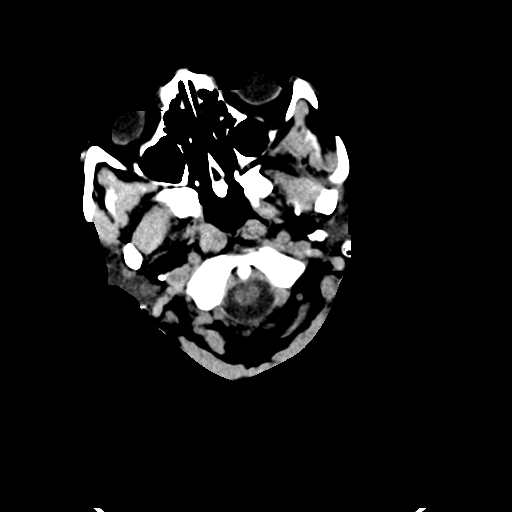
[im 3/32  bone]
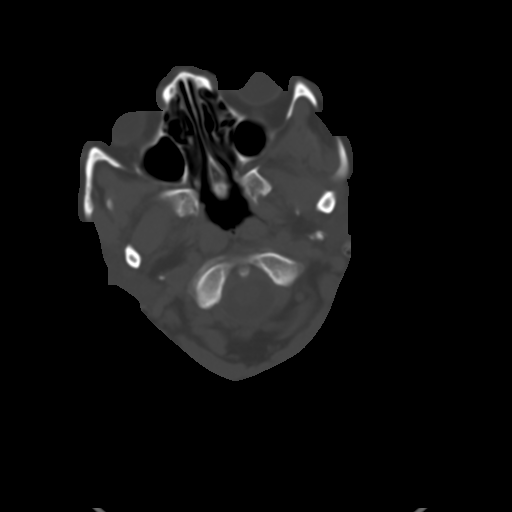
[im 6/32  brain]
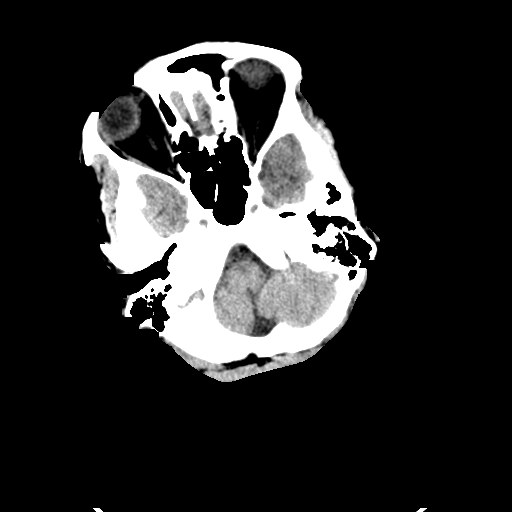
[im 9/32  brain]
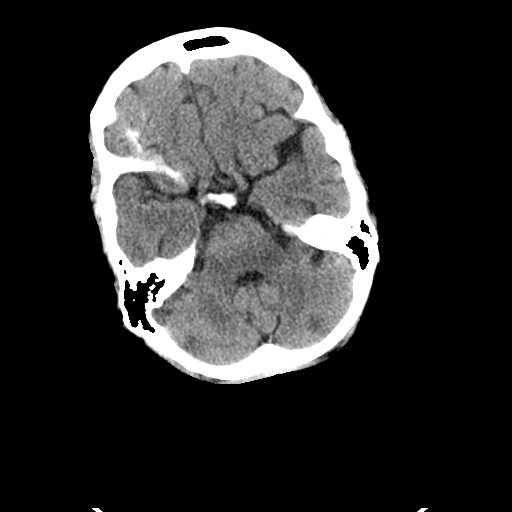
[im 11/32  brain]
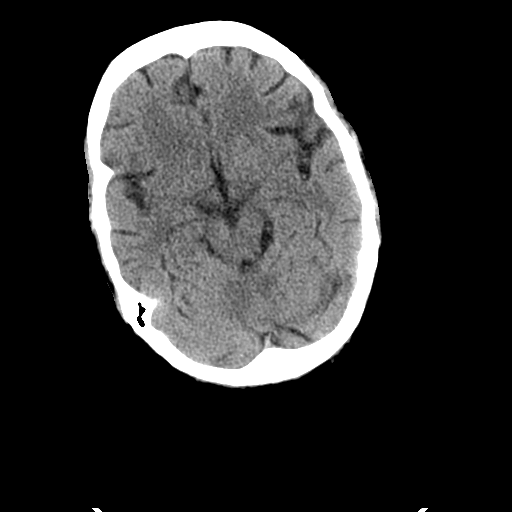
[im 14/32  brain]
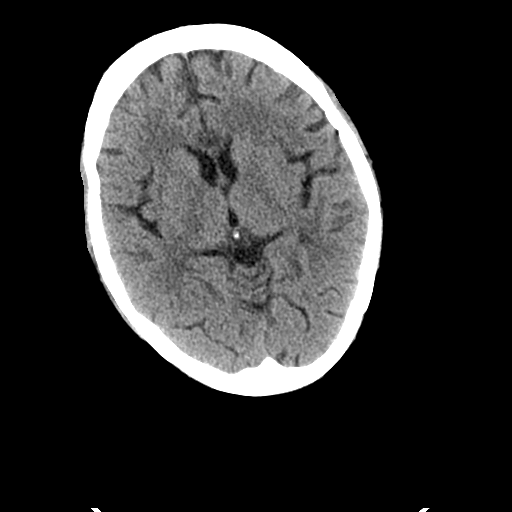
[im 14/32  bone]
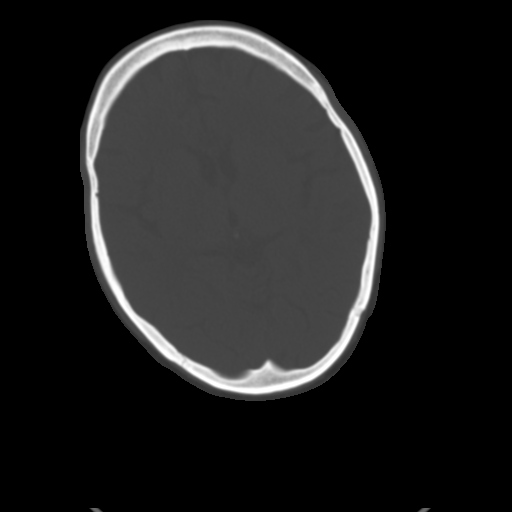
[im 18/32  brain]
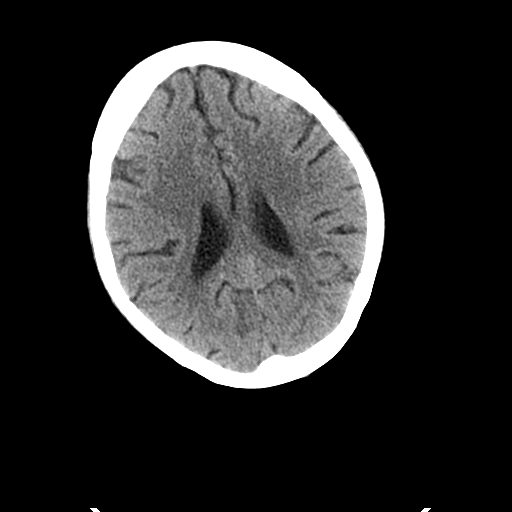
[im 21/32  brain]
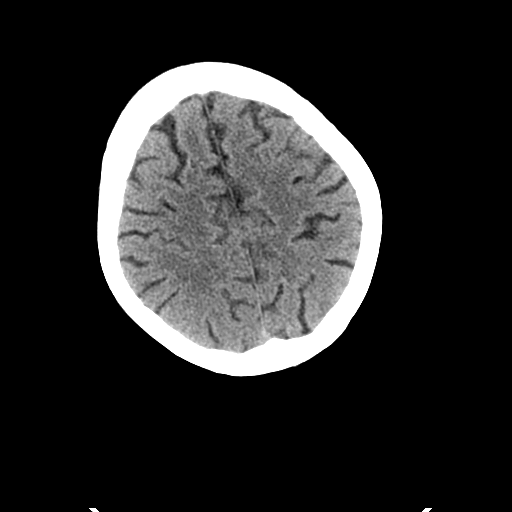
[im 24/32  brain]
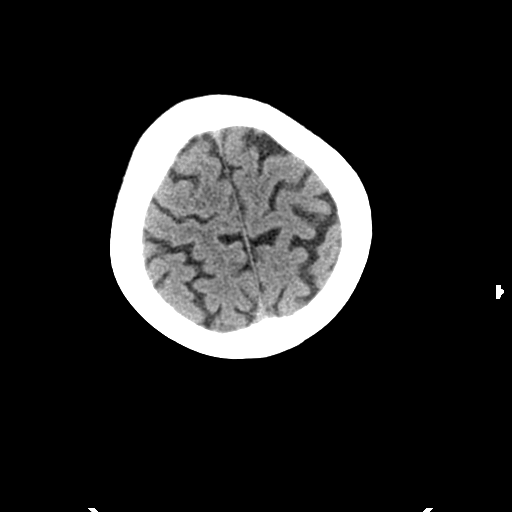
[im 26/32  brain]
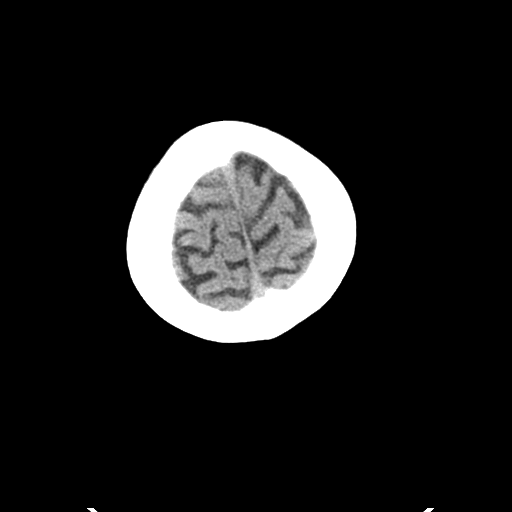
[im 26/32  bone]
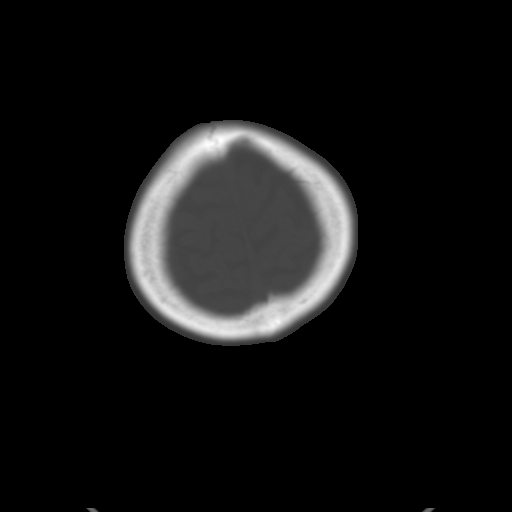
[im 29/32  brain]
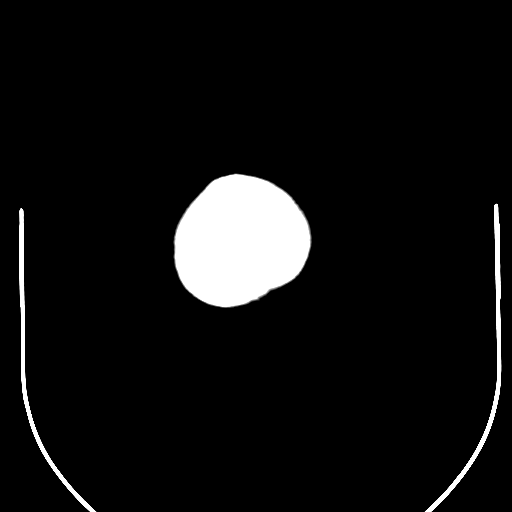

[Series 6: coronal soft tissue · coronal · 0.30mm/px · 3 of 69 slices shown]
[im 23/69  brain]
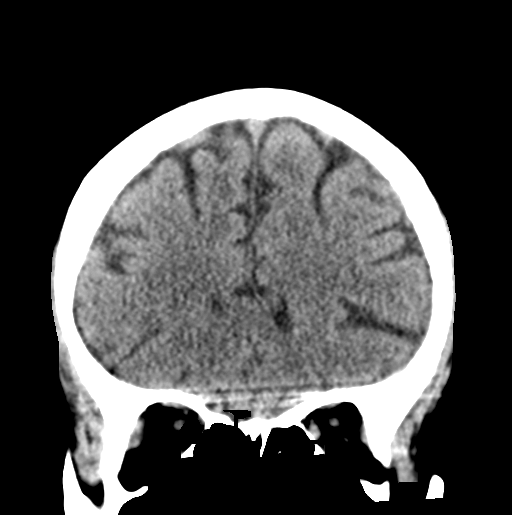
[im 31/69  brain]
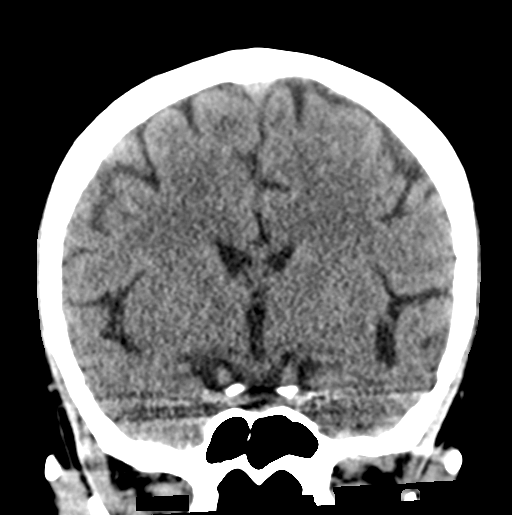
[im 38/69  brain]
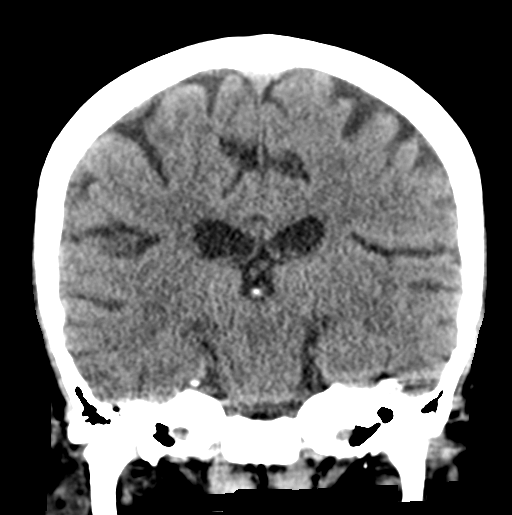

[Series 7: sagittal soft tissue · sagittal · 0.31mm/px · 3 of 52 slices shown]
[im 18/52  brain]
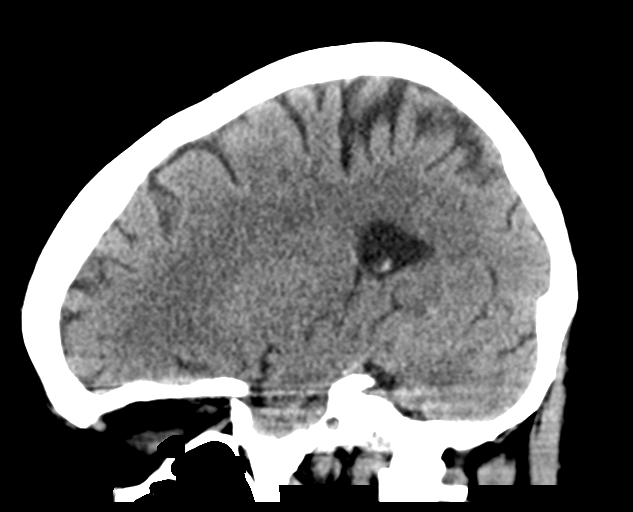
[im 26/52  brain]
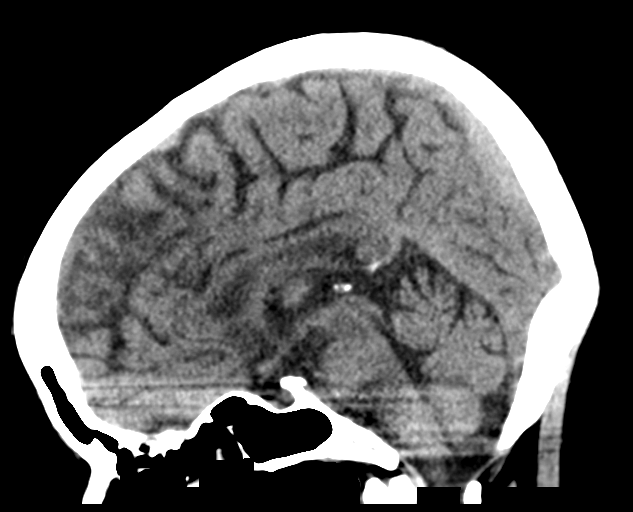
[im 35/52  brain]
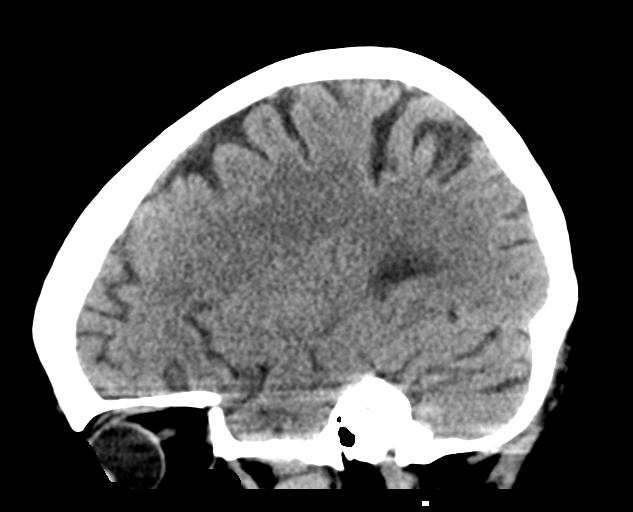

[16 of 47 positions shown; findings below may reference images not displayed]

FINDINGS: Brain: No evidence of acute infarction, hemorrhage, hydrocephalus,
extra-axial collection or mass lesion/mass effect.

Vascular: No hyperdense vessel or unexpected calcification.

Skull: Normal. Negative for fracture or focal lesion.

Sinuses/Orbits: No acute finding.

Other: None.
IMPRESSION: No acute intracranial abnormality identified. Unremarkable CT of the
head.

## 2020-05-09 IMAGING — CR DG KNEE COMPLETE 4+V*L*
4 series · 4 of 4 positions shown · non-contrast
Comparison: Left tibia/fibular radiographs 02/03/2018

CLINICAL DATA: Knee pain, injury 2 days prior

EXAM:
LEFT KNEE - COMPLETE 4+ VIEW

[t knee ap left]
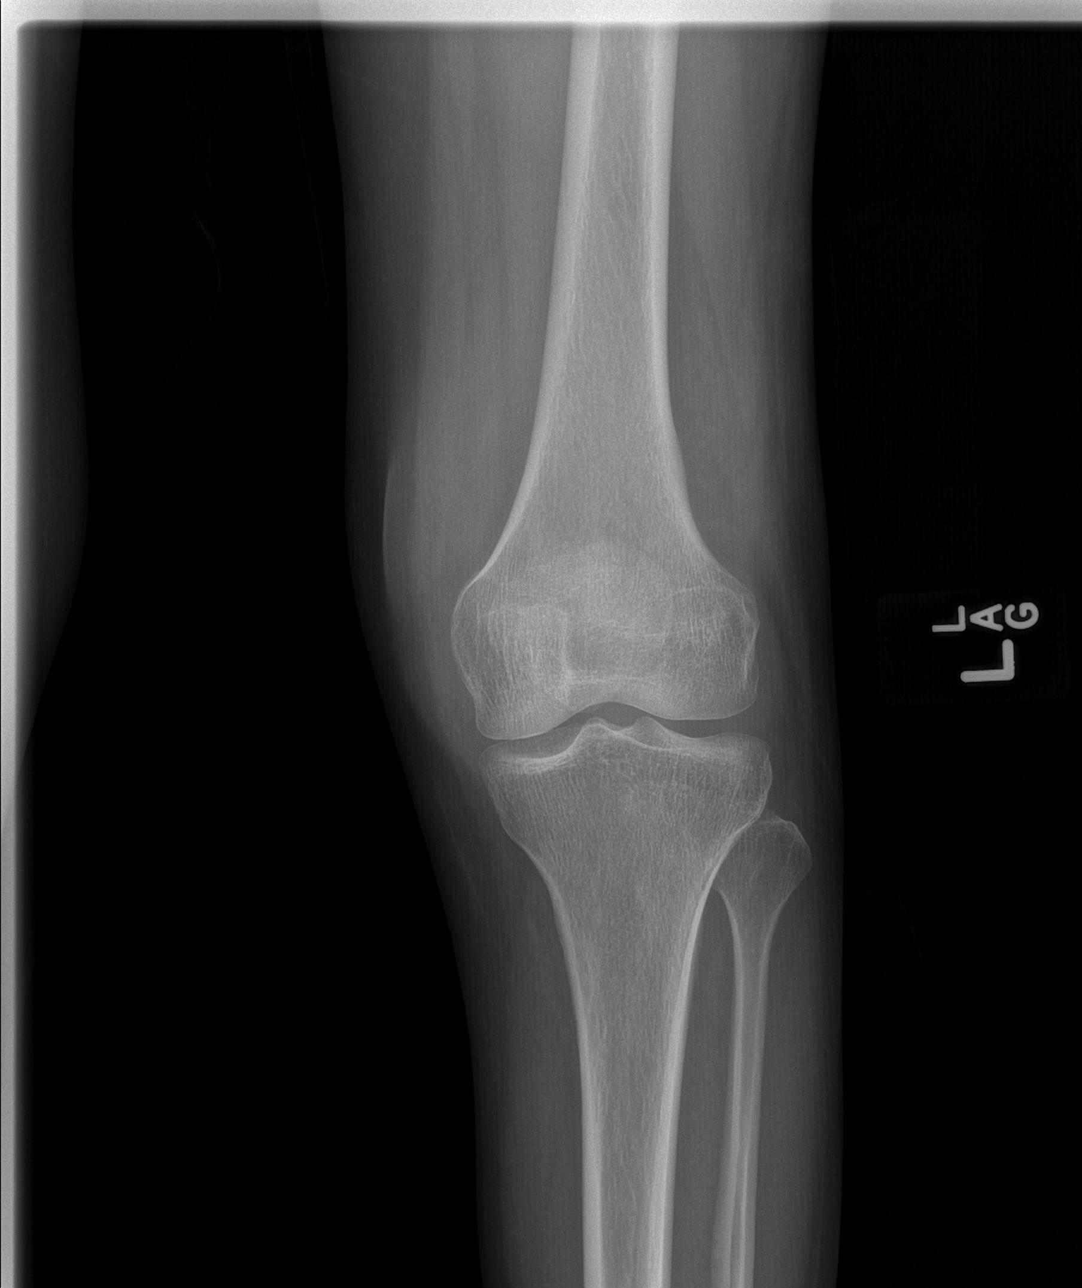

[t knee obl left (1 of 2)]
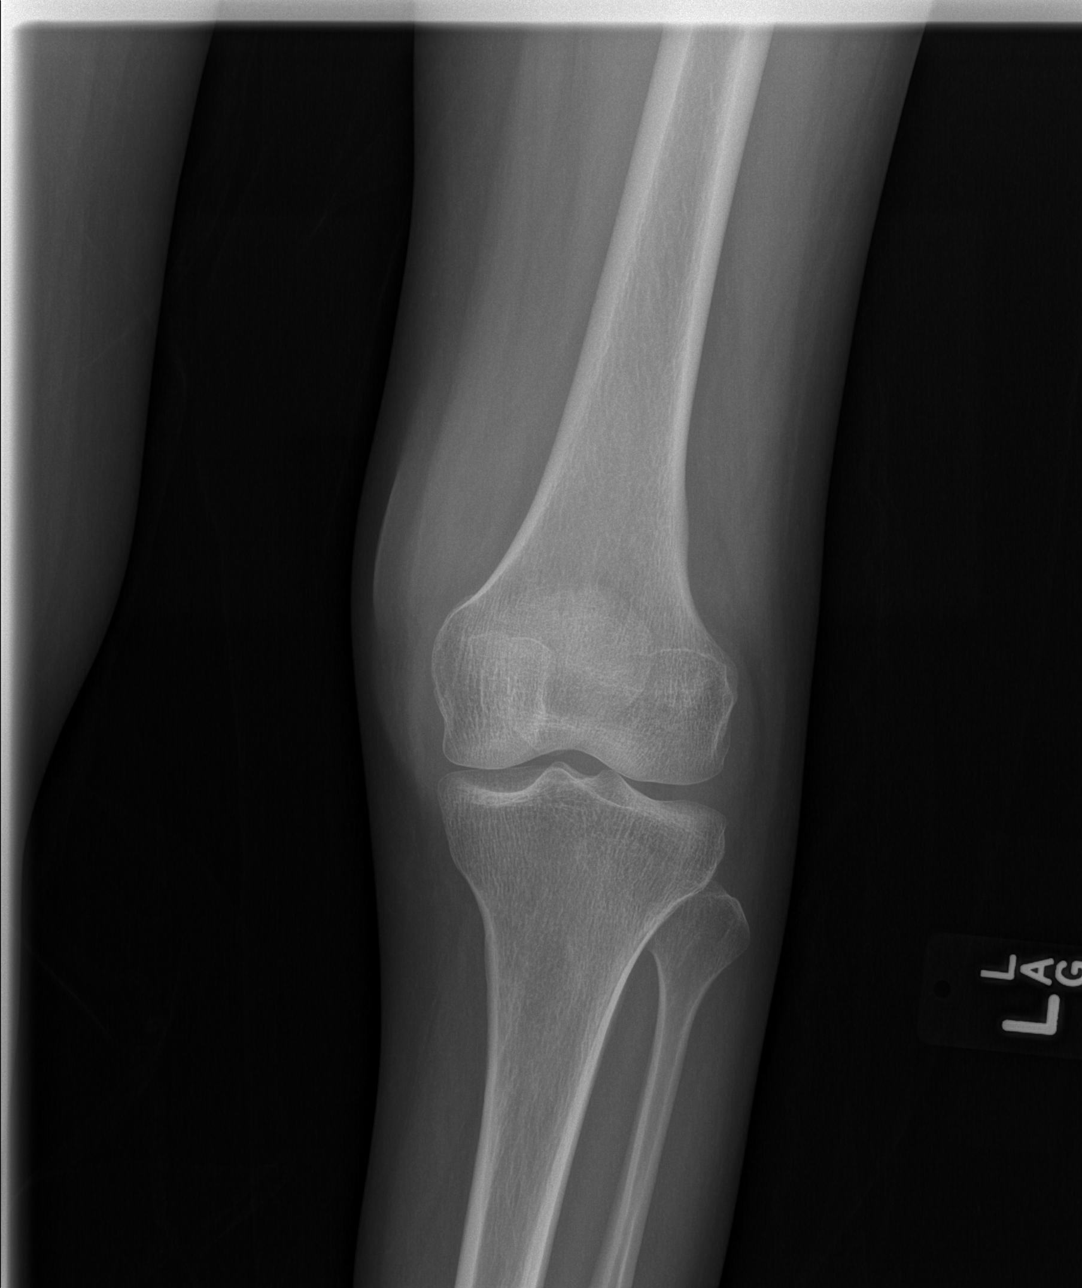

[t knee obl left (2 of 2)]
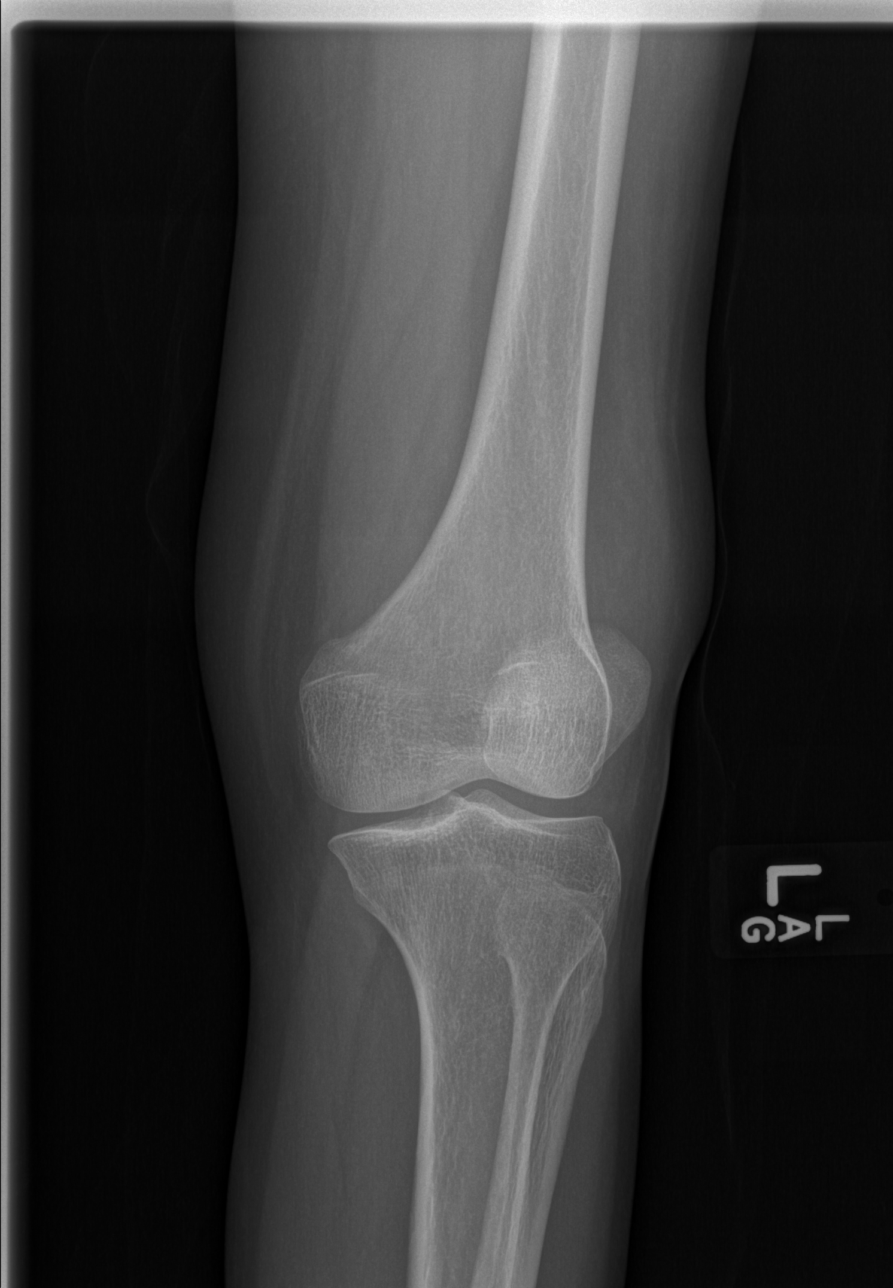

[t knee lat left]
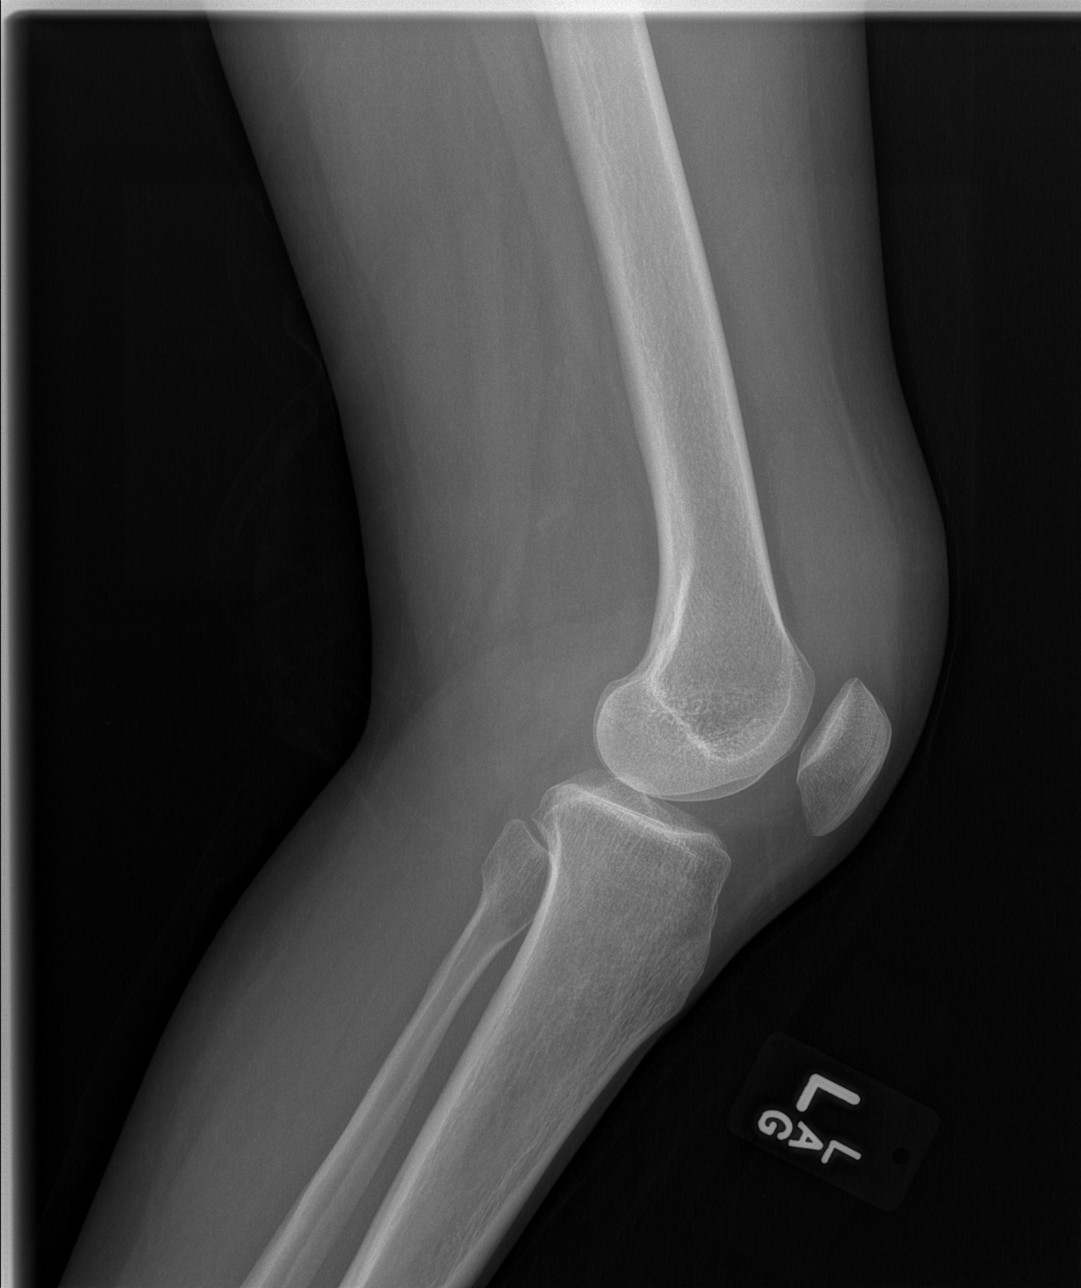

[4 of 4 positions shown; findings below may reference images not displayed]

FINDINGS: Mild anterior soft tissue swelling with a large left knee joint
effusion. No acute fracture or traumatic malalignment is seen. No
suspicious osseous lesions.
IMPRESSION: Large left knee joint effusion with mild anterior soft tissue
swelling. No acute osseous abnormality.
